# Patient Record
Sex: Female | Born: 1975 | Race: White | Hispanic: No | Smoking: Former smoker
Health system: Southern US, Community
[De-identification: ages and names within clinical notes are randomized; demographics above are authoritative.]

## PROBLEM LIST (undated history)

## (undated) DIAGNOSIS — M436 Torticollis: Secondary | ICD-10-CM

## (undated) DIAGNOSIS — Z973 Presence of spectacles and contact lenses: Secondary | ICD-10-CM

## (undated) DIAGNOSIS — F329 Major depressive disorder, single episode, unspecified: Secondary | ICD-10-CM

## (undated) DIAGNOSIS — IMO0002 Reserved for concepts with insufficient information to code with codable children: Secondary | ICD-10-CM

## (undated) DIAGNOSIS — I341 Nonrheumatic mitral (valve) prolapse: Secondary | ICD-10-CM

## (undated) DIAGNOSIS — I82409 Acute embolism and thrombosis of unspecified deep veins of unspecified lower extremity: Secondary | ICD-10-CM

## (undated) DIAGNOSIS — Z800 Family history of malignant neoplasm of digestive organs: Secondary | ICD-10-CM

## (undated) DIAGNOSIS — J45909 Unspecified asthma, uncomplicated: Secondary | ICD-10-CM

## (undated) DIAGNOSIS — D689 Coagulation defect, unspecified: Secondary | ICD-10-CM

## (undated) DIAGNOSIS — A6000 Herpesviral infection of urogenital system, unspecified: Secondary | ICD-10-CM

## (undated) DIAGNOSIS — M329 Systemic lupus erythematosus, unspecified: Secondary | ICD-10-CM

## (undated) DIAGNOSIS — G2581 Restless legs syndrome: Secondary | ICD-10-CM

## (undated) DIAGNOSIS — Z8041 Family history of malignant neoplasm of ovary: Secondary | ICD-10-CM

## (undated) DIAGNOSIS — Z789 Other specified health status: Secondary | ICD-10-CM

## (undated) DIAGNOSIS — F32A Depression, unspecified: Secondary | ICD-10-CM

## (undated) DIAGNOSIS — F419 Anxiety disorder, unspecified: Secondary | ICD-10-CM

## (undated) HISTORY — PX: NASAL SEPTUM SURGERY: SHX37

## (undated) HISTORY — DX: Depression, unspecified: F32.A

## (undated) HISTORY — DX: Anxiety disorder, unspecified: F41.9

## (undated) HISTORY — DX: Unspecified asthma, uncomplicated: J45.909

## (undated) HISTORY — DX: Herpesviral infection of urogenital system, unspecified: A60.00

## (undated) HISTORY — PX: ENDOMETRIAL BIOPSY: SHX622

## (undated) HISTORY — DX: Major depressive disorder, single episode, unspecified: F32.9

## (undated) HISTORY — PX: ABLATION: SHX5711

## (undated) HISTORY — DX: Family history of malignant neoplasm of digestive organs: Z80.0

## (undated) HISTORY — DX: Coagulation defect, unspecified: D68.9

## (undated) HISTORY — DX: Family history of malignant neoplasm of ovary: Z80.41

## (undated) HISTORY — DX: Reserved for concepts with insufficient information to code with codable children: IMO0002

## (undated) HISTORY — DX: Systemic lupus erythematosus, unspecified: M32.9

## (undated) HISTORY — DX: Acute embolism and thrombosis of unspecified deep veins of unspecified lower extremity: I82.409

---

## 1993-01-10 HISTORY — PX: NOSE SURGERY: SHX723

## 1993-01-10 HISTORY — PX: TONSILLECTOMY: SUR1361

## 2002-01-10 DIAGNOSIS — I82409 Acute embolism and thrombosis of unspecified deep veins of unspecified lower extremity: Secondary | ICD-10-CM

## 2002-01-10 HISTORY — DX: Acute embolism and thrombosis of unspecified deep veins of unspecified lower extremity: I82.409

## 2007-01-08 ENCOUNTER — Ambulatory Visit: Payer: Self-pay | Admitting: Obstetrics & Gynecology

## 2007-01-11 HISTORY — PX: TUBAL LIGATION: SHX77

## 2007-04-03 ENCOUNTER — Ambulatory Visit: Payer: Self-pay | Admitting: Obstetrics & Gynecology

## 2007-05-16 ENCOUNTER — Ambulatory Visit: Payer: Self-pay | Admitting: Family Medicine

## 2007-05-18 ENCOUNTER — Emergency Department: Payer: Self-pay | Admitting: Unknown Physician Specialty

## 2007-05-18 ENCOUNTER — Other Ambulatory Visit: Payer: Self-pay

## 2007-06-25 ENCOUNTER — Ambulatory Visit: Payer: Self-pay | Admitting: Obstetrics & Gynecology

## 2007-06-28 ENCOUNTER — Ambulatory Visit: Payer: Self-pay | Admitting: Obstetrics & Gynecology

## 2009-01-10 HISTORY — PX: COLONOSCOPY: SHX174

## 2009-02-21 ENCOUNTER — Emergency Department: Payer: Self-pay | Admitting: Emergency Medicine

## 2009-10-02 ENCOUNTER — Ambulatory Visit: Payer: Self-pay | Admitting: General Surgery

## 2009-10-05 LAB — PATHOLOGY REPORT

## 2011-05-13 ENCOUNTER — Emergency Department: Payer: Self-pay | Admitting: Emergency Medicine

## 2011-05-13 LAB — CBC
HCT: 39.2 % (ref 35.0–47.0)
MCH: 32.2 pg (ref 26.0–34.0)
Platelet: 265 10*3/uL (ref 150–440)
RBC: 4.02 10*6/uL (ref 3.80–5.20)

## 2011-05-13 LAB — URINALYSIS, COMPLETE
Bacteria: NONE SEEN
Bilirubin,UR: NEGATIVE
Blood: NEGATIVE
Glucose,UR: NEGATIVE mg/dL (ref 0–75)
Ketone: NEGATIVE
Nitrite: NEGATIVE
Ph: 5 (ref 4.5–8.0)
Protein: NEGATIVE
RBC,UR: 4 /HPF (ref 0–5)
WBC UR: 1 /HPF (ref 0–5)

## 2011-05-13 LAB — COMPREHENSIVE METABOLIC PANEL
Albumin: 3.8 g/dL (ref 3.4–5.0)
BUN: 10 mg/dL (ref 7–18)
Chloride: 105 mmol/L (ref 98–107)
Creatinine: 0.77 mg/dL (ref 0.60–1.30)
EGFR (African American): >60
EGFR (Non-African Amer.): >60
Osmolality: 276 (ref 275–301)
Potassium: 3.7 mmol/L (ref 3.5–5.1)
SGPT (ALT): 14 U/L
Total Protein: 7.3 g/dL (ref 6.4–8.2)

## 2011-05-14 LAB — DRUG SCREEN, URINE
Amphetamines, Ur Screen: NEGATIVE (ref ?–1000)
Barbiturates, Ur Screen: NEGATIVE (ref ?–200)
Benzodiazepine, Ur Scrn: NEGATIVE (ref ?–200)
Cannabinoid 50 Ng, Ur ~~LOC~~: NEGATIVE (ref ?–50)
Cocaine Metabolite,Ur ~~LOC~~: NEGATIVE (ref ?–300)
Methadone, Ur Screen: NEGATIVE (ref ?–300)
Opiate, Ur Screen: NEGATIVE (ref ?–300)
Tricyclic, Ur Screen: NEGATIVE (ref ?–1000)

## 2011-05-14 LAB — PREGNANCY, URINE: Pregnancy Test, Urine: NEGATIVE m[IU]/mL

## 2012-12-21 ENCOUNTER — Ambulatory Visit: Payer: Self-pay | Admitting: Family Medicine

## 2013-05-11 ENCOUNTER — Ambulatory Visit: Payer: Self-pay | Admitting: Specialist

## 2013-10-03 DIAGNOSIS — M359 Systemic involvement of connective tissue, unspecified: Secondary | ICD-10-CM | POA: Insufficient documentation

## 2013-10-14 ENCOUNTER — Ambulatory Visit: Payer: Self-pay | Admitting: Gastroenterology

## 2013-10-14 LAB — HM COLONOSCOPY: HM COLON: NORMAL

## 2013-10-16 LAB — PATHOLOGY REPORT

## 2014-12-03 ENCOUNTER — Ambulatory Visit (INDEPENDENT_AMBULATORY_CARE_PROVIDER_SITE_OTHER): Payer: BC Managed Care – PPO | Admitting: Family Medicine

## 2014-12-03 ENCOUNTER — Encounter: Payer: Self-pay | Admitting: Family Medicine

## 2014-12-03 VITALS — BP 116/84 | HR 72 | Temp 98.3°F | Resp 16 | Ht 67.0 in | Wt 195.0 lb

## 2014-12-03 DIAGNOSIS — Z8679 Personal history of other diseases of the circulatory system: Secondary | ICD-10-CM | POA: Insufficient documentation

## 2014-12-03 DIAGNOSIS — G2581 Restless legs syndrome: Secondary | ICD-10-CM

## 2014-12-03 DIAGNOSIS — Z8659 Personal history of other mental and behavioral disorders: Secondary | ICD-10-CM

## 2014-12-03 DIAGNOSIS — D6862 Lupus anticoagulant syndrome: Secondary | ICD-10-CM | POA: Insufficient documentation

## 2014-12-03 DIAGNOSIS — Z86718 Personal history of other venous thrombosis and embolism: Secondary | ICD-10-CM | POA: Insufficient documentation

## 2014-12-03 DIAGNOSIS — Z8709 Personal history of other diseases of the respiratory system: Secondary | ICD-10-CM | POA: Insufficient documentation

## 2014-12-03 DIAGNOSIS — Z8669 Personal history of other diseases of the nervous system and sense organs: Secondary | ICD-10-CM | POA: Insufficient documentation

## 2014-12-03 DIAGNOSIS — I82409 Acute embolism and thrombosis of unspecified deep veins of unspecified lower extremity: Secondary | ICD-10-CM | POA: Insufficient documentation

## 2014-12-03 DIAGNOSIS — Z862 Personal history of diseases of the blood and blood-forming organs and certain disorders involving the immune mechanism: Secondary | ICD-10-CM | POA: Insufficient documentation

## 2014-12-03 MED ORDER — GABAPENTIN 100 MG PO CAPS: 200.0000 mg | ORAL_CAPSULE | Freq: Every day | ORAL | Status: DC

## 2014-12-03 NOTE — Progress Notes (Signed)
Subjective:    Patient ID: Heidi [name], female    DOB: 1975-03-21, 39 y.o.   MRN: [id number]  HPI  Restless Leg Syndrome Pt is currently taking Gabapentin  2 tablets qhs for this problem. Pt has been taking this medication for 3 years. Pt reports significant relief if sx on medication, and reports good tolerance of treatment. Dr. Lavenia Atlas originally prescribed the Gabapentin, but the co-pay to see specialist is too expensive. Pt is requesting that PCP fill this medication. It does work for her RLS.   Does not have Lupus clinically. Doing well.   Does see psychiatry. Mood is stable. Unclear if has had labs done recently.   Review of Systems  Constitutional: Negative for fever, chills, diaphoresis, activity change, appetite change, fatigue and unexpected weight change.  Respiratory: Negative for cough, shortness of breath and wheezing.   Cardiovascular: Negative for chest pain, palpitations and leg swelling.  Psychiatric/Behavioral: Negative.       Patient Active Problem List   Diagnosis Date Noted  . History of anemia 12/03/2014  . History of asthma 12/03/2014  . Deep vein thrombosis (DVT) (HCC) 12/03/2014  . H/O: depression 12/03/2014  . Lupus anticoagulant disorder (HCC) 12/03/2014  . History of migraine headaches 12/03/2014  . H/O cardiovascular disorder 12/03/2014  . Disorder of connective tissue (HCC) 10/03/2013   Past Medical History  Diagnosis Date  . Asthma   . Depression   . Clotting disorder (HCC)     DVT   Current Outpatient Prescriptions on File Prior to Visit  Medication Sig  . gabapentin (NEURONTIN) 100 MG capsule Take 100 mg by mouth 2 (two) times daily.  Marland Kitchen lamoTRIgine (LAMICTAL) 100 MG tablet Take 100 mg by mouth 2 (two) times daily.  . sertraline (ZOLOFT) 100 MG tablet Take 100 mg by mouth. BID   No current facility-administered medications on file prior to visit.   No Known Allergies Past Surgical History  Procedure Laterality Date    . Tubal ligation  2009  . Nose surgery  1995    deviated nose septum  . Tonsillectomy  1995   Social History   Social History  . Marital Status: Divorced    Spouse Name: N/A  . Number of Children: N/A  . Years of Education: N/A   Occupational History  . Not on file.   Social History Main Topics  . Smoking status: Former Smoker    Quit date: 01/09/2013  . Smokeless tobacco: Never Used  . Alcohol Use: Yes     Comment: occasional  . Drug Use: No  . Sexual Activity: Not on file   Other Topics Concern  . Not on file   Social History Narrative   Family History  Problem Relation Age of Onset  . Cancer Mother     colon, rectal  . Diabetes Father        Objective:   Physical Exam  Constitutional: She is oriented to person, place, and time. She appears well-developed and well-nourished.  Neurological: She is alert and oriented to person, place, and time.  Psychiatric: She has a normal mood and affect. Her behavior is normal. Judgment and thought content normal.  BP 116/84 mmHg  Pulse 72  Temp(Src) 98.3 F (36.8 C) (Oral)  Resp 16  Ht  (1.702 m)  Wt 195 lb (88.451 kg)  BMI 30.53 kg/m2  LMP 12/02/2014     Assessment & Plan:  1. Restless leg syndrome Stable.  New problem to this provider.  Diagnosed by rheumatology. Will refill medication as needed.  - gabapentin (NEURONTIN) 100 MG capsule; Take 2 capsules (200 mg total) by mouth at bedtime.  Dispense: 60 capsule; Refill: 11  2. H/O: depression Stable on medication. Recommend check with Gyn and Psychiatrist about what labs need to be done. Will order without seeing patient back.   Lorie PhenixNancy Altagracia Rone, MD

## 2014-12-22 ENCOUNTER — Ambulatory Visit: Payer: Self-pay | Admitting: General Surgery

## 2015-06-02 ENCOUNTER — Ambulatory Visit (INDEPENDENT_AMBULATORY_CARE_PROVIDER_SITE_OTHER): Payer: BC Managed Care – PPO | Admitting: Family Medicine

## 2015-06-02 ENCOUNTER — Encounter: Payer: Self-pay | Admitting: Family Medicine

## 2015-06-02 VITALS — BP 112/80 | HR 65 | Temp 98.2°F | Resp 16 | Wt 190.0 lb

## 2015-06-02 DIAGNOSIS — J4520 Mild intermittent asthma, uncomplicated: Secondary | ICD-10-CM | POA: Diagnosis not present

## 2015-06-02 DIAGNOSIS — J018 Other acute sinusitis: Secondary | ICD-10-CM | POA: Diagnosis not present

## 2015-06-02 DIAGNOSIS — G2581 Restless legs syndrome: Secondary | ICD-10-CM

## 2015-06-02 MED ORDER — AMOXICILLIN-POT CLAVULANATE 875-125 MG PO TABS: 1.0000 | ORAL_TABLET | Freq: Two times a day (BID) | ORAL | Status: DC

## 2015-06-02 MED ORDER — GABAPENTIN 100 MG PO CAPS: 200.0000 mg | ORAL_CAPSULE | Freq: Every day | ORAL | Status: DC

## 2015-06-02 NOTE — Progress Notes (Signed)
Patient ID: Heidi [name], female   DOB: 01/26/1975, 40 y.o.   MRN: [id number]       Patient: Heidi Blamerlizabeth Leaks Female    DOB: 06/30/1975   40 y.o.   MRN: [id number] Visit Date: 06/02/2015  Today's Provider: Lorie PhenixNancy Davey Bergsma, MD   Chief Complaint  Patient presents with  . URI   Subjective:    URI  This is a new problem. The current episode started in the past 7 days. The problem has been gradually worsening. There has been no fever. Associated symptoms include congestion, coughing, headaches, rhinorrhea (green yellow) and sinus pain. She has tried nothing for the symptoms.   Does think she has a sinus infection.  Now has colored mucus. Just not improving as far as sinus symptoms. Was wheezing a lot initially, but not recently.   Did have asthma as a kid.   Has not need an inhaler as a adult.    Restless leg is well controlled.       No Known Allergies Previous Medications   DICLOFENAC (VOLTAREN) 75 MG EC TABLET    Take 1 tablet by mouth 2 (two) times daily.   GABAPENTIN (NEURONTIN) 100 MG CAPSULE    Take 2 capsules (200 mg total) by mouth at bedtime.   LAMOTRIGINE (LAMICTAL) 100 MG TABLET    Take 100 mg by mouth 2 (two) times daily.   LURASIDONE HCL (LATUDA) 60 MG TABS    Take 1 tablet by mouth daily.    SERTRALINE (ZOLOFT) 100 MG TABLET    Take 100 mg by mouth daily.    VALACYCLOVIR (VALTREX) 1000 MG TABLET    TAKE 1 TABLET (1,000 MG) BY ORAL ROUTE ONCE DAILY FOR 30 DAYS    Review of Systems  HENT: Positive for congestion and rhinorrhea (green yellow).   Respiratory: Positive for cough.   Neurological: Positive for headaches.    Social History  Substance Use Topics  . Smoking status: Former Smoker    Quit date: 01/09/2013  . Smokeless tobacco: Never Used  . Alcohol Use: Yes     Comment: occasional   Objective:   BP 112/80 mmHg  Pulse 65  Temp(Src) 98.2 F (36.8 C) (Oral)  Resp 16  Wt 190 lb (86.183 kg)  SpO2 98%  Physical Exam  Constitutional: She appears  well-developed and well-nourished.  HENT:  Head: Normocephalic and atraumatic.  Right Ear: External ear normal.  Left Ear: External ear normal.  Nose: Mucosal edema and rhinorrhea present.  Mouth/Throat: Oropharynx is clear and moist.  Cardiovascular: Normal rate, regular rhythm and normal heart sounds.   Pulmonary/Chest: Effort normal. She has wheezes.  inspiratory       Assessment & Plan:     1. Other acute sinusitis New problem. Worsening. Patient started on amoxicillin-clavulanate 875-125 mg as below. - amoxicillin-clavulanate (AUGMENTIN) 875-125 MG tablet; Take 1 tablet by mouth 2 (two) times daily.  Dispense: 14 tablet; Refill: 0  2. Reactive airway disease, mild intermittent, uncomplicated New problem. Improved. Patient advised to call if symptoms are worsening or not improving.  3. Restless leg syndrome Stable. Patient advised to continue current medication and follow-up in the fall with Dr. Sherrie MustacheFisher.  - gabapentin (NEURONTIN) 100 MG capsule; Take 2 capsules (200 mg total) by mouth at bedtime.  Dispense: 60 capsule; Refill: 5     Patient seen and examined by Dr. Leo GrosserNancy J.. Lafonda Patron, and note scribed by Liz BeachSulibeya S. Dimas, CMA.  I have reviewed the document for accuracy and completeness and I  agree with above. - Jerrell Belfast, MD   Margarita Rana, MD  [facility]

## 2015-11-02 ENCOUNTER — Ambulatory Visit: Payer: BC Managed Care – PPO | Admitting: Family Medicine

## 2015-11-17 ENCOUNTER — Encounter: Payer: Self-pay | Admitting: Family Medicine

## 2015-11-17 ENCOUNTER — Ambulatory Visit (INDEPENDENT_AMBULATORY_CARE_PROVIDER_SITE_OTHER): Payer: BC Managed Care – PPO | Admitting: Family Medicine

## 2015-11-17 VITALS — BP 120/86 | HR 70 | Temp 98.1°F | Resp 16 | Wt 183.0 lb

## 2015-11-17 DIAGNOSIS — G2581 Restless legs syndrome: Secondary | ICD-10-CM

## 2015-11-17 MED ORDER — GABAPENTIN 100 MG PO CAPS: 200.0000 mg | ORAL_CAPSULE | Freq: Every day | ORAL | 5 refills | Status: DC

## 2015-11-17 NOTE — Progress Notes (Signed)
Patient: Heidi [name] Female    DOB: 01/04/1976   40 y.o.   MRN: [id number] Visit Date: 11/17/2015  Today's Provider: Mila Merryonald Illyria Sobocinski, MD   Chief Complaint  Patient presents with  . Follow-up    Restless leg syndrome and history of Depression   Subjective:    HPI  This is a previous patient of Dr. Elease HashimotoMaloney present today as new patient to me to establish care and follow up on chronic medical problems.   Follow up Restless leg syndrome:  Patient was last seen by Dr. Elease HashimotoMaloney for this problem 5 months ago and condition was stable; no changes were made. Patient is currently taking Gabapentin and reports good compliance with treatment, good tolerance and good symptom control.   History of Depression:  Patient was last seen for this problem 5 months ago and no changes were made. Patient was stable on medication and was advised to check with GYN and Psychiatry about what labs need to be done. Patient reports good compliance with treatment, good tolerance and good symptom control. Patient has [place] every 4-6 months and is doing well.     No Known Allergies   Current Outpatient Prescriptions:  .  diclofenac (VOLTAREN) 75 MG EC tablet, Take 1 tablet by mouth 2 (two) times daily., Disp: , Rfl:  .  gabapentin (NEURONTIN) 100 MG capsule, Take 2 capsules (200 mg total) by mouth at bedtime., Disp: 60 capsule, Rfl: 5 .  lamoTRIgine (LAMICTAL) 100 MG tablet, Take 100 mg by mouth 2 (two) times daily., Disp: , Rfl:  .  Lurasidone HCl (LATUDA) 60 MG TABS, Take 0.5 tablets by mouth daily. , Disp: , Rfl:  .  sertraline (ZOLOFT) 100 MG tablet, Take 100 mg by mouth daily. , Disp: , Rfl:  .  valACYclovir (VALTREX) 1000 MG tablet, TAKE 1 TABLET (1,000 MG) BY ORAL ROUTE ONCE DAILY FOR 30 DAYS, Disp: , Rfl: 0  Review of Systems  Constitutional: Negative for appetite change, chills, fatigue and fever.  Respiratory: Negative for chest tightness and shortness of  breath.   Cardiovascular: Negative for chest pain and palpitations.  Gastrointestinal: Negative for abdominal pain, nausea and vomiting.  Musculoskeletal: Negative for arthralgias.       Restless legs  Neurological: Negative for dizziness and weakness.    Social History  Substance Use Topics  . Smoking status: Former Smoker    Quit date: 01/09/2013  . Smokeless tobacco: Never Used  . Alcohol use Yes     Comment: occasional   Objective:   BP 120/86 (BP Location: Right Arm, Patient Position: Sitting, Cuff Size: Large)   Pulse 70   Temp 98.1 F (36.7 C) (Oral)   Resp 16   Wt 183 lb (83 kg)   SpO2 99% Comment: room air  BMI 28.66 kg/m   Physical Exam   General Appearance:    Alert, cooperative, no distress  Eyes:    PERRL, conjunctiva/corneas clear, EOM's intact       Lungs:     Clear to auscultation bilaterally, respirations unlabored  Heart:    Regular rate and rhythm  Neurologic:   Awake, alert, oriented x 3. No apparent focal neurological           defect.           Assessment & Plan:     1. Restless leg syndrome Well controlled on current dose of gabapentin.  - gabapentin (NEURONTIN) 100 MG capsule;  Take 2 capsules (200 mg total) by mouth at bedtime.  Dispense: 60 capsule; Refill: 5       Mila Merryonald Jyl Chico, MD  [facility]

## 2016-03-28 ENCOUNTER — Ambulatory Visit: Payer: Self-pay | Admitting: Obstetrics and Gynecology

## 2016-04-05 ENCOUNTER — Other Ambulatory Visit: Payer: Self-pay | Admitting: Obstetrics and Gynecology

## 2016-04-05 NOTE — Telephone Encounter (Signed)
[facility] on [address]

## 2016-04-05 NOTE — Telephone Encounter (Signed)
Pt called and reschedule appt with AMS for 05/25/16. Pt states she may need refills on prescription before she can make it to schedule appt. [phone]

## 2016-04-05 NOTE — Telephone Encounter (Signed)
I called patient and left her a message regarding I need to know which medications she needs refills of. Pt aware she can call back and leave information with receptionist.

## 2016-05-19 ENCOUNTER — Encounter: Payer: Self-pay | Admitting: Obstetrics and Gynecology

## 2016-05-25 ENCOUNTER — Ambulatory Visit (INDEPENDENT_AMBULATORY_CARE_PROVIDER_SITE_OTHER): Payer: BC Managed Care – PPO | Admitting: Obstetrics and Gynecology

## 2016-05-25 ENCOUNTER — Encounter: Payer: Self-pay | Admitting: Obstetrics and Gynecology

## 2016-05-25 DIAGNOSIS — Z124 Encounter for screening for malignant neoplasm of cervix: Secondary | ICD-10-CM

## 2016-05-25 DIAGNOSIS — Z1239 Encounter for other screening for malignant neoplasm of breast: Secondary | ICD-10-CM

## 2016-05-25 DIAGNOSIS — Z01419 Encounter for gynecological examination (general) (routine) without abnormal findings: Secondary | ICD-10-CM

## 2016-05-25 DIAGNOSIS — Z1231 Encounter for screening mammogram for malignant neoplasm of breast: Secondary | ICD-10-CM

## 2016-05-25 MED ORDER — VALACYCLOVIR HCL 1 G PO TABS: 1000.0000 mg | ORAL_TABLET | Freq: Every day | ORAL | 11 refills | Status: DC

## 2016-05-25 NOTE — Patient Instructions (Signed)
Preventive Care 18-39 Years, Female Preventive care refers to lifestyle choices and visits with your health care provider that can promote health and wellness. What does preventive care include?  A yearly physical exam. This is also called an annual well check.  Dental exams once or twice a year.  Routine eye exams. Ask your health care provider how often you should have your eyes checked.  Personal lifestyle choices, including:  Daily care of your teeth and gums.  Regular physical activity.  Eating a healthy diet.  Avoiding tobacco and drug use.  Limiting alcohol use.  Practicing safe sex.  Taking vitamin and mineral supplements as recommended by your health care provider. What happens during an annual well check? The services and screenings done by your health care provider during your annual well check will depend on your age, overall health, lifestyle risk factors, and family history of disease. Counseling  Your health care provider may ask you questions about your:  Alcohol use.  Tobacco use.  Drug use.  Emotional well-being.  Home and relationship well-being.  Sexual activity.  Eating habits.  Work and work environment.  Method of birth control.  Menstrual cycle.  Pregnancy history. Screening  You may have the following tests or measurements:  Height, weight, and BMI.  Diabetes screening. This is done by checking your blood sugar (glucose) after you have not eaten for a while (fasting).  Blood pressure.  Lipid and cholesterol levels. These may be checked every 5 years starting at age 20.  Skin check.  Hepatitis C blood test.  Hepatitis B blood test.  Sexually transmitted disease (STD) testing.  BRCA-related cancer screening. This may be done if you have a family history of breast, ovarian, tubal, or peritoneal cancers.  Pelvic exam and Pap test. This may be done every 3 years starting at age 21. Starting at age 30, this may be done every 5  years if you have a Pap test in combination with an HPV test. Discuss your test results, treatment options, and if necessary, the need for more tests with your health care provider. Vaccines  Your health care provider may recommend certain vaccines, such as:  Influenza vaccine. This is recommended every year.  Tetanus, diphtheria, and acellular pertussis (Tdap, Td) vaccine. You may need a Td booster every 10 years.  Varicella vaccine. You may need this if you have not been vaccinated.  HPV vaccine. If you are 26 or younger, you may need three doses over 6 months.  Measles, mumps, and rubella (MMR) vaccine. You may need at least one dose of MMR. You may also need a second dose.  Pneumococcal 13-valent conjugate (PCV13) vaccine. You may need this if you have certain conditions and were not previously vaccinated.  Pneumococcal polysaccharide (PPSV23) vaccine. You may need one or two doses if you smoke cigarettes or if you have certain conditions.  Meningococcal vaccine. One dose is recommended if you are age 19-21 years and a first-year college student living in a residence hall, or if you have one of several medical conditions. You may also need additional booster doses.  Hepatitis A vaccine. You may need this if you have certain conditions or if you travel or work in places where you may be exposed to hepatitis A.  Hepatitis B vaccine. You may need this if you have certain conditions or if you travel or work in places where you may be exposed to hepatitis B.  Haemophilus influenzae type b (Hib) vaccine. You may need this   if you have certain risk factors. Talk to your health care provider about which screenings and vaccines you need and how often you need them. This information is not intended to replace advice given to you by your health care provider. Make sure you discuss any questions you have with your health care provider. Document Released: 02/22/2001 Document Revised: 09/16/2015  Document Reviewed: 10/28/2014 Elsevier Interactive Patient Education  2017 [employer].

## 2016-05-25 NOTE — Progress Notes (Signed)
Patient ID: Johny Blamerlizabeth Rabel, female   DOB: 01/26/1975, 41 y.o.   MRN: [id number]     Gynecology Annual Exam  PCP: Malva LimesFisher, Donald E, MD  Chief Complaint:  Chief Complaint  Patient presents with  . Gynecologic Exam    History of Present Illness: Patient is a 41 y.o. [id number] presents for annual exam. The patient has no complaints today.   LMP: Patient's last menstrual period was 05/24/2016 (exact date). Average Interval: regular, 28 days Duration of flow: 5 days Heavy Menses: no Clots: no Intermenstrual Bleeding: no Postcoital Bleeding: no Dysmenorrhea: no   The patient is sexually active. She currently uses BTL for contraception. She denies dyspareunia.  The patient does perform self breast exams.  There is no notable family history of breast or ovarian cancer in her family.  The patient wears seatbelts: yes.   The patient has regular exercise: not asked.    The patient denies current symptoms of depression.    Review of Systems: Review of Systems  Constitutional: Negative for chills and fever.  HENT: Negative for congestion.   Respiratory: Negative for cough and shortness of breath.   Cardiovascular: Negative for chest pain and palpitations.  Gastrointestinal: Negative for abdominal pain, constipation, diarrhea, heartburn, nausea and vomiting.  Genitourinary: Negative for dysuria, frequency and urgency.  Skin: Negative for itching and rash.  Neurological: Negative for dizziness and headaches.  Endo/Heme/Allergies: Negative for polydipsia.  Psychiatric/Behavioral: Negative for depression.    Past Medical History:  Past Medical History:  Diagnosis Date  . Anxiety   . Asthma   . Clotting disorder (HCC)    DVT  . Depression   . DVT (deep venous thrombosis) (HCC)   . Herpes genitalis   . Lupus     Past Surgical History:  Past Surgical History:  Procedure Laterality Date  . ABLATION    . COLONOSCOPY  2011  . ENDOMETRIAL BIOPSY    . NASAL SEPTUM SURGERY    .  NOSE SURGERY  1995   deviated nose septum  . TONSILLECTOMY  1995  . TUBAL LIGATION  2009    Gynecologic History:  Patient's last menstrual period was 05/24/2016 (exact date). Contraception: tubal ligation Last Pap: Results were: NIL and HR HPV negative 06/19/2012 Last mammogram: 10/15/14 Results were: BI-RAD I Obstetric History: [id number]: G3P2012  Family History:  Family History  Problem Relation Age of Onset  . Cancer Mother        colon, rectal  . Diabetes Father     Social History:  Social History   Social History  . Marital status: Divorced    Spouse name: N/A  . Number of children: N/A  . Years of education: N/A   Occupational History  . Not on file.   Social History Main Topics  . Smoking status: Former Smoker    Packs/day: 0.50    Years: 29.00    Quit date: 01/09/2013  . Smokeless tobacco: Never Used  . Alcohol use Yes     Comment: occasional  . Drug use: No  . Sexual activity: Yes    Birth control/ protection: Surgical   Other Topics Concern  . Not on file   Social History Narrative  . No narrative on file    Allergies:  No Known Allergies  Medications: Prior to Admission medications   Medication Sig Start Date End Date Taking? Authorizing Provider  diclofenac (VOLTAREN) 75 MG EC tablet Take 1 tablet by mouth 2 (two) times daily.    [provider]  gabapentin (NEURONTIN) 100 MG capsule Take 2 capsules (200 mg total) by mouth at bedtime. 11/17/15   [name], MD  lamoTRIgine (LAMICTAL) 100 MG tablet Take 100 mg by mouth 2 (two) times daily.    [provider]  Lurasidone HCl (LATUDA) 60 MG TABS Take 0.5 tablets by mouth daily.  10/17/14   [provider]  sertraline (ZOLOFT) 100 MG tablet Take 100 mg by mouth daily.     [provider]  valACYclovir (VALTREX) 1000 MG tablet TAKE 1 TABLET (1,000 MG) BY ORAL ROUTE ONCE DAILY FOR 30 DAYS 10/28/14   [provider]    Physical Exam Vitals: Blood pressure  116/70, pulse 76, height 5\' 8"  (1.727 m), weight 201 lb (91.2 kg), last menstrual period 05/24/2016.  General: NAD HEENT: normocephalic, anicteric Thyroid: no enlargement, no palpable nodules Pulmonary: No increased work of breathing, CTAB Cardiovascular: RRR, distal pulses 2+ Breast: Breast symmetrical, no tenderness, no palpable nodules or masses, no skin or nipple retraction present, no nipple discharge.  No axillary or supraclavicular lymphadenopathy. Abdomen: NABS, soft, non-tender, non-distended.  Umbilicus without lesions.  No hepatomegaly, splenomegaly or masses palpable. No evidence of hernia  Genitourinary:  External: Normal external female genitalia.  Normal urethral meatus, normal  Bartholin's and Skene's glands.    Vagina: Normal vaginal mucosa, no evidence of prolapse.    Cervix: Grossly normal in appearance, no bleeding  Uterus: Non-enlarged, mobile, normal contour.  No CMT  Adnexa: ovaries non-enlarged, no adnexal masses  Rectal: deferred  Lymphatic: no evidence of inguinal lymphadenopathy Extremities: no edema, erythema, or tenderness Neurologic: Grossly intact Psychiatric: mood appropriate, affect full  Female chaperone present for pelvic and breast  portions of the physical exam    Assessment: 41 y.o. [id number] routine annual exam  Plan: Problem List Items Addressed This Visit    None    Visit Diagnoses    Screening for malignant neoplasm of cervix       Relevant Orders   PapIG, HPV, rfx 16/18   Breast screening       Relevant Orders   MM DIGITAL SCREENING BILATERAL   Encounter for gynecological examination without abnormal finding       Relevant Orders   PapIG, HPV, rfx 16/18      1) Mammogram - recommend yearly screening mammogram.  Mammogram Was ordered today  2) STI screening was offered and declined  3) ASCCP guidelines and rational discussed.  Patient opts for every 3 years screening interval  4) Contraception - s/p BTL  5) Routine healthcare  maintenance including cholesterol, diabetes screening discussed managed by PCP   6) Colonoscopy 10/14/13 normal  7) Small dermatofibroma left inner thigh  8) Refill on valtrex  9) RTC 1 year routine annual

## 2016-05-31 LAB — PAPIG, HPV, RFX 16/18
HPV, high-risk: NEGATIVE
PAP SMEAR COMMENT: 0

## 2016-06-07 ENCOUNTER — Encounter: Payer: Self-pay | Admitting: Obstetrics and Gynecology

## 2016-08-08 ENCOUNTER — Encounter: Payer: Self-pay | Admitting: Physician Assistant

## 2016-08-08 ENCOUNTER — Ambulatory Visit (INDEPENDENT_AMBULATORY_CARE_PROVIDER_SITE_OTHER): Payer: BC Managed Care – PPO | Admitting: Physician Assistant

## 2016-08-08 VITALS — BP 110/70 | HR 72 | Temp 98.7°F | Resp 16 | Wt 208.4 lb

## 2016-08-08 DIAGNOSIS — L739 Follicular disorder, unspecified: Secondary | ICD-10-CM

## 2016-08-08 NOTE — Progress Notes (Signed)
       Patient: Heidi [name] Female    DOB: 05/11/1975   41 y.o.   MRN: [id number] Visit Date: 08/08/2016  Today's Provider: Margaretann LovelessJennifer M Haillie Radu, PA-C   Chief Complaint  Patient presents with  . Cyst   Subjective:    HPI Patient is here today with c/o knot on left inner thigh  (groin area). She reports it has been there for 6 months. Sometimes it gets irritated and very painful. IIn the past 6 months it has drained twice during this time. It was very painful over last week but ruptured last Friday night late. This has been improving since then.     No Known Allergies   Current Outpatient Prescriptions:  .  diclofenac (VOLTAREN) 75 MG EC tablet, Take 1 tablet by mouth 2 (two) times daily., Disp: , Rfl:  .  gabapentin (NEURONTIN) 100 MG capsule, Take 2 capsules (200 mg total) by mouth at bedtime., Disp: 60 capsule, Rfl: 5 .  lamoTRIgine (LAMICTAL) 100 MG tablet, Take 100 mg by mouth 2 (two) times daily., Disp: , Rfl:  .  Lurasidone HCl (LATUDA) 60 MG TABS, Take 0.5 tablets by mouth daily. , Disp: , Rfl:  .  sertraline (ZOLOFT) 100 MG tablet, Take 100 mg by mouth daily. , Disp: , Rfl:  .  valACYclovir (VALTREX) 1000 MG tablet, Take 1 tablet (1,000 mg total) by mouth daily., Disp: 30 tablet, Rfl: 11  Review of Systems  Constitutional: Negative for fatigue.  Respiratory: Negative.   Cardiovascular: Negative for chest pain, palpitations and leg swelling.  Skin: Positive for rash and wound.    Social History  Substance Use Topics  . Smoking status: Former Smoker    Packs/day: 0.50    Years: 29.00    Quit date: 01/09/2013  . Smokeless tobacco: Never Used  . Alcohol use Yes     Comment: occasional   Objective:   BP 110/70 (BP Location: Right Arm, Patient Position: Sitting, Cuff Size: Normal)   Pulse 72   Temp 98.7 F (37.1 C) (Oral)   Resp 16   Wt 208 lb 6.4 oz (94.5 kg)   BMI 31.69 kg/m  Vitals:   08/08/16 1059  BP: 110/70  Pulse: 72  Resp: 16  Temp: 98.7 F  (37.1 C)  TempSrc: Oral  Weight: 208 lb 6.4 oz (94.5 kg)     Physical Exam  Constitutional: She appears well-developed and well-nourished.  Skin: Lesion noted.     Vitals reviewed.       Assessment & Plan:     1. Folliculitis Suspect folliculitis that is healing from auto-draining. Advised to start warm compresses and epsom salt soaks. Discussed not shaving the area until healed. Make sure to use clean, sharp razor and shave with the grain of hair, not against. Also discussed not wearing occlusive clothing. She is to call if it becomes infected again.        Margaretann LovelessJennifer M Afrika Brick, PA-C  [facility]

## 2016-08-08 NOTE — Patient Instructions (Signed)

## 2016-11-08 ENCOUNTER — Other Ambulatory Visit: Payer: Self-pay | Admitting: Family Medicine

## 2016-11-08 DIAGNOSIS — G2581 Restless legs syndrome: Secondary | ICD-10-CM

## 2016-11-08 NOTE — Telephone Encounter (Signed)
Pharmacy requesting refills. Thanks!  

## 2016-12-11 ENCOUNTER — Other Ambulatory Visit: Payer: Self-pay | Admitting: Family Medicine

## 2016-12-11 DIAGNOSIS — G2581 Restless legs syndrome: Secondary | ICD-10-CM

## 2017-01-25 ENCOUNTER — Ambulatory Visit: Payer: BC Managed Care – PPO | Admitting: Psychology

## 2017-01-25 DIAGNOSIS — F4321 Adjustment disorder with depressed mood: Secondary | ICD-10-CM | POA: Diagnosis not present

## 2017-02-21 ENCOUNTER — Ambulatory Visit (INDEPENDENT_AMBULATORY_CARE_PROVIDER_SITE_OTHER): Payer: BC Managed Care – PPO | Admitting: Psychology

## 2017-02-21 DIAGNOSIS — F3131 Bipolar disorder, current episode depressed, mild: Secondary | ICD-10-CM

## 2017-03-15 ENCOUNTER — Ambulatory Visit: Payer: BC Managed Care – PPO | Admitting: Psychology

## 2017-03-15 DIAGNOSIS — F3110 Bipolar disorder, current episode manic without psychotic features, unspecified: Secondary | ICD-10-CM

## 2017-04-26 ENCOUNTER — Ambulatory Visit: Payer: BC Managed Care – PPO | Admitting: Psychology

## 2017-04-26 DIAGNOSIS — F3110 Bipolar disorder, current episode manic without psychotic features, unspecified: Secondary | ICD-10-CM

## 2017-05-22 ENCOUNTER — Ambulatory Visit: Payer: BC Managed Care – PPO | Admitting: Psychology

## 2017-05-22 DIAGNOSIS — F3110 Bipolar disorder, current episode manic without psychotic features, unspecified: Secondary | ICD-10-CM

## 2017-06-11 ENCOUNTER — Other Ambulatory Visit: Payer: Self-pay | Admitting: Family Medicine

## 2017-06-11 ENCOUNTER — Other Ambulatory Visit: Payer: Self-pay | Admitting: Obstetrics and Gynecology

## 2017-06-11 DIAGNOSIS — G2581 Restless legs syndrome: Secondary | ICD-10-CM

## 2017-08-05 ENCOUNTER — Other Ambulatory Visit: Payer: Self-pay | Admitting: Family Medicine

## 2017-08-05 DIAGNOSIS — G2581 Restless legs syndrome: Secondary | ICD-10-CM

## 2017-08-24 NOTE — Progress Notes (Signed)
Patient: Heidi [name] Female    DOB: 10/26/1975   42 y.o.   MRN: [id number] Visit Date: 08/25/2017  Today's Provider: Mila Merryonald Fisher, MD   Chief Complaint  Patient presents with  . Annual Exam   Subjective:    HPI Patient present for physical exam today. She is followed by Dr. Bonney AidStaebler for breat and gyn exam. She also needs  health assessment form filled out for work. 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 She has [place] teaching in [place] years. She feels well with no complaints today. Specifically no cough, shortness of breath, fevers, chills, rashes, chest pains or palpitations.    No Known Allergies   Current Outpatient Medications:  .  diclofenac (VOLTAREN) 75 MG EC tablet, Take 1 tablet by mouth 2 (two) times daily., Disp: , Rfl:  .  gabapentin (NEURONTIN) 100 MG capsule, TAKE 2 CAPSULES (200 MG TOTAL) BY MOUTH AT BEDTIME., Disp: 60 capsule, Rfl: 1 .  lamoTRIgine (LAMICTAL) 100 MG tablet, Take 100 mg by mouth 2 (two) times daily., Disp: , Rfl:  .  Lurasidone HCl (LATUDA) 60 MG TABS, Take 0.5 tablets by mouth daily. , Disp: , Rfl:  .  valACYclovir (VALTREX) 1000 MG tablet, TAKE 1 TABLET (1,000 MG TOTAL) BY MOUTH DAILY., Disp: 30 tablet, Rfl: 3 .  sertraline (ZOLOFT) 100 MG tablet, Take 100 mg by mouth daily. , Disp: , Rfl:   Review of Systems  Constitutional: Negative for appetite change, chills, fatigue and fever.  Respiratory: Negative for chest tightness and shortness of breath.   Cardiovascular: Negative for chest pain and palpitations.  Gastrointestinal: Negative for abdominal pain, nausea and vomiting.  Neurological: Negative for dizziness and weakness.   Patient Active Problem List   Diagnosis Date Noted  . History of anemia 12/03/2014  . History of asthma 12/03/2014  . Deep vein thrombosis (DVT) (HCC) 12/03/2014  . H/O: depression 12/03/2014  . Lupus anticoagulant disorder (HCC) 12/03/2014  . History of migraine  headaches 12/03/2014  . H/O cardiovascular disorder 12/03/2014  . Restless leg syndrome 12/03/2014  . Disorder of connective tissue (HCC) 10/03/2013   Past Medical History:  Diagnosis Date  . Anxiety   . Asthma   . Clotting disorder (HCC)    DVT  . Depression   . DVT (deep venous thrombosis) (HCC)   . Herpes genitalis   . Lupus [facility](HCC)    Past Surgical History:  Procedure Laterality Date  . ABLATION    . COLONOSCOPY  2011  . ENDOMETRIAL BIOPSY    . NASAL SEPTUM SURGERY    . NOSE SURGERY  1995   deviated nose septum  . TONSILLECTOMY  1995  . TUBAL LIGATION  2009     Social History   Tobacco Use  . Smoking status: Former Smoker    Packs/day: 0.50    Years: 29.00    Pack years: 14.50    Last attempt to quit: 01/09/2013    Years since quitting: 4.6  . Smokeless tobacco: Never Used  Substance Use Topics  . Alcohol use: Yes    Comment: occasional   Patient Care Team    Relationship Specialty Notifications Start End  Malva LimesFisher, Donald E, MD PCP - General Family Medicine  11/17/15   Kandyce RudKernodle, George W Jr., MD  Rheumatology  11/17/15   Albertina Parr'Neal, Leslie C  Nurse Practitioner  08/25/17   Vena AustriaStaebler, Andreas, MD Referring Physician Obstetrics and Gynecology  08/25/17  Objective:   BP 109/78 (BP Location: Right Arm, Patient Position: Sitting, Cuff Size: Large)   Pulse 84   Temp 97.9 F (36.6 C) (Oral)   Resp 16   Ht 5\' 8"  (1.727 m)   Wt 196 lb (88.9 kg)   SpO2 98% Comment: room air  BMI 29.80 kg/m  Vitals:   08/25/17 0846  BP: 109/78  Pulse: 84  Resp: 16  Temp: 97.9 F (36.6 C)  TempSrc: Oral  SpO2: 98%  Weight: 196 lb (88.9 kg)  Height: 5\' 8"  (1.727 m)     Physical Exam  . General Appearance:    Alert, cooperative, no distress, appears stated age  Head:    Normocephalic, without obvious abnormality, atraumatic  Eyes:    PERRL, conjunctiva/corneas clear, EOM's intact, fundi    benign, both eyes  Ears:    Normal TM's and external ear canals, both ears    Nose:   Nares normal, septum midline, mucosa normal, no drainage    or sinus tenderness  Throat:   Lips, mucosa, and tongue normal; teeth and gums normal  Neck:   Supple, symmetrical, trachea midline, no adenopathy;    thyroid:  no enlargement/tenderness/nodules; no carotid   bruit or JVD  Back:     Symmetric, no curvature, ROM normal, no CVA tenderness  Lungs:     Clear to auscultation bilaterally, respirations unlabored  Chest Wall:    No tenderness or deformity   Heart:    Regular rate and rhythm, S1 and S2 normal, no murmur, rub   or gallop  Breast Exam:    deferred  Abdomen:     Soft, non-tender, bowel sounds active all four quadrants,    no masses, no organomegaly  Pelvic:    deferred  Extremities:   Extremities normal, atraumatic, no cyanosis or edema  Pulses:   2+ and symmetric all extremities  Skin:   Skin color, texture, turgor normal, no rashes or lesions  Lymph nodes:   Cervical, supraclavicular, and axillary nodes normal  Neurologic:   CNII-XII intact, normal strength, sensation and reflexes    throughout       Assessment & Plan:     1. Annual physical exam Pap, breast, gyn exam deferred. Generally doing well. borderline obese.   2. Immunity status testing She does not have childhood immunization records.  - Measles/Mumps/Rubella Immunity  3. Need for Tdap vaccination  - Tdap vaccine greater than or equal to 7yo IM  No restrictions for working for [employer]. Completed health forms for employment.       Mila Merryonald Fisher, MD  [facility]

## 2017-08-25 ENCOUNTER — Ambulatory Visit (INDEPENDENT_AMBULATORY_CARE_PROVIDER_SITE_OTHER): Payer: BC Managed Care – PPO | Admitting: Family Medicine

## 2017-08-25 ENCOUNTER — Encounter: Payer: Self-pay | Admitting: Family Medicine

## 2017-08-25 VITALS — BP 109/78 | HR 84 | Temp 97.9°F | Resp 16 | Ht 68.0 in | Wt 196.0 lb

## 2017-08-25 DIAGNOSIS — Z23 Encounter for immunization: Secondary | ICD-10-CM

## 2017-08-25 DIAGNOSIS — Z0184 Encounter for antibody response examination: Secondary | ICD-10-CM

## 2017-08-25 DIAGNOSIS — Z0000 Encounter for general adult medical examination without abnormal findings: Secondary | ICD-10-CM

## 2017-08-28 LAB — MEASLES/MUMPS/RUBELLA IMMUNITY
MUMPS ABS, IGG: 213 AU/mL (ref >10.9)
RUBELLA: 2.71 {index} (ref >0.99)
RUBEOLA AB, IGG: 129 [AU]/ml (ref >29.9)

## 2017-10-07 ENCOUNTER — Other Ambulatory Visit: Payer: Self-pay | Admitting: Family Medicine

## 2017-10-07 DIAGNOSIS — G2581 Restless legs syndrome: Secondary | ICD-10-CM

## 2017-10-11 ENCOUNTER — Other Ambulatory Visit: Payer: Self-pay | Admitting: Obstetrics and Gynecology

## 2017-10-31 ENCOUNTER — Other Ambulatory Visit: Payer: Self-pay | Admitting: Obstetrics and Gynecology

## 2017-11-06 ENCOUNTER — Other Ambulatory Visit: Payer: Self-pay | Admitting: Obstetrics and Gynecology

## 2017-11-27 ENCOUNTER — Other Ambulatory Visit: Payer: Self-pay

## 2017-11-27 ENCOUNTER — Telehealth: Payer: Self-pay

## 2017-11-27 MED ORDER — VALACYCLOVIR HCL 1 G PO TABS: 1000.0000 mg | ORAL_TABLET | Freq: Every day | ORAL | 3 refills | Status: DC

## 2017-11-27 NOTE — Telephone Encounter (Signed)
Pt has appt on 01/15/18.  Needs refill of valacyclovir. [phone]

## 2017-11-27 NOTE — Telephone Encounter (Signed)
Done

## 2018-01-08 ENCOUNTER — Other Ambulatory Visit: Payer: Self-pay | Admitting: Family Medicine

## 2018-01-08 DIAGNOSIS — G2581 Restless legs syndrome: Secondary | ICD-10-CM

## 2018-01-15 ENCOUNTER — Ambulatory Visit (INDEPENDENT_AMBULATORY_CARE_PROVIDER_SITE_OTHER): Payer: BC Managed Care – PPO | Admitting: Obstetrics and Gynecology

## 2018-01-15 ENCOUNTER — Encounter: Payer: Self-pay | Admitting: Obstetrics and Gynecology

## 2018-01-15 VITALS — BP 120/78 | HR 86 | Ht 68.0 in | Wt 184.0 lb

## 2018-01-15 DIAGNOSIS — Z86010 Personal history of colonic polyps: Secondary | ICD-10-CM

## 2018-01-15 DIAGNOSIS — Z01419 Encounter for gynecological examination (general) (routine) without abnormal findings: Secondary | ICD-10-CM | POA: Diagnosis not present

## 2018-01-15 DIAGNOSIS — Z1239 Encounter for other screening for malignant neoplasm of breast: Secondary | ICD-10-CM

## 2018-01-15 NOTE — Patient Instructions (Signed)
[facility] [address] [place] [address]  [facility] [place]  [address] [place] [address]  Phone: [phone]  

## 2018-01-15 NOTE — Progress Notes (Signed)
Heidi [name]  PCP: Heidi Limes, MD  Chief Complaint:  Chief Complaint  Patient presents with  . Gynecologic [name]    Anxiety/Depression    History of Present Illness: Patient is a 43 y.o. [id number] presents for annual [name]. The patient has no complaints today.   LMP: Patient's last menstrual period was 12/28/2017 (exact date). Average Interval: regular, 28 days Duration of flow: 5 days Heavy Menses: no Clots: no Intermenstrual Bleeding: no Postcoital Bleeding: no Dysmenorrhea: no   The patient is sexually active. She currently uses tubal ligation for contraception. She denies dyspareunia.  The patient does perform self breast exams.  There is no notable family history of breast or ovarian cancer in her family.  The patient wears seatbelts: yes.   The patient has regular exercise: not asked.    The patient denies current symptoms of depression.    Review of Systems: ROS  Past Medical History:  Past Medical History:  Diagnosis Date  . Anxiety   . Asthma   . Clotting disorder (HCC)    DVT  . Depression   . DVT (deep venous thrombosis) (HCC)   . Herpes genitalis   . Lupus [facility])     Past Surgical History:  Past Surgical History:  Procedure Laterality Date  . ABLATION    . COLONOSCOPY  2011  . ENDOMETRIAL BIOPSY    . NASAL SEPTUM SURGERY    . NOSE SURGERY  1995   deviated nose septum  . TONSILLECTOMY  1995  . TUBAL LIGATION  2009    Gynecologic History:  Patient's last menstrual period was 12/28/2017 (exact date). Contraception: tubal ligation Last Pap: Results were:05/25/2016 NIL and HR HPV negative  Last mammogram: 10/15/2014 Results were: BI-RAD I  Obstetric History: [id number]  Family History:  Family History  Problem Relation Age of Onset  . Cancer Mother        colon, rectal  . Diabetes Father     Social History:  Social History   Socioeconomic History  . Marital status: Divorced    Spouse name: Not on file  . Number of  children: Not on file  . Years of education: Not on file  . Highest education level: Not on file  Occupational History  . Not on file  Social Needs  . Financial resource strain: Not on file  . Food insecurity:    Worry: Not on file    Inability: Not on file  . Transportation needs:    Medical: Not on file    Non-medical: Not on file  Tobacco Use  . Smoking status: Former Smoker    Packs/day: 0.50    Years: 29.00    Pack years: 14.50    Last attempt to quit: 01/09/2013    Years since quitting: 5.0  . Smokeless tobacco: Never Used  Substance and Sexual Activity  . Alcohol use: Yes    Comment: occasional  . Drug use: No  . Sexual activity: Yes    Birth control/protection: Surgical  Lifestyle  . Physical activity:    Days per week: Not on file    Minutes per session: Not on file  . Stress: Not on file  Relationships  . Social connections:    Talks on phone: Not on file    Gets together: Not on file    Attends religious service: Not on file    Active member of club or organization: Not on file    Attends meetings of clubs or organizations:  Not on file    Relationship status: Not on file  . Intimate partner violence:    Fear of current or ex partner: Not on file    Emotionally abused: Not on file    Physically abused: Not on file    Forced sexual activity: Not on file  Other Topics Concern  . Not on file  Social History Narrative  . Not on file    Allergies:  No Known Allergies  Medications: Prior to Admission medications   Medication Sig Start Date End Date Taking? Authorizing Provider  diclofenac (VOLTAREN) 75 MG EC tablet Take 1 tablet by mouth 2 (two) times daily.   Yes [provider]  gabapentin (NEURONTIN) 100 MG capsule TAKE 2 CAPSULES (200 MG TOTAL) BY MOUTH AT BEDTIME. NEED APPT FOR MORE REFILLS 01/08/18  Yes Heidi Limes, MD  lamoTRIgine (LAMICTAL) 100 MG tablet Take 100 mg by mouth 2 (two) times daily.   Yes [provider]    valACYclovir (VALTREX) 1000 MG tablet Take 1 tablet (1,000 mg total) by mouth daily. 11/27/17  Yes Vena Austria, MD  Lurasidone HCl (LATUDA) 60 MG TABS Take 0.5 tablets by mouth daily.  10/17/14   [provider]  sertraline (ZOLOFT) 100 MG tablet Take 100 mg by mouth daily.     [provider]    Physical [name] Vitals: Blood pressure 120/78, pulse 86, height 5\' 8"  (1.727 m), weight 184 lb (83.5 kg), last menstrual period 12/28/2017.  General: NAD HEENT: normocephalic, anicteric Thyroid: no enlargement, no palpable nodules Pulmonary: No increased work of breathing, CTAB Cardiovascular: RRR, distal pulses 2+ Breast: Breast symmetrical, no tenderness, no palpable nodules or masses, no skin or nipple retraction present, no nipple discharge.  No axillary or supraclavicular lymphadenopathy. Abdomen: NABS, soft, non-tender, non-distended.  Umbilicus without lesions.  No hepatomegaly, splenomegaly or masses palpable. No evidence of hernia  Genitourinary:  External: Normal external female genitalia.  Normal urethral meatus, normal Bartholin's and Skene's glands.    Vagina: Normal vaginal mucosa, no evidence of prolapse.    Cervix: Grossly normal in appearance, no bleeding  Uterus: Non-enlarged, mobile, normal contour.  No CMT  Adnexa: ovaries non-enlarged, no adnexal masses  Rectal: deferred  Lymphatic: no evidence of inguinal lymphadenopathy Extremities: no edema, erythema, or tenderness Neurologic: Grossly intact Psychiatric: mood appropriate, affect full  Female chaperone present for pelvic and breast  portions of the physical [name]    Assessment: 43 y.o. [id number] routine annual [name]  Plan: Problem List Items Addressed This Visit    None    Visit Diagnoses    Encounter for gynecological examination without abnormal finding    -  Primary   Breast screening       Relevant Orders   MM 3D SCREEN BREAST BILATERAL   Personal history of colonic polyps        Relevant Orders   Ambulatory referral to Gastroenterology      1) Mammogram - recommend yearly screening mammogram.  Mammogram Was ordered today   2) STI screening  was notoffered and therefore not obtained  3) ASCCP guidelines and rational discussed.  Patient opts for every 3 years screening interval  4) Contraception - the patient is currently using  tubal ligation.  She is not currently in need of contraception secondary to being sterile  5) Colonoscopy --  UTD 10/14/2013 - needs this year 5 years  family history personal history of polyps  6) Routine healthcare maintenance including cholesterol, diabetes screening discussed managed  by PCP  7) Return in about 1 year (around 01/16/2019) for annual.   Vena AustriaAndreas Brianny Soulliere, MD, Merlinda FrederickFACOG [facility], [facility] 01/15/2018, 3:30 PM

## 2018-04-01 ENCOUNTER — Other Ambulatory Visit: Payer: Self-pay | Admitting: Obstetrics and Gynecology

## 2018-07-15 ENCOUNTER — Other Ambulatory Visit: Payer: Self-pay | Admitting: Obstetrics and Gynecology

## 2018-07-31 ENCOUNTER — Ambulatory Visit (INDEPENDENT_AMBULATORY_CARE_PROVIDER_SITE_OTHER): Payer: BC Managed Care – PPO | Admitting: Psychology

## 2018-07-31 DIAGNOSIS — F331 Major depressive disorder, recurrent, moderate: Secondary | ICD-10-CM | POA: Diagnosis not present

## 2018-12-11 ENCOUNTER — Other Ambulatory Visit: Payer: Self-pay

## 2018-12-11 DIAGNOSIS — Z20822 Contact with and (suspected) exposure to covid-19: Secondary | ICD-10-CM

## 2018-12-13 LAB — NOVEL CORONAVIRUS, NAA: SARS-CoV-2, NAA: NOT DETECTED

## 2018-12-14 ENCOUNTER — Other Ambulatory Visit: Payer: Self-pay

## 2018-12-14 MED ORDER — VALACYCLOVIR HCL 1 G PO TABS: 1000.0000 mg | ORAL_TABLET | Freq: Every day | ORAL | 1 refills | Status: DC

## 2019-03-10 ENCOUNTER — Ambulatory Visit: Payer: BC Managed Care – PPO | Attending: Internal Medicine

## 2019-03-10 DIAGNOSIS — Z23 Encounter for immunization: Secondary | ICD-10-CM | POA: Insufficient documentation

## 2019-03-10 NOTE — Progress Notes (Signed)
   Covid-19 Vaccination Clinic  Name:  Heidi [name]    MRN: [id number] DOB: 1975/10/09  03/10/2019  [name] was observed post Covid-19 immunization for 15 minutes without incidence. She was provided with Vaccine Information Sheet and instruction to access the V-Safe system.   [name] was instructed to call 911 with any severe reactions post vaccine: Marland Kitchen Difficulty breathing  . Swelling of your face and throat  . A fast heartbeat  . A bad rash all over your body  . Dizziness and weakness    Immunizations Administered    Name Date Dose VIS Date Route   Pfizer COVID-19 Vaccine 03/10/2019  2:03 PM 0.3 mL 12/21/2018 Intramuscular   Manufacturer: [employer], [employer]   Lot: [id number]   NDC: [address]-8

## 2019-04-01 ENCOUNTER — Other Ambulatory Visit: Payer: Self-pay | Admitting: Family Medicine

## 2019-04-01 DIAGNOSIS — G2581 Restless legs syndrome: Secondary | ICD-10-CM

## 2019-04-01 MED ORDER — GABAPENTIN 100 MG PO CAPS: ORAL_CAPSULE | ORAL | 0 refills | Status: DC

## 2019-04-01 NOTE — Telephone Encounter (Signed)
Medication Refill - Medication:  gabapentin (NEURONTIN) 100 MG capsule [[id number]    Preferred Pharmacy (with phone number or street name):  [id number] [place] [address]  [address] [place] [address]  Phone: [phone] Fax: [phone]     Agent: Please be advised that RX refills may take up to 3 business days. We ask that you follow-up with your pharmacy.

## 2019-04-01 NOTE — Telephone Encounter (Signed)
Refill request for Gabapentin; last refill note states pt needs visit for additional refills; contacted pt and informed her of the above; she verbalized understanding, and requests a virtual appt; pt offered and accepted virtual appt with Dr Mila Merry, [facility], 04/05/19 at 0800. Courtesy refill granted to cover pt until appt; will route to office for notification.  Requested Prescriptions  Pending Prescriptions Disp Refills  . gabapentin (NEURONTIN) 100 MG capsule 28 capsule 0    Sig: TAKE 2 CAPSULES (200 MG TOTAL) BY MOUTH AT BEDTIME. NEED APPT FOR MORE REFILLS     Neurology: Anticonvulsants - gabapentin Failed - 04/01/2019 10:41 AM      Failed - Valid encounter within last 12 months    Recent Outpatient Visits          1 year ago Annual physical exam   [facility] [name], MD   2 years ago Folliculitis   [facility] [place], [employer]   3 years ago Restless leg syndrome   [facility] [name], MD   3 years ago Other acute sinusitis   [facility] Lorie Phenix, MD   4 years ago Restless leg syndrome   [facility] Lorie Phenix, MD      Future Appointments            In 4 days Fisher, Demetrios Isaacs, MD [facility], [facility]

## 2019-04-03 ENCOUNTER — Other Ambulatory Visit (HOSPITAL_COMMUNITY)
Admission: RE | Admit: 2019-04-03 | Discharge: 2019-04-03 | Disposition: A | Discharge status: Home / Self Care | Payer: BC Managed Care – PPO | Source: Ambulatory Visit | Attending: Obstetrics and Gynecology | Admitting: Obstetrics and Gynecology

## 2019-04-03 ENCOUNTER — Ambulatory Visit (INDEPENDENT_AMBULATORY_CARE_PROVIDER_SITE_OTHER): Payer: BC Managed Care – PPO | Admitting: Obstetrics and Gynecology

## 2019-04-03 ENCOUNTER — Other Ambulatory Visit: Payer: Self-pay

## 2019-04-03 VITALS — BP 126/74 | Ht 68.0 in | Wt 188.0 lb

## 2019-04-03 DIAGNOSIS — Z1239 Encounter for other screening for malignant neoplasm of breast: Secondary | ICD-10-CM

## 2019-04-03 DIAGNOSIS — Z01419 Encounter for gynecological examination (general) (routine) without abnormal findings: Secondary | ICD-10-CM | POA: Diagnosis not present

## 2019-04-03 DIAGNOSIS — Z124 Encounter for screening for malignant neoplasm of cervix: Secondary | ICD-10-CM

## 2019-04-03 NOTE — Progress Notes (Signed)
Gynecology Annual Exam  PCP: Birdie Sons, MD  Chief Complaint:  Chief Complaint  Patient presents with  . Annual Exam    History of Present Illness: Patient is a 44 y.o. F7P1025 presents for annual exam. The patient has no complaints today.   LMP: Patient's last menstrual period was 03/06/2019. Average Interval: regular, 30 days Duration of flow: 5 days Heavy Menses: no Clots: no Intermenstrual Bleeding: no Postcoital Bleeding: no Dysmenorrhea: no   The patient is sexually active. She currently uses tubal ligation for contraception. She denies dyspareunia.  The patient does perform self breast exams.  There is no notable family history of breast or ovarian cancer in her family.  The patient wears seatbelts: yes.   The patient has regular exercise: not asked.    The patient denies current symptoms of depression.    Review of Systems: Review of Systems  Constitutional: Negative for chills and fever.  HENT: Negative for congestion.   Respiratory: Negative for cough and shortness of breath.   Cardiovascular: Negative for chest pain and palpitations.  Gastrointestinal: Negative for abdominal pain, constipation, diarrhea, heartburn, nausea and vomiting.  Genitourinary: Negative for dysuria, frequency and urgency.  Skin: Negative for itching and rash.  Neurological: Negative for dizziness and headaches.  Endo/Heme/Allergies: Negative for polydipsia.  Psychiatric/Behavioral: Negative for depression.    Past Medical History:  Patient Active Problem List   Diagnosis Date Noted  . History of anemia 12/03/2014  . History of asthma 12/03/2014  . Deep vein thrombosis (DVT) ([place]) 12/03/2014  . H/O: depression 12/03/2014  . Lupus anticoagulant disorder ([place]) 12/03/2014  . History of migraine headaches 12/03/2014  . H/O cardiovascular disorder 12/03/2014  . Restless leg syndrome 12/03/2014  . Disorder of connective tissue ([place]) 10/03/2013    Overview:  a.  Positive  FANA, anti-DNA.   b.  Prior chest pain.   c.  Transient lupus anticoagulant.   d.  Raynaud's.     Past Surgical History:  Past Surgical History:  Procedure Laterality Date  . ABLATION    . COLONOSCOPY  2011  . ENDOMETRIAL BIOPSY    . NASAL SEPTUM SURGERY    . NOSE SURGERY  1995   deviated nose septum  . TONSILLECTOMY  1995  . TUBAL LIGATION  2009    Gynecologic History:  Patient's last menstrual period was 03/06/2019. Contraception: tubal ligation Last Pap: Results were: 05/25/2016 NIL and HR HPV negative    Obstetric History: [id number]  Family History:  Family History  Problem Relation Age of Onset  . Cancer Mother        colon, rectal  . Diabetes Father     Social History:  Social History   Socioeconomic History  . Marital status: Divorced    Spouse name: Not on file  . Number of children: Not on file  . Years of education: Not on file  . Highest education level: Not on file  Occupational History  . Not on file  Tobacco Use  . Smoking status: Former Smoker    Packs/day: 0.50    Years: 29.00    Pack years: 14.50    Quit date: 01/09/2013    Years since quitting: 6.2  . Smokeless tobacco: Never Used  Substance and Sexual Activity  . Alcohol use: Yes    Comment: occasional  . Drug use: No  . Sexual activity: Yes    Birth control/protection: Surgical  Other Topics Concern  . Not on file  Social History Narrative  .  Not on file   Social Determinants of Health   Financial Resource Strain:   . Difficulty of Paying Living Expenses:   Food Insecurity:   . Worried About Programme researcher, broadcasting/film/video in the Last Year:   . Barista in the Last Year:   Transportation Needs:   . Freight forwarder (Medical):   Marland Kitchen Lack of Transportation (Non-Medical):   Physical Activity:   . Days of Exercise per Week:   . Minutes of Exercise per Session:   Stress:   . Feeling of Stress :   Social Connections:   . Frequency of Communication with Friends and Family:    . Frequency of Social Gatherings with Friends and Family:   . Attends Religious Services:   . Active Member of Clubs or Organizations:   . Attends Banker Meetings:   Marland Kitchen Marital Status:   Intimate Partner Violence:   . Fear of Current or Ex-Partner:   . Emotionally Abused:   Marland Kitchen Physically Abused:   . Sexually Abused:     Allergies:  No Known Allergies  Medications: Prior to Admission medications   Medication Sig Start Date End Date Taking? Authorizing Provider  diclofenac (VOLTAREN) 75 MG EC tablet Take 1 tablet by mouth 2 (two) times daily.    [provider]  gabapentin (NEURONTIN) 100 MG capsule TAKE 2 CAPSULES (200 MG TOTAL) BY MOUTH AT BEDTIME. NEED APPT FOR MORE REFILLS 04/01/19   [name], MD  lamoTRIgine (LAMICTAL) 100 MG tablet Take 100 mg by mouth 2 (two) times daily.    [provider]  Lurasidone HCl (LATUDA) 60 MG TABS Take 0.5 tablets by mouth daily.  10/17/14   [provider]  sertraline (ZOLOFT) 100 MG tablet Take 100 mg by mouth daily.     [provider]  valACYclovir (VALTREX) 1000 MG tablet Take 1 tablet (1,000 mg total) by mouth daily. 12/14/18   Vena Austria, MD    Physical Exam Vitals: There were no vitals taken for this visit.  General: NAD HEENT: normocephalic, anicteric Thyroid: no enlargement, no palpable nodules Pulmonary: No increased work of breathing, CTAB Cardiovascular: RRR, distal pulses 2+ Breast: Breast symmetrical, no tenderness, no palpable nodules or masses, no skin or nipple retraction present, no nipple discharge.  No axillary or supraclavicular lymphadenopathy. Abdomen: NABS, soft, non-tender, non-distended.  Umbilicus without lesions.  No hepatomegaly, splenomegaly or masses palpable. No evidence of hernia  Genitourinary:  External: Normal external female genitalia.  Normal urethral meatus, normal Bartholin's and Skene's glands.    Vagina: Normal vaginal mucosa, no evidence of  prolapse.    Cervix: Grossly normal in appearance, no bleeding  Uterus: Non-enlarged, mobile, normal contour.  No CMT  Adnexa: ovaries non-enlarged, no adnexal masses  Rectal: deferred  Lymphatic: no evidence of inguinal lymphadenopathy Extremities: no edema, erythema, or tenderness Neurologic: Grossly intact Psychiatric: mood appropriate, affect full  Female chaperone present for pelvic and breast  portions of the physical exam    Assessment: 44 y.o. [id number] routine annual exam  Plan: Problem List Items Addressed This Visit    None    Visit Diagnoses    Encounter for gynecological examination without abnormal finding    -  Primary   Screening for malignant neoplasm of cervix       Relevant Orders   Cytology - PAP   Breast screening       Relevant Orders   MM 3D SCREEN BREAST BILATERAL  1) Mammogram - recommend yearly screening mammogram.  Mammogram Was ordered today   2) STI screening  was notoffered and therefore not obtained  3) ASCCP guidelines and rational discussed.  Patient opts for every 3 years screening interval  4) Contraception - the patient is currently using  tubal ligation.  She is happy with her current form of contraception and plans to continue  5) Colonoscopy -- Screening recommended starting at age 22 for average risk individuals, age 72 for individuals deemed at increased risk (including African Americans) and recommended to continue until age 70.  For patient age 44-85 individualized approach is recommended.  Gold standard screening is via colonoscopy, Cologuard screening is an acceptable alternative for patient unwilling or unable to undergo colonoscopy.  "Colorectal cancer screening for average?risk adults: 2018 guideline update from the American Cancer Society"CA: A Cancer Journal for Clinicians: Jun 08, 2016   6) Routine healthcare maintenance including cholesterol, diabetes screening discussed managed by PCP  7) Return in about 1 year (around  04/02/2020) for annual.   Vena Austria, MD, [facility], [facility] 04/03/2019, 3:23 PM

## 2019-04-03 NOTE — Patient Instructions (Signed)
[facility] [address] [place] [address]  [facility] [place]  [address] [place] [address]  Phone: [phone]  

## 2019-04-04 ENCOUNTER — Encounter: Payer: Self-pay | Admitting: Family Medicine

## 2019-04-04 NOTE — Progress Notes (Signed)
Virtual Visit via Video Note   Virtual Visit via Video Note  I connected with Heidi [name] on 04/04/19 at  8:40 AM EDT by a video enabled telemedicine application and verified that I am speaking with the correct person using two identifiers. I discussed the limitations of evaluation and management by telemedicine and the availability of in person appointments. The patient expressed understanding and agreed to proceed. Any physical exam findings were limited by the audio and visual communication technology used for this visit.   Location: Patient: home Provider: bfp    Patient: Heidi [name]   DOB: 04-Mar-1975   44 y.o. Female  MRN: [id number] Visit Date: 04/04/2019  Today's Provider: Lelon Huh, MD  Subjective:    Chief Complaint  Patient presents with  . RLS   HPI  Follow up for Restless legs Syndrome:  The patient was last seen for this 2 years ago. Changes made at last visit include none. Current treatment includes Gabapentin.   She reports good compliance with treatment. She feels that condition is Unchanged. She is not having side effects.   She also wishes to discuss her history of possible lupus. Previously had positive ANA and referred to Dr. Jefm Bryant who told her he thought she had lupus, but later told her he didn't think she had lupus base on lab findings. She has constellation of symptoms that make her feel like she probably dose have lupus or some similar auto-immune disorders. Chronic symptoms including reynauds,extreme chronic fatigue, Unprovoked blood clot, frequent rashes on face and neck. She is prescribed diclofenac by Dr. Sabra Heck for  generalized arthralgias and states whenever she stops taking she hurts all over. She would like to be re-evaluated for auto-immune disorder.   Past Medical History:  Diagnosis Date  . Anxiety   . Asthma   . Clotting disorder (HCC)    DVT  . Depression   . DVT (deep venous thrombosis) ([place])   . Herpes  genitalis   . Lupus ([place])      Medications: Outpatient Medications Prior to Visit  Medication Sig Dispense Refill  . diclofenac (VOLTAREN) 75 MG EC tablet Take 1 tablet by mouth 2 (two) times daily.    Marland Kitchen gabapentin (NEURONTIN) 100 MG capsule TAKE 2 CAPSULES (200 MG TOTAL) BY MOUTH AT BEDTIME. NEED APPT FOR MORE REFILLS 28 capsule 0  . lamoTRIgine (LAMICTAL) 100 MG tablet Take 100 mg by mouth 2 (two) times daily.    . valACYclovir (VALTREX) 1000 MG tablet Take 1 tablet (1,000 mg total) by mouth daily. 90 tablet 1  . Lurasidone HCl (LATUDA) 60 MG TABS Take 0.5 tablets by mouth daily.     Marland Kitchen LATUDA 40 MG TABS tablet Take 40 mg by mouth at bedtime.     No facility-administered medications prior to visit.     Review of Systems  Constitutional: Negative for appetite change, chills, fatigue and fever.  Respiratory: Negative for chest tightness and shortness of breath.   Cardiovascular: Positive for chest pain (unchanged from baseline s/p ER evaluation). Negative for palpitations.  Gastrointestinal: Negative for abdominal pain, nausea and vomiting.  Skin: Positive for rash (on neck and arms).  Neurological: Negative for dizziness and weakness.        Objective:    LMP 03/06/2019  BP Readings from Last 3 Encounters:  04/03/19 126/74  01/15/18 120/78  08/25/17 109/78     Physical Exam   Awake, alert, oriented x 3. In no apparent distress  Assessment & Plan:    1. Restless leg syndrome Doing well with current regiment of gabapentin  2. Arthralgia, unspecified joint Unclear history of auto-immune disease, but has constellation of suspicious sx and arthralgias and myalgias have dramatic response to diclofenac which is prescribed by Dr. Sabra Heck - Comprehensive metabolic panel - ANA Direct w/Reflex if Positive - Sed Rate (ESR) - Rheumatoid Factor - CYCLIC CITRUL PEPTIDE ANTIBODY, IGG/IGA - C-reactive protein  3. Other fatigue  - CBC - TSH - Sed Rate (ESR) - CYCLIC  CITRUL PEPTIDE ANTIBODY, IGG/IGA - C-reactive protein  4. Encounter for special screening examination for cardiovascular disorder  - Lipid panel      I discussed the assessment and treatment plan with the patient. The patient was provided an opportunity to ask questions and all were answered. The patient agreed with the plan and demonstrated an understanding of the instructions.   The patient was advised to call back or seek an in-person evaluation if the symptoms worsen or if the condition fails to improve as anticipated.  I provided 12 minutes of non-face-to-face time during this encounter.   Lelon Huh, MD  [facility] [phone] (phone) [phone] (fax)  [place]

## 2019-04-05 ENCOUNTER — Telehealth (INDEPENDENT_AMBULATORY_CARE_PROVIDER_SITE_OTHER): Payer: BC Managed Care – PPO | Admitting: Family Medicine

## 2019-04-05 DIAGNOSIS — R5383 Other fatigue: Secondary | ICD-10-CM

## 2019-04-05 DIAGNOSIS — G2581 Restless legs syndrome: Secondary | ICD-10-CM

## 2019-04-05 DIAGNOSIS — Z136 Encounter for screening for cardiovascular disorders: Secondary | ICD-10-CM

## 2019-04-05 DIAGNOSIS — M2550 Pain in unspecified joint: Secondary | ICD-10-CM

## 2019-04-06 ENCOUNTER — Ambulatory Visit: Payer: Self-pay

## 2019-04-10 ENCOUNTER — Ambulatory Visit: Payer: BC Managed Care – PPO | Attending: Internal Medicine

## 2019-04-10 DIAGNOSIS — Z23 Encounter for immunization: Secondary | ICD-10-CM

## 2019-04-10 NOTE — Progress Notes (Signed)
   Covid-19 Vaccination Clinic  Name:  Heidi [name]    MRN: [id number] DOB: 01/18/1975  04/10/2019  [name] was observed post Covid-19 immunization for 15 minutes without incident. She was provided with Vaccine Information Sheet and instruction to access the V-Safe system.   [name] was instructed to call 911 with any severe reactions post vaccine: Marland Kitchen Difficulty breathing  . Swelling of face and throat  . A fast heartbeat  . A bad rash all over body  . Dizziness and weakness   Immunizations Administered    Name Date Dose VIS Date Route   Pfizer COVID-19 Vaccine 04/10/2019  4:12 PM 0.3 mL 12/21/2018 Intramuscular   Manufacturer: [employer], [employer]   Lot: [id number]   NDC: [address]-2

## 2019-04-11 LAB — CYTOLOGY - PAP
Comment: NEGATIVE
Comment: NEGATIVE
Diagnosis: NEGATIVE
HPV 16: NEGATIVE
HPV 18 / 45: NEGATIVE
High risk HPV: POSITIVE — AB

## 2019-04-11 LAB — LIPID PANEL
Chol/HDL Ratio: 2.8 ratio (ref 0.0–4.4)
Cholesterol, Total: 116 mg/dL (ref 100–199)
HDL: 41 mg/dL (ref >39)
LDL Chol Calc (NIH): 63 mg/dL (ref 0–99)
Triglycerides: 51 mg/dL (ref 0–149)
VLDL Cholesterol Cal: 12 mg/dL (ref 5–40)

## 2019-04-11 LAB — COMPREHENSIVE METABOLIC PANEL
ALT: 8 IU/L (ref 0–32)
AST: 13 IU/L (ref 0–40)
Albumin/Globulin Ratio: 2 (ref 1.2–2.2)
Albumin: 4.3 g/dL (ref 3.8–4.8)
Alkaline Phosphatase: 66 IU/L (ref 39–117)
BUN/Creatinine Ratio: 16 (ref 9–23)
BUN: 12 mg/dL (ref 6–24)
Bilirubin Total: 0.5 mg/dL (ref 0.0–1.2)
CO2: 23 mmol/L (ref 20–29)
Calcium: 9.3 mg/dL (ref 8.7–10.2)
Chloride: 104 mmol/L (ref 96–106)
Creatinine, Ser: 0.77 mg/dL (ref 0.57–1.00)
GFR calc Af Amer: 108 mL/min/{1.73_m2} (ref >59)
GFR calc non Af Amer: 94 mL/min/{1.73_m2} (ref >59)
Globulin, Total: 2.1 g/dL (ref 1.5–4.5)
Glucose: 96 mg/dL (ref 65–99)
Potassium: 4.7 mmol/L (ref 3.5–5.2)
Sodium: 140 mmol/L (ref 134–144)
Total Protein: 6.4 g/dL (ref 6.0–8.5)

## 2019-04-11 LAB — TSH: TSH: 1.69 u[IU]/mL (ref 0.450–4.500)

## 2019-04-11 LAB — CBC
Hematocrit: 36.9 % (ref 34.0–46.6)
Hemoglobin: 12.8 g/dL (ref 11.1–15.9)
MCH: 35.1 pg — ABNORMAL HIGH (ref 26.6–33.0)
MCHC: 34.7 g/dL (ref 31.5–35.7)
MCV: 101 fL — ABNORMAL HIGH (ref 79–97)
Platelets: 303 10*3/uL (ref 150–450)
RBC: 3.65 x10E6/uL — ABNORMAL LOW (ref 3.77–5.28)
RDW: 11.8 % (ref 11.7–15.4)
WBC: 6.6 10*3/uL (ref 3.4–10.8)

## 2019-04-11 LAB — ANA W/REFLEX IF POSITIVE: Anti Nuclear Antibody (ANA): NEGATIVE

## 2019-04-11 LAB — RHEUMATOID FACTOR: Rheumatoid fact SerPl-aCnc: <10 IU/mL (ref 0.0–13.9)

## 2019-04-11 LAB — CYCLIC CITRUL PEPTIDE ANTIBODY, IGG/IGA: Cyclic Citrullin Peptide Ab: 2 units (ref 0–19)

## 2019-04-11 LAB — C-REACTIVE PROTEIN: CRP: 2 mg/L (ref 0–10)

## 2019-04-11 LAB — SEDIMENTATION RATE: Sed Rate: 5 mm/hr (ref 0–32)

## 2019-05-05 ENCOUNTER — Other Ambulatory Visit: Payer: Self-pay

## 2019-05-05 ENCOUNTER — Encounter: Payer: Self-pay | Admitting: Family Medicine

## 2019-05-05 DIAGNOSIS — G2581 Restless legs syndrome: Secondary | ICD-10-CM

## 2019-05-06 MED ORDER — GABAPENTIN 100 MG PO CAPS: ORAL_CAPSULE | ORAL | 0 refills | Status: DC

## 2019-05-07 ENCOUNTER — Encounter: Payer: Self-pay | Admitting: Obstetrics and Gynecology

## 2019-05-19 ENCOUNTER — Encounter: Payer: Self-pay | Admitting: Family Medicine

## 2019-05-21 ENCOUNTER — Other Ambulatory Visit: Payer: Self-pay | Admitting: Family Medicine

## 2019-05-21 DIAGNOSIS — G2581 Restless legs syndrome: Secondary | ICD-10-CM

## 2019-05-21 MED ORDER — GABAPENTIN 100 MG PO CAPS: 200.0000 mg | ORAL_CAPSULE | Freq: Two times a day (BID) | ORAL | 4 refills | Status: DC

## 2019-05-21 MED ORDER — GABAPENTIN 100 MG PO CAPS: 200.0000 mg | ORAL_CAPSULE | Freq: Two times a day (BID) | ORAL | 0 refills | Status: DC

## 2019-06-15 ENCOUNTER — Other Ambulatory Visit: Payer: Self-pay | Admitting: Obstetrics and Gynecology

## 2019-07-08 ENCOUNTER — Encounter: Payer: Self-pay | Admitting: Family Medicine

## 2020-03-05 ENCOUNTER — Other Ambulatory Visit: Payer: Self-pay | Admitting: Obstetrics and Gynecology

## 2020-03-05 NOTE — Telephone Encounter (Signed)
Please advise 

## 2020-03-17 ENCOUNTER — Encounter: Payer: Self-pay | Admitting: Family Medicine

## 2020-03-17 NOTE — Telephone Encounter (Signed)
Patient needs to schedule office visit to have these problems evaluated.

## 2020-04-03 ENCOUNTER — Other Ambulatory Visit: Payer: Self-pay

## 2020-04-03 ENCOUNTER — Ambulatory Visit: Payer: BC Managed Care – PPO | Admitting: Family Medicine

## 2020-04-03 ENCOUNTER — Encounter: Payer: Self-pay | Admitting: Family Medicine

## 2020-04-03 VITALS — BP 126/76 | HR 79 | Resp 16 | Ht 68.0 in | Wt 188.0 lb

## 2020-04-03 DIAGNOSIS — E559 Vitamin D deficiency, unspecified: Secondary | ICD-10-CM

## 2020-04-03 DIAGNOSIS — G2581 Restless legs syndrome: Secondary | ICD-10-CM

## 2020-04-03 DIAGNOSIS — E538 Deficiency of other specified B group vitamins: Secondary | ICD-10-CM

## 2020-04-03 DIAGNOSIS — F3181 Bipolar II disorder: Secondary | ICD-10-CM

## 2020-04-03 DIAGNOSIS — R5383 Other fatigue: Secondary | ICD-10-CM | POA: Diagnosis not present

## 2020-04-03 DIAGNOSIS — R21 Rash and other nonspecific skin eruption: Secondary | ICD-10-CM | POA: Diagnosis not present

## 2020-04-03 DIAGNOSIS — G894 Chronic pain syndrome: Secondary | ICD-10-CM

## 2020-04-03 DIAGNOSIS — D6862 Lupus anticoagulant syndrome: Secondary | ICD-10-CM

## 2020-04-03 DIAGNOSIS — R251 Tremor, unspecified: Secondary | ICD-10-CM

## 2020-04-03 NOTE — Progress Notes (Signed)
Established patient visit   Patient: Heidi [name]   DOB: 1975-02-07   45 y.o. Female  MRN: [id number] Visit Date: 04/03/2020  Today's healthcare provider: Mila Merry, MD   Chief Complaint  Patient presents with  . Follow-up   Subjective    HPI  Follow up for restless legs syndrome  The patient was last seen for this 1 years ago. Changes made at last visit include continuing gabapentin.  She reports good compliance with treatment. She feels that condition is Unchanged. She is not having side effects.   Patient also mentions that her hands shake a lot, and have for at least the better part of a year. Headaches have returned. Feeling exhausted all the time, and whole body hurts despite the continuation of the diclofenac. Has had long list of physical complains for years with no definitive diagnosis. Was followed by Dr. Gavin Potters for suspects lupus at one point but didn't improved with treatment and follow up ANA was negative. Had extensive autoimmune labs last year which were negative. States hurts all over like a burning, but no painful in muscles. Has trouble sleeping off an on. Her psychiatrist prescribed hs Zyprexa for sleep, but she state her RLS became much more severe while taking it. Has also had persistent red rash upper chest and neck.       Medications: Outpatient Medications Prior to Visit  Medication Sig  . Cholecalciferol (VITAMIN D3) 1.25 MG (50000 UT) CAPS Take 1 capsule by mouth once a week.  . diclofenac (VOLTAREN) 75 MG EC tablet Take 1 tablet by mouth 2 (two) times daily.  Marland Kitchen gabapentin (NEURONTIN) 100 MG capsule Take 2 capsules (200 mg total) by mouth 2 (two) times daily. PLEASE NOTE CHANGE IN QUANTITY TO 90 DAY SUPPLY  . lamoTRIgine (LAMICTAL) 100 MG tablet Take 100 mg by mouth 2 (two) times daily.  Marland Kitchen lithium carbonate (LITHOBID) 300 MG CR tablet Take 300 mg by mouth at bedtime.  . valACYclovir (VALTREX) 1000 MG tablet TAKE 1 TABLET BY MOUTH EVERY  DAY  . LATUDA 40 MG TABS tablet Take 40 mg by mouth at bedtime. (Patient not taking: Reported on 04/03/2020)   No facility-administered medications prior to visit.    Review of Systems  Constitutional: Positive for appetite change and fatigue.  Respiratory: Negative for cough and shortness of breath.   Cardiovascular: Negative for chest pain, palpitations and leg swelling.  Endocrine: Negative for cold intolerance, heat intolerance, polydipsia and polyphagia.  Musculoskeletal: Positive for arthralgias, joint swelling, myalgias, neck pain and neck stiffness.  Neurological: Positive for tremors. Negative for dizziness and headaches.       Objective    BP 126/76   Pulse 79   Resp 16   Ht 5\' 8"  (1.727 m)   Wt 188 lb (85.3 kg)   BMI 28.59 kg/m     Physical Exam    General: Appearance:     Overweight female in no acute distress  Eyes:    PERRL, conjunctiva/corneas clear, EOM's intact       Lungs:     Clear to auscultation bilaterally, respirations unlabored  Heart:    Normal heart rate. Normal rhythm. No murmurs, rubs, or gallops.   MS:   All extremities are intact.   Neurologic:   Awake, alert, oriented x 3. No apparent focal neurological           defect.         Assessment & Plan  1. Other fatigue Denies signs of sleep apnea.  - Comprehensive metabolic panel  2. Restless leg syndrome Gabapentin somewhat effective.  - Ferritin  3. Chronic pain syndrome   4. Rash  - C-reactive protein - Lyme Ab/Western Blot Reflex  5. Lupus anticoagulant disorder (HCC)  - C-reactive protein - ANA w/Reflex if Positive - CBC with Differential/Platelet  6. Tremors of nervous system  - TSH - T4, free  7. Bipolar 2 disorder (HCC)  - Lithium level  8. Vitamin D deficiency  - VITAMIN D 25 Hydroxy (Vit-D Deficiency, Fractures)  9. B12 deficiency  - Vitamin B12      The entirety of the information documented in the History of Present Illness, Review of Systems  and Physical Exam were personally obtained by me. Portions of this information were initially documented by the CMA and reviewed by me for thoroughness and accuracy.      Mila Merry, MD  [facility] [phone] (phone) [phone] (fax)  [facility]

## 2020-04-06 LAB — CBC WITH DIFFERENTIAL/PLATELET
Basophils Absolute: 0.1 10*3/uL (ref 0.0–0.2)
Basos: 1 %
EOS (ABSOLUTE): 0.1 10*3/uL (ref 0.0–0.4)
Eos: 1 %
Hematocrit: 38.8 % (ref 34.0–46.6)
Hemoglobin: 13.4 g/dL (ref 11.1–15.9)
Immature Grans (Abs): 0 10*3/uL (ref 0.0–0.1)
Immature Granulocytes: 0 %
Lymphocytes Absolute: 2.7 10*3/uL (ref 0.7–3.1)
Lymphs: 30 %
MCH: 33.9 pg — ABNORMAL HIGH (ref 26.6–33.0)
MCHC: 34.5 g/dL (ref 31.5–35.7)
MCV: 98 fL — ABNORMAL HIGH (ref 79–97)
Monocytes Absolute: 0.5 10*3/uL (ref 0.1–0.9)
Monocytes: 6 %
Neutrophils Absolute: 5.6 10*3/uL (ref 1.4–7.0)
Neutrophils: 62 %
Platelets: 368 10*3/uL (ref 150–450)
RBC: 3.95 x10E6/uL (ref 3.77–5.28)
RDW: 11.5 % — ABNORMAL LOW (ref 11.7–15.4)
WBC: 9 10*3/uL (ref 3.4–10.8)

## 2020-04-06 LAB — COMPREHENSIVE METABOLIC PANEL WITH GFR
ALT: 11 IU/L (ref 0–32)
AST: 13 IU/L (ref 0–40)
Albumin/Globulin Ratio: 1.8 (ref 1.2–2.2)
Albumin: 4.4 g/dL (ref 3.8–4.8)
Alkaline Phosphatase: 59 IU/L (ref 44–121)
BUN/Creatinine Ratio: 10 (ref 9–23)
BUN: 8 mg/dL (ref 6–24)
Bilirubin Total: 0.5 mg/dL (ref 0.0–1.2)
CO2: 24 mmol/L (ref 20–29)
Calcium: 9.4 mg/dL (ref 8.7–10.2)
Chloride: 102 mmol/L (ref 96–106)
Creatinine, Ser: 0.8 mg/dL (ref 0.57–1.00)
Globulin, Total: 2.4 g/dL (ref 1.5–4.5)
Glucose: 114 mg/dL — ABNORMAL HIGH (ref 65–99)
Potassium: 3.8 mmol/L (ref 3.5–5.2)
Sodium: 142 mmol/L (ref 134–144)
Total Protein: 6.8 g/dL (ref 6.0–8.5)
eGFR: 92 mL/min/1.73 (ref >59)

## 2020-04-06 LAB — VITAMIN D 25 HYDROXY (VIT D DEFICIENCY, FRACTURES): Vit D, 25-Hydroxy: 56.8 ng/mL (ref 30.0–100.0)

## 2020-04-06 LAB — LYME AB/WESTERN BLOT REFLEX
LYME DISEASE AB, QUANT, IGM: <0.8 index (ref 0.00–0.79)
Lyme IgG/IgM Ab: <0.91 {ISR} (ref 0.00–0.90)

## 2020-04-06 LAB — T4, FREE: Free T4: 1.37 ng/dL (ref 0.82–1.77)

## 2020-04-06 LAB — VITAMIN B12: Vitamin B-12: 183 pg/mL — ABNORMAL LOW (ref 232–1245)

## 2020-04-06 LAB — C-REACTIVE PROTEIN: CRP: <1 mg/L (ref 0–10)

## 2020-04-06 LAB — FERRITIN: Ferritin: 30 ng/mL (ref 15–150)

## 2020-04-06 LAB — TSH: TSH: 1.13 u[IU]/mL (ref 0.450–4.500)

## 2020-04-06 LAB — ANA W/REFLEX IF POSITIVE: Anti Nuclear Antibody (ANA): NEGATIVE

## 2020-04-06 LAB — LITHIUM LEVEL: Lithium Lvl: 0.2 mmol/L — ABNORMAL LOW (ref 0.5–1.2)

## 2020-04-08 ENCOUNTER — Encounter: Payer: Self-pay | Admitting: Family Medicine

## 2020-04-08 DIAGNOSIS — E538 Deficiency of other specified B group vitamins: Secondary | ICD-10-CM | POA: Insufficient documentation

## 2020-05-25 ENCOUNTER — Encounter: Payer: Self-pay | Admitting: Family Medicine

## 2020-05-25 DIAGNOSIS — E538 Deficiency of other specified B group vitamins: Secondary | ICD-10-CM

## 2020-06-04 ENCOUNTER — Emergency Department
Admission: EM | Admit: 2020-06-04 | Discharge: 2020-06-05 | Disposition: A | Discharge status: Home / Self Care | Payer: BC Managed Care – PPO | Attending: Emergency Medicine | Admitting: Emergency Medicine

## 2020-06-04 ENCOUNTER — Emergency Department: Payer: BC Managed Care – PPO

## 2020-06-04 ENCOUNTER — Other Ambulatory Visit: Payer: Self-pay

## 2020-06-04 DIAGNOSIS — Z87891 Personal history of nicotine dependence: Secondary | ICD-10-CM | POA: Insufficient documentation

## 2020-06-04 DIAGNOSIS — R1030 Lower abdominal pain, unspecified: Secondary | ICD-10-CM | POA: Insufficient documentation

## 2020-06-04 DIAGNOSIS — J45909 Unspecified asthma, uncomplicated: Secondary | ICD-10-CM | POA: Diagnosis not present

## 2020-06-04 LAB — CBC
HCT: 36 % (ref 36.0–46.0)
Hemoglobin: 12.4 g/dL (ref 12.0–15.0)
MCH: 35.2 pg — ABNORMAL HIGH (ref 26.0–34.0)
MCHC: 34.4 g/dL (ref 30.0–36.0)
MCV: 102.3 fL — ABNORMAL HIGH (ref 80.0–100.0)
Platelets: 288 10*3/uL (ref 150–400)
RBC: 3.52 MIL/uL — ABNORMAL LOW (ref 3.87–5.11)
RDW: 11.7 % (ref 11.5–15.5)
WBC: 9.1 10*3/uL (ref 4.0–10.5)
nRBC: 0 % (ref 0.0–0.2)

## 2020-06-04 LAB — COMPREHENSIVE METABOLIC PANEL
ALT: 15 U/L (ref 0–44)
AST: 13 U/L — ABNORMAL LOW (ref 15–41)
Albumin: 4.1 g/dL (ref 3.5–5.0)
Alkaline Phosphatase: 47 U/L (ref 38–126)
Anion gap: 7 (ref 5–15)
BUN: 11 mg/dL (ref 6–20)
CO2: 26 mmol/L (ref 22–32)
Calcium: 9.2 mg/dL (ref 8.9–10.3)
Chloride: 106 mmol/L (ref 98–111)
Creatinine, Ser: 0.63 mg/dL (ref 0.44–1.00)
GFR, Estimated: >60 mL/min (ref >60)
Glucose, Bld: 107 mg/dL — ABNORMAL HIGH (ref 70–99)
Potassium: 4.1 mmol/L (ref 3.5–5.1)
Sodium: 139 mmol/L (ref 135–145)
Total Bilirubin: 0.7 mg/dL (ref 0.3–1.2)
Total Protein: 7 g/dL (ref 6.5–8.1)

## 2020-06-04 LAB — URINALYSIS, COMPLETE (UACMP) WITH MICROSCOPIC
Bacteria, UA: NONE SEEN
Bilirubin Urine: NEGATIVE
Glucose, UA: NEGATIVE mg/dL
Hgb urine dipstick: NEGATIVE
Ketones, ur: NEGATIVE mg/dL
Leukocytes,Ua: NEGATIVE
Nitrite: NEGATIVE
Protein, ur: NEGATIVE mg/dL
Specific Gravity, Urine: 1.005 (ref 1.005–1.030)
pH: 5 (ref 5.0–8.0)

## 2020-06-04 LAB — POC URINE PREG, ED: Preg Test, Ur: NEGATIVE

## 2020-06-04 LAB — LIPASE, BLOOD: Lipase: 25 U/L (ref 11–51)

## 2020-06-04 NOTE — ED Provider Notes (Signed)
[facility] Emergency Department Provider Note  ____________________________________________   Event Date/Time   First MD Initiated Contact with Patient 06/04/20 2303     (approximate)  I have reviewed the triage vital signs and the nursing notes.   HISTORY  Chief Complaint Abdominal Pain    HPI Heidi [name] is a 45 y.o. female with DVT back in 2004 not on anticoagulation, possible lupus who comes in with abdominal pain.  Patient reports mid abdominal pain that started at 1430 today.  Patient states the pain is in her lower abdomen.  It is a sharp stabbing sensation that is worse with movements.  The pain is nonradiating.  It is constant.  Denies taking anything to help with the pain.  She states the pain currently is very minimal.  She denies any constipation, nausea, vomiting, dysuria.  No vaginal symptoms.  No concern for STDs.  She denies any missed periods.  Denies any prior abdominal surgeries.    Past Medical History:  Diagnosis Date  . Anxiety   . Asthma   . Clotting disorder (HCC)    DVT  . Depression   . DVT (deep venous thrombosis) (HCC)   . Family history of colon cancer in mother   . Family history of ovarian cancer    4/21 cancer genetic tesitng letter sent  . Herpes genitalis   . Lupus [facility])     Patient Active Problem List   Diagnosis Date Noted  . B12 deficiency 04/08/2020  . History of anemia 12/03/2014  . History of asthma 12/03/2014  . History of DVT (deep vein thrombosis) 12/03/2014  . H/O: depression 12/03/2014  . Lupus anticoagulant disorder (HCC) 12/03/2014  . History of migraine headaches 12/03/2014  . H/O cardiovascular disorder 12/03/2014  . Restless leg syndrome 12/03/2014  . Disorder of connective tissue (HCC) 10/03/2013    Past Surgical History:  Procedure Laterality Date  . ABLATION    . COLONOSCOPY  2011  . ENDOMETRIAL BIOPSY    . NASAL SEPTUM SURGERY    . NOSE SURGERY  1995   deviated nose septum   . TONSILLECTOMY  1995  . TUBAL LIGATION  2009    Prior to Admission medications   Medication Sig Start Date End Date Taking? Authorizing Provider  Cholecalciferol (VITAMIN D3) 1.25 MG (50000 UT) CAPS Take 1 capsule by mouth once a week. 02/22/20   [provider]  diclofenac (VOLTAREN) 75 MG EC tablet Take 1 tablet by mouth 2 (two) times daily.    [provider]  gabapentin (NEURONTIN) 100 MG capsule Take 2 capsules (200 mg total) by mouth 2 (two) times daily. PLEASE NOTE CHANGE IN QUANTITY TO 90 DAY SUPPLY 05/21/19   [name], MD  lamoTRIgine (LAMICTAL) 100 MG tablet Take 100 mg by mouth 2 (two) times daily.    [provider]  lithium carbonate (LITHOBID) 300 MG CR tablet Take 300 mg by mouth at bedtime. 03/26/20   [provider]  valACYclovir (VALTREX) 1000 MG tablet TAKE 1 TABLET BY MOUTH EVERY DAY 03/05/20   Vena Austria, MD    Allergies Zyprexa [olanzapine]  Family History  Problem Relation Age of Onset  . Cancer Mother 84       colon, rectal  . Diabetes Father   . Ovarian cancer Maternal Aunt 50       vs uterine?    Social History Social History   Tobacco Use  . Smoking status: Former Smoker    Packs/day:  0.50    Years: 29.00    Pack years: 7114.50    Quit date: 01/09/2013    Years since quitting: 7.4  . Smokeless tobacco: Never Used  Vaping Use  . Vaping Use: Never used  Substance Use Topics  . Alcohol use: Yes    Comment: occasional  . Drug use: No      Review of Systems Constitutional: No fever/chills Eyes: No visual changes. ENT: No sore throat. Cardiovascular: Denies chest pain. Respiratory: Denies shortness of breath. Gastrointestinal: Positive abdominal pain.  No nausea, no vomiting.  No diarrhea.  No constipation. Genitourinary: Negative for dysuria. Musculoskeletal: Negative for back pain. Skin: Negative for rash. Neurological: Negative for headaches, focal weakness or numbness. All other ROS  negative ____________________________________________   PHYSICAL EXAM:  VITAL SIGNS: ED Triage Vitals  Enc Vitals Group     BP 06/04/20 1844 128/79     Pulse Rate 06/04/20 1840 62     Resp 06/04/20 1840 18     Temp 06/04/20 1840 98.6 F (37 C)     Temp Source 06/04/20 1840 Oral     SpO2 06/04/20 1840 100 %     Weight 06/04/20 1841 191 lb (86.6 kg)     Height 06/04/20 1841 5\' 8"  (1.727 m)     Head Circumference --      Peak Flow --      Pain Score 06/04/20 1841 3     Pain Loc --      Pain Edu? --      Excl. in GC? --     Constitutional: Alert and oriented. Well appearing and in no acute distress. Eyes: Conjunctivae are normal. EOMI. Head: Atraumatic. Nose: No congestion/rhinnorhea. Mouth/Throat: Mucous membranes are moist.   Neck: No stridor. Trachea Midline. FROM Cardiovascular: Normal rate, regular rhythm. Grossly normal heart sounds.  Good peripheral circulation. Respiratory: Normal respiratory effort.  No retractions. Lungs CTAB. Gastrointestinal: Mild tenderness to the lower abdomen no distention. No abdominal bruits.  Musculoskeletal: No lower extremity tenderness nor edema.  No joint effusions. Neurologic:  Normal speech and language. No gross focal neurologic deficits are appreciated.  Skin:  Skin is warm, dry and intact. No rash noted. Psychiatric: Mood and affect are normal. Speech and behavior are normal. GU: Deferred   ____________________________________________   LABS (all labs ordered are listed, but only abnormal results are displayed)  Labs Reviewed  COMPREHENSIVE METABOLIC PANEL - Abnormal; Notable for the following components:      Result Value   Glucose, Bld 107 (*)    AST 13 (*)    All other components within normal limits  CBC - Abnormal; Notable for the following components:   RBC 3.52 (*)    MCV 102.3 (*)    MCH 35.2 (*)    All other components within normal limits  URINALYSIS, COMPLETE (UACMP) WITH MICROSCOPIC - Abnormal; Notable for  the following components:   Color, Urine STRAW (*)    APPearance CLEAR (*)    All other components within normal limits  LIPASE, BLOOD  CBC WITH DIFFERENTIAL/PLATELET  BASIC METABOLIC PANEL  POC URINE PREG, ED   ____________________________________________  RADIOLOGY   Official radiology report(s): CT ABDOMEN PELVIS WO CONTRAST  Result Date: 06/04/2020 CLINICAL DATA:  45 year old female with abdominal distension. EXAM: CT ABDOMEN AND PELVIS WITHOUT CONTRAST TECHNIQUE: Multidetector CT imaging of the abdomen and pelvis was performed following the standard protocol without IV contrast. COMPARISON:  CT chest abdomen pelvis dated 05/19/2007. FINDINGS: Evaluation of this exam  is limited in the absence of intravenous contrast. Lower chest: The visualized lung bases are clear. No intra-abdominal free air or free fluid. Hepatobiliary: No focal liver abnormality is seen. No gallstones, gallbladder wall thickening, or biliary dilatation. Pancreas: Unremarkable. No pancreatic ductal dilatation or surrounding inflammatory changes. Spleen: Normal in size without focal abnormality. Adrenals/Urinary Tract: The adrenal glands unremarkable. Kidneys, visualized ureters, and urinary bladder appear unremarkable. Stomach/Bowel: Small scattered sigmoid diverticula without active inflammatory changes there is no bowel obstruction or active inflammation. The appendix is normal. Vascular/Lymphatic: The abdominal aorta and IVC unremarkable. No portal venous gas. There is no adenopathy. Reproductive: The uterus is grossly unremarkable. Bilateral adnexal tubal ligation clips and occlusive device is noted. The ovaries are grossly unremarkable Other: Small fat containing umbilical hernia. No fluid collection or inflammatory changes. Musculoskeletal: Degenerative changes at L5-S1. No acute osseous pathology. IMPRESSION: 1. No acute intra-abdominal or pelvic pathology. No bowel obstruction. Normal appendix. 2. Small scattered  sigmoid diverticula. Electronically Signed   By: Elgie Collard M.D.   On: 06/04/2020 23:38    ____________________________________________   PROCEDURES  Procedure(s) performed (including Critical Care):  Procedures   ____________________________________________   INITIAL IMPRESSION / ASSESSMENT AND PLAN / ED COURSE  Julietta Batterman was evaluated in Emergency Department on 06/04/2020 for the symptoms described in the history of present illness. She was evaluated in the context of the global COVID-19 pandemic, which necessitated consideration that the patient might be at risk for infection with the SARS-CoV-2 virus that causes COVID-19. Institutional protocols and algorithms that pertain to the evaluation of patients at risk for COVID-19 are in a state of rapid change based on information released by regulatory bodies including the CDC and federal and state organizations. These policies and algorithms were followed during the patient's care in the ED.    Differential includes pregnancy, UTI, will get CT scan to evaluate for appendicitis, diverticulitis, obstruction, abscess, perforation.  Patient declining pain medication.   Pregnancy test was negative.  Labs are reassuring.  UA without evidence of UTI.  This patient was evaluated during a time of global shortage of iodinated contrast media. Based on guidance from the [employer] of Radiology, best practices, and local institutional approaches an alternative path for evaluating and managing the patient may have been employed in order to provide optimal care during this shortage. The current situation has been discussed with the patient.     CT scan was negative.  They were able to visualize the appendix which was normal.  She did have some scattered diverticula which I did tell patient about.  Patient's pain is well controlled her vitals are stable at this time I feel she is safe for discharge home.  Again discussed with patient if  she was having any vaginal discharge or risk factors for STDs if she were wanting to do a vaginal exam.  Patient states that she has been with 1 partner for 7 years and denies any discharge and has declined at this time.  Therefore low suspicion for PID,   we discussed return precautions including worsening pain, fevers, vomiting.  We encouraged Tylenol and or ibuprofen to help with pain.  Patient expressed understanding felt comfortable  I discussed the provisional nature of ED diagnosis, the treatment so far, the ongoing plan of care, follow up appointments and return precautions with the patient and any family or support people present. They expressed understanding and agreed with the plan, discharged home.  ____________________________________________   FINAL CLINICAL IMPRESSION(S) / ED DIAGNOSES   Final diagnoses:  Lower abdominal pain      MEDICATIONS GIVEN DURING THIS VISIT:  Medications - No data to display   ED Discharge Orders    None       Note:  This document was prepared using Dragon voice recognition software and may include unintentional dictation errors.   Concha Se, MD 06/04/20 [phone]

## 2020-06-04 NOTE — Discharge Instructions (Addendum)
Take Tylenol 1 g every 8 hours and ibuprofen 400 every 6 hours with food to help with any abdominal discomfort.  Return to the ER if you develop worsening pain, fevers, vomiting or any other concern

## 2020-06-04 NOTE — ED Notes (Signed)
Verbal orders Loralyn Freshwater NP

## 2020-06-04 NOTE — ED Triage Notes (Signed)
Pt to ED POV for lower mid abd pain that started at 1430 today.  Pt reports was seen by [facility] because her pain has lightened since earlier so he thinks it can be appendix related

## 2020-06-05 ENCOUNTER — Ambulatory Visit: Payer: Self-pay

## 2020-06-05 ENCOUNTER — Telehealth: Payer: Self-pay

## 2020-06-05 NOTE — Telephone Encounter (Signed)
Please review.  It there a place you can work her in?  Thanks,   -Vernona Rieger

## 2020-06-05 NOTE — Telephone Encounter (Signed)
Copied from CRM [id number]. Topic: Appointment Scheduling - Scheduling Inquiry for Clinic >> Jun 05, 2020  1:55 PM Heidi [name] wrote: Patient was seen in the Urgent Care on 06/04/2020 for abdominal discomfort and was advise to follow up with patient PCP in 3 days. Patient requesting a 3:40pm slot on any day sooner then 06/22/2020. Patient is open to come in any time on 6/9 or 6/10. 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

## 2020-06-05 NOTE — Telephone Encounter (Signed)
The only time I could see her that late in the afternoon is 3:40 Monday June 6. She could also have the 1:20 acute visit slot on Tuesday the 31st if she likes.

## 2020-06-05 NOTE — Telephone Encounter (Signed)
Appointment changed to 06/15/2020.

## 2020-06-05 NOTE — Telephone Encounter (Signed)
   EO    2        Heidi [name] Female, 45 y.o., 06-Nov-1975  MRN:  [id number] Phone:  [phone] [name])       PCP:  [name], MD Coverage:  [facility]  Next Appt With Family Medicine 06/22/2020 at 3:40 PM         Message from Elliot Gault sent at 06/05/2020 2:06 PM EDT  Patient seeking clinical advice regarding abdominal soreness and head ache. Sent a message to the practice for a work in prior to 06/22/2020. Urgent care advised patient to follow up in 3 days    Call History   Type Contact Phone/Fax User  06/05/2020 01:54 PM EDT Phone (Incoming) Haile, Toppins (Self) [phone] Rexene Edison) Alvester Morin, Tiffany M   Pt. Seen in ED yesterday for abdominal pain. Has an appointment 06/22/20. Requests to be seen sooner due to continued pain. 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 Please advise pt.  Answer Assessment - Initial Assessment Questions 1. LOCATION: "Where does it hurt?"      Lower 2. RADIATION: "Does the pain shoot anywhere else?" (e.g., chest, back)     No 3. ONSET: "When did the pain begin?" (e.g., minutes, hours or days ago)      Yesterday 4. SUDDEN: "Gradual or sudden onset?"     Sudden 5. PATTERN "Does the pain come and go, or is it constant?"    - If constant: "Is it getting better, staying the same, or worsening?"      (Note: Constant means the pain never goes away completely; most serious pain is constant and it progresses)     - If intermittent: "How long does it last?" "Do you have pain now?"     (Note: Intermittent means the pain goes away completely between bouts)     Comes and goes 6. SEVERITY: "How bad is the pain?"  (e.g., Scale 1-10; mild, moderate, or severe)   - MILD (1-3): doesn't interfere with normal activities, abdomen soft and not tender to touch    - MODERATE (4-7): interferes with normal activities or awakens from sleep, abdomen tender to touch    - SEVERE (8-10):  excruciating pain, doubled over, unable to do any normal activities      Now - mild 7. RECURRENT SYMPTOM: "Have you ever had this type of stomach pain before?" If Yes, ask: "When was the last time?" and "What happened that time?"      No 8. CAUSE: "What do you think is causing the stomach pain?"     Unsure 9. RELIEVING/AGGRAVATING FACTORS: "What makes it better or worse?" (e.g., movement, antacids, bowel movement)     Sitting still - no pain 10. OTHER SYMPTOMS: "Do you have any other symptoms?" (e.g., back pain, diarrhea, fever, urination pain, vomiting)       No 11. PREGNANCY: "Is there any chance you are pregnant?" "When was your last menstrual period?"       No  Protocols used: ABDOMINAL PAIN - [facility]

## 2020-06-15 ENCOUNTER — Encounter: Payer: Self-pay | Admitting: Family Medicine

## 2020-06-15 ENCOUNTER — Ambulatory Visit: Payer: BC Managed Care – PPO | Admitting: Family Medicine

## 2020-06-15 ENCOUNTER — Other Ambulatory Visit: Payer: Self-pay

## 2020-06-15 VITALS — BP 122/84 | HR 73 | Wt 194.0 lb

## 2020-06-15 DIAGNOSIS — B977 Papillomavirus as the cause of diseases classified elsewhere: Secondary | ICD-10-CM | POA: Diagnosis not present

## 2020-06-15 DIAGNOSIS — Z800 Family history of malignant neoplasm of digestive organs: Secondary | ICD-10-CM | POA: Diagnosis not present

## 2020-06-15 DIAGNOSIS — R102 Pelvic and perineal pain: Secondary | ICD-10-CM | POA: Diagnosis not present

## 2020-06-15 DIAGNOSIS — R1030 Lower abdominal pain, unspecified: Secondary | ICD-10-CM | POA: Diagnosis not present

## 2020-06-15 NOTE — Progress Notes (Signed)
Established patient visit   Patient: Heidi [name]   DOB: 1975/05/15   45 y.o. Female  MRN: [id number] Visit Date: 06/15/2020  Today's healthcare provider: Lelon Huh, MD   Chief Complaint  Patient presents with  . Follow-up    Urgent Care follow up for abdominal pain.     Subjective    HPI HPI    Follow-up     Additional comments: Urgent Care follow up for abdominal pain.         Last edited by Kizzie Furnish, CMA on 06/15/2020  3:47 PM. (History)    Follow up ER visit  Patient was seen in ER for abdominal pain on 06/04/2020. She was treated for lower abdominal pain.  She reports sudden onset suprapubic on the date of the ER visit which gradually migrated to right adnexal area. No nausea or vomiting. Didn't notice and change in BM at the time but states over the last several months has had small but loose stools. She has family history of early colon cancer in her mother, last colonoscopy was 2 years ago. No blood in the stool or urine. No flank pain. She states it kind of felt like a labor pain. No menstrual changes. 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   Work up at [facility] included abdomen/pelvic CT w/o contrast remarkable only for scattered diverticuli, and normal CBC, met C, lipase, u/a and UCG. Pain improved while in ER and gradually improved since then, although is still not completely resolved.  -----------------------------------------------------------------------------------------   Allergies  Allergen Reactions  . Zyprexa [Olanzapine]     Aggravates restless legs     Medications: Outpatient Medications Prior to Visit  Medication Sig  . Cholecalciferol (VITAMIN D3) 1.25 MG (50000 UT) CAPS Take 1 capsule by mouth once a week.  . diclofenac (VOLTAREN) 75 MG EC tablet Take 1 tablet by mouth 2 (two) times daily.  Marland Kitchen gabapentin (NEURONTIN) 100 MG capsule Take 2 capsules (200 mg total) by mouth 2 (two) times daily. PLEASE NOTE CHANGE IN  QUANTITY TO 90 DAY SUPPLY  . lamoTRIgine (LAMICTAL) 100 MG tablet Take 100 mg by mouth 2 (two) times daily.  Marland Kitchen lithium carbonate (LITHOBID) 300 MG CR tablet Take 300 mg by mouth at bedtime.  . valACYclovir (VALTREX) 1000 MG tablet TAKE 1 TABLET BY MOUTH EVERY DAY   No facility-administered medications prior to visit.    Review of Systems  Constitutional: Negative.   Respiratory: Negative.   Cardiovascular: Negative.   Gastrointestinal: Positive for abdominal pain and constipation. Negative for anal bleeding, blood in stool, diarrhea, nausea, rectal pain and vomiting.  Genitourinary: Positive for pelvic pain (Pelvic Pressure. ).  Neurological: Negative for dizziness, light-headedness and headaches.      Objective    BP 122/84 (BP Location: Right Arm, Patient Position: Sitting, Cuff Size: Large)   Pulse 73   Wt 194 lb (88 kg)   LMP 05/17/2020   SpO2 100%   BMI 29.50 kg/m     Physical Exam  General appearance:  Well developed, well nourished female, cooperative and in no acute distress Head: Normocephalic, without obvious abnormality, atraumatic Respiratory: Respirations even and unlabored, normal respiratory rate Extremities: All extremities are intact.  Skin: Skin color, texture, turgor normal. No rashes seen  Psych: Appropriate mood and affect. Neurologic: Mental status: Alert, oriented to person, place, and time, thought content appropriate.    Assessment & Plan     1. Lower  abdominal pain Negative ER work up. Symptoms now mostly resolved. She is concerned due to family history of colon cancer.  - Ambulatory referral to Gastroenterology  2. Family history of colon cancer  - Ambulatory referral to Gastroenterology   3. Human papilloma virus  - Ambulatory referral to Obstetrics / Gynecology  4. Pelvic pain Symptoms much improved, but not resolved. Suggestive of gyn origin.   - Ambulatory referral to Obstetrics / Gynecology        The entirety of the  information documented in the History of Present Illness, Review of Systems and Physical Exam were personally obtained by me. Portions of this information were initially documented by the CMA and reviewed by me for thoroughness and accuracy.      Lelon Huh, MD  [facility] [phone] (phone) [phone] (fax)  [place]

## 2020-06-19 LAB — CBC
Hematocrit: 37.5 % (ref 34.0–46.6)
Hemoglobin: 12.7 g/dL (ref 11.1–15.9)
MCH: 33.9 pg — ABNORMAL HIGH (ref 26.6–33.0)
MCHC: 33.9 g/dL (ref 31.5–35.7)
MCV: 100 fL — ABNORMAL HIGH (ref 79–97)
Platelets: 341 10*3/uL (ref 150–450)
RBC: 3.75 x10E6/uL — ABNORMAL LOW (ref 3.77–5.28)
RDW: 11.2 % — ABNORMAL LOW (ref 11.7–15.4)
WBC: 7.6 10*3/uL (ref 3.4–10.8)

## 2020-06-19 LAB — VITAMIN B12: Vitamin B-12: 767 pg/mL (ref 232–1245)

## 2020-06-22 ENCOUNTER — Ambulatory Visit: Payer: BC Managed Care – PPO | Admitting: Family Medicine

## 2020-06-24 ENCOUNTER — Other Ambulatory Visit (HOSPITAL_COMMUNITY)
Admission: RE | Admit: 2020-06-24 | Discharge: 2020-06-24 | Disposition: A | Discharge status: Home / Self Care | Payer: BC Managed Care – PPO | Source: Ambulatory Visit | Attending: Obstetrics and Gynecology | Admitting: Obstetrics and Gynecology

## 2020-06-24 ENCOUNTER — Ambulatory Visit (INDEPENDENT_AMBULATORY_CARE_PROVIDER_SITE_OTHER): Payer: BC Managed Care – PPO | Admitting: Obstetrics and Gynecology

## 2020-06-24 ENCOUNTER — Encounter: Payer: Self-pay | Admitting: Obstetrics and Gynecology

## 2020-06-24 ENCOUNTER — Other Ambulatory Visit: Payer: Self-pay

## 2020-06-24 VITALS — BP 113/78 | HR 74 | Ht 68.0 in | Wt 187.0 lb

## 2020-06-24 DIAGNOSIS — Z124 Encounter for screening for malignant neoplasm of cervix: Secondary | ICD-10-CM | POA: Diagnosis not present

## 2020-06-24 DIAGNOSIS — R3915 Urgency of urination: Secondary | ICD-10-CM

## 2020-06-24 DIAGNOSIS — Z01419 Encounter for gynecological examination (general) (routine) without abnormal findings: Secondary | ICD-10-CM

## 2020-06-24 DIAGNOSIS — Z1239 Encounter for other screening for malignant neoplasm of breast: Secondary | ICD-10-CM

## 2020-06-24 DIAGNOSIS — R102 Pelvic and perineal pain: Secondary | ICD-10-CM

## 2020-06-24 NOTE — Progress Notes (Signed)
Gynecology Annual Exam  PCP: [name], MD  Chief Complaint: No chief complaint on file.   History of Present Illness: Patient is a 45 y.o. [name] presents for annual exam. The patient has no complaints today.   LMP: Patient's last menstrual period was 06/14/2020. No menstrual concerns.  She does report suprapubic pain, occasional radiating slightly more into the right lower quadrant but mostly midline.  She has noted occasional urgency no frequency or polyuria/polydipsia.  Has been significant enough that she has had to leave work early.  Has not had any other associated symptoms.  Unrelated to menstrual cycles.    The patient is sexually active. She currently uses tubal ligation for contraception. She denies dyspareunia.  The patient does perform self breast exams.  There is no notable family history of breast or ovarian cancer in her family.  The patient wears seatbelts: yes.   The patient has regular exercise: not asked.    The patient denies current symptoms of depression.    Review of Systems: Review of Systems  Constitutional:  Negative for chills and fever.  HENT:  Negative for congestion.   Respiratory:  Negative for cough and shortness of breath.   Cardiovascular:  Negative for chest pain and palpitations.  Gastrointestinal:  Positive for abdominal pain. Negative for constipation, diarrhea, heartburn, nausea and vomiting.  Genitourinary:  Positive for urgency. Negative for dysuria and frequency.  Skin:  Negative for itching and rash.  Neurological:  Negative for dizziness and headaches.  Endo/Heme/Allergies:  Negative for polydipsia.  Psychiatric/Behavioral:  Negative for depression.    Past Medical History:  Patient Active Problem List   Diagnosis Date Noted   B12 deficiency 04/08/2020    Lab Results  Component Value Date   VITAMINB12 183 (L) 04/03/2020       History of anemia 12/03/2014   History of asthma 12/03/2014   History of DVT (deep vein  thrombosis) 12/03/2014   H/O: depression 12/03/2014   Lupus anticoagulant disorder (HCC) 12/03/2014   History of migraine headaches 12/03/2014   H/O cardiovascular disorder 12/03/2014   Restless leg syndrome 12/03/2014   Disorder of connective tissue (HCC) 10/03/2013    Overview:  a.  Positive FANA, anti-DNA.   b.  Prior chest pain.   c.  Transient lupus anticoagulant.   d.  Raynaud's.      Past Surgical History:  Past Surgical History:  Procedure Laterality Date   ABLATION     COLONOSCOPY  2011   ENDOMETRIAL BIOPSY     NASAL SEPTUM SURGERY     NOSE SURGERY  1995   deviated nose septum   TONSILLECTOMY  1995   TUBAL LIGATION  2009    Gynecologic History:  No LMP recorded. Contraception: tubal ligation Last Pap: Results were:  04/03/2019 NIL and HR HPV+  Last mammogram: 04/10/2020 Results were: Elby Showers I  Obstetric History: [id number]  Family History:  Family History  Problem Relation Age of Onset   Cancer Mother 40       colon, rectal   Diabetes Father    Ovarian cancer Maternal Aunt 50       vs uterine?    Social History:  Social History   Socioeconomic History   Marital status: Divorced    Spouse name: Not on file   Number of children: Not on file   Years of education: Not on file   Highest education level: Not on file  Occupational History   Not on file  Tobacco  Use   Smoking status: Former    Packs/day: 0.50    Years: 29.00    Pack years: 14.50    Types: Cigarettes    Quit date: 01/09/2013    Years since quitting: 7.4   Smokeless tobacco: Never  Vaping Use   Vaping Use: Never used  Substance and Sexual Activity   Alcohol use: Yes    Comment: occasional   Drug use: No   Sexual activity: Yes    Birth control/protection: Surgical  Other Topics Concern   Not on file  Social History Narrative   Not on file   Social Determinants of Health   Financial Resource Strain: Not on file  Food Insecurity: Not on file  Transportation Needs: Not on file   Physical Activity: Not on file  Stress: Not on file  Social Connections: Not on file  Intimate Partner Violence: Not on file    Allergies:  Allergies  Allergen Reactions   Zyprexa [Olanzapine]     Aggravates restless legs    Medications: Prior to Admission medications   Medication Sig Start Date End Date Taking? Authorizing Provider  Cholecalciferol (VITAMIN D3) 1.25 MG (50000 UT) CAPS Take 1 capsule by mouth once a week. 02/22/20   [provider]  diclofenac (VOLTAREN) 75 MG EC tablet Take 1 tablet by mouth 2 (two) times daily.    [provider]  gabapentin (NEURONTIN) 100 MG capsule Take 2 capsules (200 mg total) by mouth 2 (two) times daily. PLEASE NOTE CHANGE IN QUANTITY TO 90 DAY SUPPLY 05/21/19   [name], MD  lamoTRIgine (LAMICTAL) 100 MG tablet Take 100 mg by mouth 2 (two) times daily.    [provider]  lithium carbonate (LITHOBID) 300 MG CR tablet Take 300 mg by mouth at bedtime. 03/26/20   [provider]  valACYclovir (VALTREX) 1000 MG tablet TAKE 1 TABLET BY MOUTH EVERY DAY 03/05/20   Vena Austria, MD    Physical Exam Vitals: Blood pressure 113/Heidi, pulse 74, height 5\' 8"  (1.727 m), weight 187 lb (84.8 kg), last menstrual period 06/14/2020.  General: NAD HEENT: normocephalic, anicteric Thyroid: no enlargement, no palpable nodules Pulmonary: No increased work of breathing, CTAB Cardiovascular: RRR, distal pulses 2+ Breast: Breast symmetrical, no tenderness, no palpable nodules or masses, no skin or nipple retraction present, no nipple discharge.  No axillary or supraclavicular lymphadenopathy. Abdomen: NABS, soft, non-tender, non-distended.  Umbilicus without lesions.  No hepatomegaly, splenomegaly or masses palpable. No evidence of hernia  Genitourinary:  External: Normal external female genitalia.  Normal urethral meatus, normal Bartholin's and Skene's glands.    Vagina: Normal vaginal mucosa, no evidence of prolapse.     Cervix: Grossly normal in appearance, no bleeding  Uterus: Non-enlarged, mobile, normal contour.  No CMT  Adnexa: ovaries non-enlarged, no adnexal masses  Rectal: deferred  Lymphatic: no evidence of inguinal lymphadenopathy Extremities: no edema, erythema, or tenderness Neurologic: Grossly intact Psychiatric: mood appropriate, affect full  Female chaperone present for pelvic and breast  portions of the physical exam    Assessment: 45 y.o. 47 routine annual exam  Plan: Problem List Items Addressed This Visit   None   1) Mammogram - recommend yearly screening mammogram.  Mammogram Is up to date - 04/10/2020 BI-RAD I  2) STI screening  was notoffered and therefore not obtained  3) ASCCP guidelines and rational discussed.  Patient opts for every 3 years screening interval  4) Contraception - the patient is currently using  tubal ligation.  She is not currently in need of contraception secondary to being sterile  5) Colonoscopy -- 03/13/2018  6) Routine healthcare maintenance including cholesterol, diabetes screening discussed managed by PCP  7) Urinary urgency and suprapubic pain- urology referral  no evidence of prolapse on exam - will obtain TUVS to rule out GYN etiology for pain  7) No follow-ups on file.   Vena Austria, MD, [facility], [facility] 06/24/2020, 10:14 AM

## 2020-06-27 ENCOUNTER — Encounter: Payer: Self-pay | Admitting: Family Medicine

## 2020-06-27 DIAGNOSIS — G2581 Restless legs syndrome: Secondary | ICD-10-CM

## 2020-06-29 LAB — CYTOLOGY - PAP
Adequacy: ABSENT
Comment: NEGATIVE
Diagnosis: UNDETERMINED — AB
High risk HPV: NEGATIVE

## 2020-06-30 NOTE — Telephone Encounter (Signed)
Patient is scheduled for 07/28/20 with [facility]

## 2020-07-02 ENCOUNTER — Encounter: Payer: Self-pay | Admitting: Urology

## 2020-07-02 ENCOUNTER — Other Ambulatory Visit: Payer: Self-pay

## 2020-07-02 ENCOUNTER — Ambulatory Visit: Payer: BC Managed Care – PPO | Admitting: Urology

## 2020-07-02 VITALS — BP 116/74 | HR 84 | Ht 68.0 in | Wt 190.0 lb

## 2020-07-02 DIAGNOSIS — R3915 Urgency of urination: Secondary | ICD-10-CM | POA: Diagnosis not present

## 2020-07-02 DIAGNOSIS — R102 Pelvic and perineal pain: Secondary | ICD-10-CM

## 2020-07-02 LAB — URINALYSIS, COMPLETE
Bilirubin, UA: NEGATIVE
Glucose, UA: NEGATIVE
Ketones, UA: NEGATIVE
Leukocytes,UA: NEGATIVE
Nitrite, UA: NEGATIVE
Protein,UA: NEGATIVE
RBC, UA: NEGATIVE
Specific Gravity, UA: <1.005 — ABNORMAL LOW (ref 1.005–1.030)
Urobilinogen, Ur: 0.2 mg/dL (ref 0.2–1.0)
pH, UA: 5 (ref 5.0–7.5)

## 2020-07-02 LAB — MICROSCOPIC EXAMINATION: RBC, Urine: NONE SEEN /hpf (ref 0–2)

## 2020-07-02 LAB — BLADDER SCAN AMB NON-IMAGING

## 2020-07-02 NOTE — Progress Notes (Signed)
07/02/20 4:10 PM   Heidi [name] 1976-01-06 [id number]  CC: Pelvic pain  HPI: She is a 45 year old female with bipolar disorder on lithium who was referred from GYN for pelvic pain.  She has baseline urinary frequency that is minimally bothersome, but no significant urgency or urge incontinence.  She does pretty well overnight.  She had acute onset of severe lower abdominal pain at the end of May 2022 that prompted an ER visit and CT at that time was completely benign, urinalysis was also completely benign.  Her pain resolved over the next few days.  She had been doing pretty well over the last few weeks, but had some mild recurrence of some lower abdominal discomfort over the last week.  She denies any change in her urination or gross hematuria during this time.  She was seen by GYN and a pelvic ultrasound was ordered but has not yet been completed, and she was also referred to urology.  She has had changes in her bowels with increased frequency and loose stools over this period of time as well.  She does report increased stress and anxiety over the last few months.  She denies any dysuria or incontinence.  Urinalysis today is completely benign.   PMH: Past Medical History:  Diagnosis Date   Anxiety    Asthma    Clotting disorder (HCC)    DVT   Depression    DVT (deep venous thrombosis) (HCC)    Family history of colon cancer in mother    Family history of ovarian cancer    4/21 cancer genetic tesitng letter sent   Herpes genitalis    Lupus (HCC)      Family History: Family History  Problem Relation Age of Onset   Diabetes Father    Cancer Mother 40       colon, rectal   Ovarian cancer Maternal Aunt 50       vs uterine?   Bladder Cancer Maternal Grandmother    Bladder Cancer Maternal Grandfather     Social History:  reports that she quit smoking about 7 years ago. Her smoking use included cigarettes. She has a 14.50 pack-year smoking history. She has never used  smokeless tobacco. She reports current alcohol use. She reports that she does not use drugs.  Physical Exam: BP 116/74 (BP Location: Left Arm, Patient Position: Sitting, Cuff Size: Large)   Pulse 84   Ht 5\' 8"  (1.727 m)   Wt 190 lb (86.2 kg)   LMP 06/14/2020   BMI 28.89 kg/m    Constitutional:  Alert and oriented, No acute distress. Cardiovascular: No clubbing, cyanosis, or edema. Respiratory: Normal respiratory effort, no increased work of breathing. GI: Abdomen is soft, nontender, nondistended, no abdominal masses   Laboratory Data: Reviewed, see HPI  Pertinent Imaging: I have personally viewed and interpreted the CT showing no urologic abnormalities, specifically no hydronephrosis, stones, and bladder normal-appearing.  Assessment & Plan:   45 year old female with mild urinary frequency at baseline and pelvic pain of unclear etiology.  Urinalysis has been benign x2, CT abdomen and pelvis without contrast was also benign.  Her symptoms have mostly resolved spontaneously.  Her symptoms correlated with change in her bowels as well previously.  This sounds like more of an IBS type picture to me, and I encouraged her to follow-up with her gastroenterologist.  We discussed the risks and benefits of cystoscopy, and I have a very high suspicion this would be normal.  She agrees with deferring  cystoscopy.  I do not have any good urologic cause for her pelvic pain, and I do not think this is urologic in nature.  We discussed options for overactive bladder symptoms including behavioral strategies and medications, and she would like to start with behavioral strategies.  Follow-up with urology as needed  I spent 45 total minutes on the day of the encounter including pre-visit review of the medical record, face-to-face time with the patient, and post visit ordering of labs/imaging/tests.  Legrand Rams, MD 07/02/2020  [facility] [address] [place], [place] [address] [phone]

## 2020-07-02 NOTE — Patient Instructions (Signed)
Pelvic Pain, Female Pelvic pain is pain in your lower belly (abdomen), below your belly button and between your hips. The pain may start suddenly (be acute), keep coming back (be recurring), or last a long time (become chronic). Pelvic pain that lasts longer than 6 months is called chronic pelvic pain. There are many causes of pelvic pain. Sometimes the cause of pelvic pain is not known. Follow these instructions at home:  Take over-the-counter and prescription medicines only as told by your doctor. Rest as told by your doctor. Do not have sex if it hurts. Keep a journal of your pelvic pain. Write down: When the pain started. Where the pain is located. What seems to make the pain better or worse, such as food or your period (menstrual cycle). Any symptoms you have along with the pain. Keep all follow-up visits as told by your doctor. This is important. Contact a doctor if: Medicine does not help your pain. Your pain comes back. You have new symptoms. You have unusual discharge or bleeding from your vagina. You have a fever or chills. You are having trouble pooping (constipation). You have blood in your pee (urine) or poop (stool). Your pee smells bad. You feel weak or light-headed. Get help right away if: You have sudden pain that is very bad. Your pain keeps getting worse. You have very bad pain and also have any of these symptoms: A fever. Feeling sick to your stomach (nausea). Throwing up (vomiting). Being very sweaty. You pass out (lose consciousness). Summary Pelvic pain is pain in your lower belly (abdomen), below your belly button and between your hips. There are many possible causes of pelvic pain. Keep a journal of your pelvic pain. This information is not intended to replace advice given to you by your health care provider. Make sure you discuss any questions you have with your health care provider. Document Revised: 06/14/2017 Document Reviewed: 06/14/2017 Elsevier  Patient Education  2022 Elsevier Inc.  

## 2020-07-04 MED ORDER — GABAPENTIN 300 MG PO CAPS: 300.0000 mg | ORAL_CAPSULE | Freq: Every day | ORAL | 3 refills | Status: DC

## 2020-07-17 ENCOUNTER — Encounter: Payer: Self-pay | Admitting: Family Medicine

## 2020-07-17 DIAGNOSIS — M791 Myalgia, unspecified site: Secondary | ICD-10-CM

## 2020-07-17 DIAGNOSIS — D6862 Lupus anticoagulant syndrome: Secondary | ICD-10-CM

## 2020-07-17 DIAGNOSIS — M2550 Pain in unspecified joint: Secondary | ICD-10-CM

## 2020-07-20 ENCOUNTER — Telehealth: Payer: Self-pay

## 2020-07-20 NOTE — Telephone Encounter (Signed)
Patient is calling to update [place] that she was seen form her ultrasound appointment today with [facility] imaging. Patient wants to know if she could get a call about her results or if she needs to wait for when she comes in for her colpo on 07/28/20. Please advise

## 2020-07-21 NOTE — Telephone Encounter (Signed)
Patient called yesterday to cancel gyn u/s follow up for 08/14/20 due to being able to be seen for ultrasound yesterday 07/20/20. Patient was hoping to be scheduled for over the phone visit or just have a call from AMS to go over results. No opening in his schedule . Patient aware. Advise patient she scheduled for 07/28/20 for in office appointment that we may have to wait until Tuesday. Please advise

## 2020-07-22 NOTE — Addendum Note (Signed)
Addended by: Mila Merry E on: 07/22/2020 01:30 PM   Modules accepted: Orders

## 2020-07-23 NOTE — Telephone Encounter (Signed)
AMS sent mychart message.

## 2020-07-24 NOTE — Telephone Encounter (Deleted)
Please review. I have a Rheumatology referral pending in this encounter. Which dx code should be used for referral?

## 2020-07-28 ENCOUNTER — Other Ambulatory Visit: Payer: Self-pay | Admitting: Obstetrics and Gynecology

## 2020-07-28 ENCOUNTER — Other Ambulatory Visit (HOSPITAL_COMMUNITY)
Admission: RE | Admit: 2020-07-28 | Discharge: 2020-07-28 | Disposition: A | Discharge status: Home / Self Care | Payer: BC Managed Care – PPO | Source: Ambulatory Visit | Attending: Obstetrics and Gynecology | Admitting: Obstetrics and Gynecology

## 2020-07-28 ENCOUNTER — Ambulatory Visit (INDEPENDENT_AMBULATORY_CARE_PROVIDER_SITE_OTHER): Payer: BC Managed Care – PPO | Admitting: Obstetrics and Gynecology

## 2020-07-28 ENCOUNTER — Other Ambulatory Visit: Payer: Self-pay

## 2020-07-28 ENCOUNTER — Encounter: Payer: Self-pay | Admitting: Obstetrics and Gynecology

## 2020-07-28 VITALS — BP 120/82 | Ht 68.0 in | Wt 195.0 lb

## 2020-07-28 DIAGNOSIS — R87610 Atypical squamous cells of undetermined significance on cytologic smear of cervix (ASC-US): Secondary | ICD-10-CM

## 2020-07-28 DIAGNOSIS — R102 Pelvic and perineal pain: Secondary | ICD-10-CM | POA: Diagnosis not present

## 2020-07-28 DIAGNOSIS — Z113 Encounter for screening for infections with a predominantly sexual mode of transmission: Secondary | ICD-10-CM

## 2020-07-28 NOTE — Progress Notes (Signed)
Gynecology Ultrasound Follow Up  Chief Complaint:  Chief Complaint  Patient presents with   Colposcopy    Procedure RM     History of Present Illness: Patient is a 45 y.o. female who presents today for ultrasound evaluation of pelvic pain.  Ultrasound demonstrates the following findgins Adnexa: small 1.5cm left hemorrhagic cyst vs endometrioma Uterus: Non-enlarged without evidence of uterine fibroids with endometrial stripe  0.59cm mildly heterogenous suspicious for potential adenomyosis Additional:no free fluid  Abdominal pain has subsided other than occasional sharp pain.  Has become more sharp vaginal almost more vaginal.   Review of Systems: Review of Systems  Constitutional: Negative.   Gastrointestinal: Negative.   Genitourinary: Negative.    Past Medical History:  Past Medical History:  Diagnosis Date   Anxiety    Asthma    Clotting disorder (HCC)    DVT   Depression    DVT (deep venous thrombosis) (HCC)    Family history of colon cancer in mother    Family history of ovarian cancer    4/21 cancer genetic tesitng letter sent   Herpes genitalis    Lupus [facility])     Past Surgical History:  Past Surgical History:  Procedure Laterality Date   ABLATION     COLONOSCOPY  2011   ENDOMETRIAL BIOPSY     NASAL SEPTUM SURGERY     NOSE SURGERY  1995   deviated nose septum   TONSILLECTOMY  1995   TUBAL LIGATION  2009    Gynecologic History:  Patient's last menstrual period was 07/14/2020. Contraception: tubal ligation Last Pap: 06/25/2019 Results were: .ASCUS with NEGATIVE high risk HPV  Family History:  Family History  Problem Relation Age of Onset   Cancer Mother 65       colon, rectal   Diabetes Father    Uterine cancer Maternal Aunt    Bladder Cancer Maternal Grandmother    Bladder Cancer Maternal Grandfather     Social History:  Social History   Socioeconomic History   Marital status: Divorced    Spouse name: Not on file   Number of children:  Not on file   Years of education: Not on file   Highest education level: Not on file  Occupational History   Not on file  Tobacco Use   Smoking status: Former    Packs/day: 0.50    Years: 29.00    Pack years: 14.50    Types: Cigarettes    Quit date: 01/09/2013    Years since quitting: 7.5   Smokeless tobacco: Never  Vaping Use   Vaping Use: Never used  Substance and Sexual Activity   Alcohol use: Yes    Comment: occasional   Drug use: No   Sexual activity: Yes    Birth control/protection: Surgical  Other Topics Concern   Not on file  Social History Narrative   Not on file   Social Determinants of Health   Financial Resource Strain: Not on file  Food Insecurity: Not on file  Transportation Needs: Not on file  Physical Activity: Not on file  Stress: Not on file  Social Connections: Not on file  Intimate Partner Violence: Not on file    Allergies:  Allergies  Allergen Reactions   Zyprexa [Olanzapine]     Aggravates restless legs    Medications: Prior to Admission medications   Medication Sig Start Date End Date Taking? Authorizing Provider  Cholecalciferol (VITAMIN D3) 1.25 MG (50000 UT) CAPS Take 1 capsule by mouth  once a week. 02/22/20  Yes [provider]  Cyanocobalamin (B-12) 1000 MCG SUBL  04/28/20  Yes [provider]  diclofenac (VOLTAREN) 75 MG EC tablet Take 1 tablet by mouth 2 (two) times daily.   Yes [provider]  gabapentin (NEURONTIN) 300 MG capsule Take 1-2 capsules (300-600 mg total) by mouth at bedtime. 07/04/20  Yes [name], MD  lamoTRIgine (LAMICTAL) 100 MG tablet Take 100 mg by mouth 2 (two) times daily.   Yes [provider]  valACYclovir (VALTREX) 1000 MG tablet TAKE 1 TABLET BY MOUTH EVERY DAY 03/05/20  Yes Vena Austria, MD    Physical Exam Vitals: Blood pressure 120/82, height 5\' 8"  (1.727 m), weight 195 lb (88.5 kg), last menstrual period 07/14/2020.  General: NAD HEENT: normocephalic,  anicteric Pulmonary: No increased work of breathing Extremities: no edema, erythema, or tenderness Neurologic: Grossly intact, normal gait Psychiatric: mood appropriate, affect full    GYNECOLOGY CLINIC COLPOSCOPY PROCEDURE NOTE  45 y.o. 49 here for colposcopy for 06/24/2020 pap smear on ASCUS HPV negative. Discussed underlying role for HPV infection in the development of cervical dysplasia, its natural history and progression/regression, need for surveillance.  Preceeding pap 04/03/2019 NILM HPV positive 16/18 negative     Is the patient  pregnant: No LMP: Patient's last menstrual period was 07/14/2020. Smoking status:  reports that she quit smoking about 7 years ago. Her smoking use included cigarettes. She has a 14.50 pack-year smoking history. She has never used smokeless tobacco. Contraception: tubal ligation Number current sexual partners:  1 High risk partner:No History of STD:  No Future fertility desired:  No  Patient given informed consent, signed copy in the chart, time out was performed.  The patient was position in dorsal lithotomy position. Speculum was placed the cervix was visualized.   After application of acetic acid colposcopic inspection of the cervix was undertaken.   Colposcopy adequate, full visualization of transformation zone: Yes no visible lesions; corresponding biopsies obtained.   ECC specimen obtained:  Yes  All specimens were labeled and sent to pathology.   Patient was given post procedure instructions.  Will follow up pathology and manage accordingly.  Routine preventative health maintenance measures emphasized.    Assessment: 45 y.o. 49 follow up ASCUS HPV negative pap and ultrasound for pelvic pain   Plan: Problem List Items Addressed This Visit   None   1) ASCUS HPV negative pap - given follow up of NILM HPV positive pap screening algorithm dictates colposcopy.  No visible lesion were noted.   Suspect CIN I or less anticipate  follow up pap in 12 months  2) Pelvic pain - TVUS normal. I suspect that the 1.5 LOV cyst visualized represent a hemorrhagic cyst as opposed to endometriosis for the fact that the patient is status post prior BTL which is thought to decrease the risk of endometriosis formation given that the leading theory on pathogenesis is retrograde menstruation.  In addition the fact that the lesion was not visualized on CT abdomen and pelvis 06/04/2020 also favor hemorrhagic cyst.  We discussed hormonal means of menstrual ovarian suppression given also suspicion of adenomyosis raised.  At present patient opts for conservative management   06/06/2020, MD, Vena Austria [facility], [facility] 07/28/2020, 8:22 AM

## 2020-07-29 ENCOUNTER — Ambulatory Visit: Payer: BC Managed Care – PPO | Admitting: Gastroenterology

## 2020-07-29 ENCOUNTER — Encounter: Payer: Self-pay | Admitting: Gastroenterology

## 2020-07-29 ENCOUNTER — Other Ambulatory Visit: Payer: BC Managed Care – PPO

## 2020-07-29 VITALS — BP 134/81 | HR 66 | Temp 98.2°F | Ht 68.0 in | Wt 194.0 lb

## 2020-07-29 DIAGNOSIS — R140 Abdominal distension (gaseous): Secondary | ICD-10-CM

## 2020-07-29 DIAGNOSIS — D509 Iron deficiency anemia, unspecified: Secondary | ICD-10-CM | POA: Diagnosis not present

## 2020-07-29 DIAGNOSIS — Z113 Encounter for screening for infections with a predominantly sexual mode of transmission: Secondary | ICD-10-CM

## 2020-07-29 DIAGNOSIS — K529 Noninfective gastroenteritis and colitis, unspecified: Secondary | ICD-10-CM | POA: Diagnosis not present

## 2020-07-29 LAB — SURGICAL PATHOLOGY

## 2020-07-29 NOTE — Telephone Encounter (Signed)
Patient is scheduled for 11 am labs today

## 2020-07-29 NOTE — Progress Notes (Signed)
Heidi Repress, MD [address]  [address]  [place], [place] [address]  Main: [phone]  Fax: [phone]    Gastroenterology Consultation  Referring Provider:     [name], MD Primary Care Physician:  Heidi Limes, MD Primary Gastroenterologist:  Dr. Arlyss [name] Reason for Consultation: Lower abdominal pain, loose stools        HPI:   Heidi [name] is a 45 y.o. female referred by Dr. [name], MD  for consultation & management of several months history of lower abdominal pain associated with abdominal bloating, increased bowel frequency.  Patient reports that her bowel movements have always been mushy to runny, on Bristol stool scale from 5-7.  She denies any rectal bleeding.  She does have left lower quadrant discomfort.  She had CT abdomen and pelvis without contrast which did not reveal any acute intra-abdominal pathology.  She did have sigmoid diverticulosis.  Patient does have history of iron deficiency as well as macrocytosis from severe B12 deficiency.  She took B12 supplementation for 2 months that has resulted in normal B12 levels.  No evidence of anemia.  Patient denies any weight loss.  She does have chronic fatigue.  Patient reports mother with colon cancer in her 82s   NSAIDs: None  Antiplts/Anticoagulants/Anti thrombotics: None  GI Procedures:  Patient had a colonoscopy by Dr. Mechele Collin approximately 2 years ago, report not available but reportedly normal  Colonoscopy 10/14/2013, normal Diagnosis:  RANDOM COLON COLD BIOPSY:  - COLONIC MUCOSA WITH FOCAL LYMPHOID AGGREGATES.  - NEGATIVE FOR MICROSCOPIC COLITIS.   Past Medical History:  Diagnosis Date   Anxiety    Asthma    Clotting disorder (HCC)    DVT   Depression    DVT (deep venous thrombosis) (HCC)    Family history of colon cancer in mother    Family history of ovarian cancer    4/21 cancer genetic tesitng letter sent   Herpes genitalis    Lupus (HCC)     Past  Surgical History:  Procedure Laterality Date   ABLATION     COLONOSCOPY  2011   ENDOMETRIAL BIOPSY     NASAL SEPTUM SURGERY     NOSE SURGERY  1995   deviated nose septum   TONSILLECTOMY  1995   TUBAL LIGATION  2009    Current Outpatient Medications:    Cholecalciferol (VITAMIN D3) 1.25 MG (50000 UT) CAPS, Take 1 capsule by mouth once a week., Disp: , Rfl:    Cyanocobalamin (B-12) 1000 MCG SUBL, , Disp: , Rfl:    diclofenac (VOLTAREN) 75 MG EC tablet, Take 1 tablet by mouth 2 (two) times daily., Disp: , Rfl:    gabapentin (NEURONTIN) 300 MG capsule, Take 1-2 capsules (300-600 mg total) by mouth at bedtime., Disp: 60 capsule, Rfl: 3   lamoTRIgine (LAMICTAL) 100 MG tablet, Take 100 mg by mouth 2 (two) times daily., Disp: , Rfl:    valACYclovir (VALTREX) 1000 MG tablet, TAKE 1 TABLET BY MOUTH EVERY DAY, Disp: 90 tablet, Rfl: 1    Family History  Problem Relation Age of Onset   Cancer Mother 62       colon, rectal   Diabetes Father    Uterine cancer Maternal Aunt    Bladder Cancer Maternal Grandmother    Bladder Cancer Maternal Grandfather      Social History   Tobacco Use   Smoking status: Former    Packs/day: 0.50    Years: 29.00  Pack years: 14.50    Types: Cigarettes    Quit date: 01/09/2013    Years since quitting: 7.5   Smokeless tobacco: Never  Vaping Use   Vaping Use: Never used  Substance Use Topics   Alcohol use: Yes    Comment: occasional   Drug use: No    Allergies as of 07/29/2020 - Review Complete 07/29/2020  Allergen Reaction Noted   Zyprexa [olanzapine]  04/03/2020    Review of Systems:    All systems reviewed and negative except where noted in HPI.   Physical Exam:  BP 134/81 (BP Location: Left Arm, Patient Position: Sitting, Cuff Size: Normal)   Pulse 66   Temp 98.2 F (36.8 C) (Oral)   Ht 5\' 8"  (1.727 m)   Wt 194 lb (88 kg)   LMP 07/14/2020   BMI 29.50 kg/m  Patient's last menstrual period was 07/14/2020.  General:   Alert,   Well-developed, well-nourished, pleasant and cooperative in NAD Head:  Normocephalic and atraumatic. Eyes:  Sclera clear, no icterus.   Conjunctiva pink. Ears:  Normal auditory acuity. Nose:  No deformity, discharge, or lesions. Mouth:  No deformity or lesions,oropharynx pink & moist. Neck:  Supple; no masses or thyromegaly. Lungs:  Respirations even and unlabored.  Clear throughout to auscultation.   No wheezes, crackles, or rhonchi. No acute distress. Heart:  Regular rate and rhythm; no murmurs, clicks, rubs, or gallops. Abdomen:  Normal bowel sounds. Soft, non-tender and non-distended without masses, hepatosplenomegaly or hernias noted.  No guarding or rebound tenderness.   Rectal: Not performed Msk:  Symmetrical without gross deformities. Good, equal movement & strength bilaterally. Pulses:  Normal pulses noted. Extremities:  No clubbing or edema.  No cyanosis. Neurologic:  Alert and oriented x3;  grossly normal neurologically. Skin:  Intact without significant lesions or rashes. No jaundice. Psych:  Alert and cooperative. Normal mood and affect.  Imaging Studies: Reviewed  Assessment and Plan:   Annelle [name] is a 45 y.o. female with history of DVT is seen in consultation for chronic symptoms of increased bowel frequency, loose stools, abdominal bloating, history of iron and B12 deficiency without anemia  Check celiac panel, H. pylori breath test, pancreatic fecal elastase levels, fecal calprotectin levels, GI profile PCR as well as repeat iron panel We will try to obtain copy of the most recent colonoscopy report  Follow up in 3 months   47, MD

## 2020-07-30 ENCOUNTER — Other Ambulatory Visit: Payer: Self-pay | Admitting: Gastroenterology

## 2020-07-30 LAB — RPR: RPR Ser Ql: NONREACTIVE

## 2020-07-30 LAB — HIV ANTIBODY (ROUTINE TESTING W REFLEX): HIV Screen 4th Generation wRfx: NONREACTIVE

## 2020-07-30 LAB — HEPATITIS B SURFACE ANTIGEN: Hepatitis B Surface Ag: NEGATIVE

## 2020-07-31 ENCOUNTER — Telehealth: Payer: Self-pay

## 2020-07-31 MED ORDER — FUSION PLUS PO CAPS: 1.0000 | ORAL_CAPSULE | Freq: Every day | ORAL | 0 refills | Status: DC

## 2020-07-31 NOTE — Telephone Encounter (Signed)
Patient called because she states she is not understanding the results. She wants to know how she is iron def anemia because her iron level is normal and ferritin is normal. She states the rest of the test are cancel

## 2020-07-31 NOTE — Telephone Encounter (Signed)
Sent medication to the pharmacy. Called and left a message for call back. Sent mychart message with results.

## 2020-07-31 NOTE — Telephone Encounter (Signed)
Called patient to answer her questions.  Reassured her that she does have only mild iron deficiency without anemia.  She does not need repeat iron panel at this time.  Asked her to take fusion plus despite a month.  And she can stop the B12 supplement that she is currently on  Patient expressed understanding of the results  Heidi [name]

## 2020-07-31 NOTE — Telephone Encounter (Signed)
-----   Message from Toney Reil, MD sent at 07/30/2020  4:30 PM EDT ----- Patient has severe iron deficiency.  Recommend fusion plus, 1 pill a day for 1 month  RV

## 2020-08-02 LAB — GC/CHLAMYDIA PROBE AMP
Chlamydia trachomatis, NAA: NEGATIVE
Neisseria Gonorrhoeae by PCR: NEGATIVE

## 2020-08-03 ENCOUNTER — Encounter: Payer: Self-pay | Admitting: Gastroenterology

## 2020-08-04 ENCOUNTER — Encounter: Payer: Self-pay | Admitting: Gastroenterology

## 2020-08-05 LAB — IRON,TIBC AND FERRITIN PANEL
Ferritin: 21 ng/mL (ref 15–150)
Iron: 94 ug/dL (ref 27–159)

## 2020-08-05 LAB — CELIAC DISEASE PANEL
Endomysial IgA: NEGATIVE
IgA/Immunoglobulin A, Serum: 127 mg/dL (ref 87–352)
Transglutaminase IgA: <2 U/mL (ref 0–3)

## 2020-08-05 LAB — H. PYLORI BREATH TEST: H pylori Breath Test: NEGATIVE

## 2020-08-06 ENCOUNTER — Encounter: Payer: Self-pay | Admitting: Gastroenterology

## 2020-08-06 LAB — GI PROFILE, STOOL, PCR

## 2020-08-06 LAB — CALPROTECTIN, FECAL: Calprotectin, Fecal: <16 ug/g (ref 0–120)

## 2020-08-06 LAB — PANCREATIC ELASTASE, FECAL: Pancreatic Elastase, Fecal: 472 ug Elast./g (ref >200)

## 2020-08-11 NOTE — Telephone Encounter (Signed)
[facility] to make sure they have the request and they state they do.

## 2020-08-12 ENCOUNTER — Other Ambulatory Visit: Payer: Self-pay

## 2020-08-13 ENCOUNTER — Encounter: Payer: Self-pay | Admitting: Gastroenterology

## 2020-08-14 ENCOUNTER — Ambulatory Visit: Payer: BC Managed Care – PPO | Admitting: Obstetrics and Gynecology

## 2020-09-07 ENCOUNTER — Other Ambulatory Visit: Payer: Self-pay

## 2020-09-07 DIAGNOSIS — D509 Iron deficiency anemia, unspecified: Secondary | ICD-10-CM

## 2020-09-07 NOTE — Progress Notes (Signed)
Order repeat iron panel

## 2020-09-08 LAB — IRON,TIBC AND FERRITIN PANEL
Ferritin: 39 ng/mL (ref 15–150)
Iron Saturation: 29 % (ref 15–55)
Iron: 88 ug/dL (ref 27–159)
Total Iron Binding Capacity: 307 ug/dL (ref 250–450)
UIBC: 219 ug/dL (ref 131–425)

## 2020-09-09 ENCOUNTER — Other Ambulatory Visit: Payer: Self-pay | Admitting: Obstetrics and Gynecology

## 2020-09-09 ENCOUNTER — Encounter: Payer: Self-pay | Admitting: Gastroenterology

## 2020-09-10 MED ORDER — FUSION PLUS PO CAPS: 1.0000 | ORAL_CAPSULE | Freq: Every day | ORAL | 0 refills | Status: DC

## 2020-09-16 ENCOUNTER — Ambulatory Visit (INDEPENDENT_AMBULATORY_CARE_PROVIDER_SITE_OTHER): Payer: BC Managed Care – PPO | Admitting: Obstetrics and Gynecology

## 2020-09-16 ENCOUNTER — Other Ambulatory Visit: Payer: Self-pay

## 2020-09-16 ENCOUNTER — Encounter: Payer: Self-pay | Admitting: Obstetrics and Gynecology

## 2020-09-16 VITALS — BP 150/96 | Ht 68.0 in | Wt 191.0 lb

## 2020-09-16 DIAGNOSIS — F4320 Adjustment disorder, unspecified: Secondary | ICD-10-CM

## 2020-09-16 DIAGNOSIS — Z8659 Personal history of other mental and behavioral disorders: Secondary | ICD-10-CM

## 2020-09-16 DIAGNOSIS — F4321 Adjustment disorder with depressed mood: Secondary | ICD-10-CM
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
## 2020-09-22 ENCOUNTER — Other Ambulatory Visit: Payer: Self-pay

## 2020-09-22 ENCOUNTER — Ambulatory Visit: Payer: BC Managed Care – PPO | Admitting: Psychology

## 2020-09-22 DIAGNOSIS — F4310 Post-traumatic stress disorder, unspecified: Secondary | ICD-10-CM

## 2020-09-23 NOTE — Telephone Encounter (Signed)
Yes that is fine until she established care with Dr. Maryruth Bun

## 2020-09-28 ENCOUNTER — Ambulatory Visit: Payer: BC Managed Care – PPO | Admitting: Psychology

## 2020-09-28 ENCOUNTER — Other Ambulatory Visit: Payer: Self-pay

## 2020-09-28 DIAGNOSIS — F4310 Post-traumatic stress disorder, unspecified: Secondary | ICD-10-CM | POA: Diagnosis not present

## 2020-10-05 ENCOUNTER — Encounter: Payer: Self-pay | Admitting: Family Medicine

## 2020-10-06 ENCOUNTER — Ambulatory Visit (INDEPENDENT_AMBULATORY_CARE_PROVIDER_SITE_OTHER): Payer: BC Managed Care – PPO | Admitting: Psychology

## 2020-10-06 ENCOUNTER — Other Ambulatory Visit: Payer: Self-pay

## 2020-10-06 DIAGNOSIS — F4310 Post-traumatic stress disorder, unspecified: Secondary | ICD-10-CM | POA: Diagnosis not present

## 2020-10-07 ENCOUNTER — Other Ambulatory Visit: Payer: Self-pay

## 2020-10-07 DIAGNOSIS — M359 Systemic involvement of connective tissue, unspecified: Secondary | ICD-10-CM

## 2020-10-07 NOTE — Telephone Encounter (Signed)
Patient sent mychart message requesting a refill. Please advise. This medication is listed as historical on patient's medication list.

## 2020-10-08 MED ORDER — DICLOFENAC SODIUM 75 MG PO TBEC: 75.0000 mg | DELAYED_RELEASE_TABLET | Freq: Two times a day (BID) | ORAL | 3 refills | Status: DC | PRN

## 2020-10-14 ENCOUNTER — Other Ambulatory Visit: Payer: Self-pay

## 2020-10-14 ENCOUNTER — Ambulatory Visit: Payer: BC Managed Care – PPO | Admitting: Psychology

## 2020-10-14 DIAGNOSIS — F4310 Post-traumatic stress disorder, unspecified: Secondary | ICD-10-CM | POA: Diagnosis not present

## 2020-10-19 ENCOUNTER — Encounter: Payer: Self-pay | Admitting: Gastroenterology

## 2020-10-19 DIAGNOSIS — D509 Iron deficiency anemia, unspecified: Secondary | ICD-10-CM

## 2020-10-20 ENCOUNTER — Ambulatory Visit: Payer: BC Managed Care – PPO | Admitting: Psychology

## 2020-10-20 ENCOUNTER — Other Ambulatory Visit: Payer: Self-pay

## 2020-10-20 ENCOUNTER — Ambulatory Visit: Payer: Self-pay | Admitting: Psychology

## 2020-10-20 DIAGNOSIS — F4310 Post-traumatic stress disorder, unspecified: Secondary | ICD-10-CM | POA: Diagnosis not present

## 2020-11-04 ENCOUNTER — Ambulatory Visit: Payer: BC Managed Care – PPO | Admitting: Gastroenterology

## 2020-11-05 ENCOUNTER — Other Ambulatory Visit: Payer: Self-pay

## 2020-11-05 ENCOUNTER — Ambulatory Visit: Payer: BC Managed Care – PPO | Admitting: Psychology

## 2020-11-05 DIAGNOSIS — F4310 Post-traumatic stress disorder, unspecified: Secondary | ICD-10-CM | POA: Diagnosis not present

## 2020-11-09 ENCOUNTER — Ambulatory Visit: Payer: BC Managed Care – PPO | Admitting: Psychology

## 2020-11-09 ENCOUNTER — Other Ambulatory Visit: Payer: Self-pay

## 2020-11-09 DIAGNOSIS — F4310 Post-traumatic stress disorder, unspecified: Secondary | ICD-10-CM

## 2020-11-10 LAB — IRON,TIBC AND FERRITIN PANEL
Ferritin: 41 ng/mL (ref 15–150)
Iron Saturation: 26 % (ref 15–55)
Iron: 87 ug/dL (ref 27–159)
Total Iron Binding Capacity: 337 ug/dL (ref 250–450)
UIBC: 250 ug/dL (ref 131–425)

## 2020-11-19 ENCOUNTER — Other Ambulatory Visit: Payer: Self-pay

## 2020-11-19 ENCOUNTER — Ambulatory Visit (INDEPENDENT_AMBULATORY_CARE_PROVIDER_SITE_OTHER): Payer: BC Managed Care – PPO | Admitting: Psychology

## 2020-11-19 DIAGNOSIS — F4310 Post-traumatic stress disorder, unspecified: Secondary | ICD-10-CM

## 2020-11-23 ENCOUNTER — Encounter: Payer: Self-pay | Admitting: Family Medicine

## 2020-11-26 ENCOUNTER — Ambulatory Visit (INDEPENDENT_AMBULATORY_CARE_PROVIDER_SITE_OTHER): Payer: BC Managed Care – PPO | Admitting: Psychology

## 2020-11-26 ENCOUNTER — Other Ambulatory Visit: Payer: Self-pay

## 2020-11-26 DIAGNOSIS — F4310 Post-traumatic stress disorder, unspecified: Secondary | ICD-10-CM | POA: Diagnosis not present

## 2020-11-30 ENCOUNTER — Other Ambulatory Visit: Payer: Self-pay | Admitting: Family Medicine

## 2020-11-30 DIAGNOSIS — G2581 Restless legs syndrome: Secondary | ICD-10-CM

## 2020-12-01 ENCOUNTER — Ambulatory Visit: Payer: BC Managed Care – PPO | Admitting: Psychology

## 2020-12-01 ENCOUNTER — Other Ambulatory Visit: Payer: Self-pay

## 2020-12-01 DIAGNOSIS — F4310 Post-traumatic stress disorder, unspecified: Secondary | ICD-10-CM | POA: Diagnosis not present

## 2020-12-08 ENCOUNTER — Ambulatory Visit: Payer: BC Managed Care – PPO | Admitting: Psychology

## 2020-12-08 ENCOUNTER — Other Ambulatory Visit: Payer: Self-pay

## 2020-12-08 DIAGNOSIS — F4310 Post-traumatic stress disorder, unspecified: Secondary | ICD-10-CM

## 2020-12-15 ENCOUNTER — Other Ambulatory Visit: Payer: Self-pay

## 2020-12-15 ENCOUNTER — Ambulatory Visit (INDEPENDENT_AMBULATORY_CARE_PROVIDER_SITE_OTHER): Payer: BC Managed Care – PPO | Admitting: Psychology

## 2020-12-15 DIAGNOSIS — F4310 Post-traumatic stress disorder, unspecified: Secondary | ICD-10-CM

## 2020-12-22 ENCOUNTER — Ambulatory Visit (INDEPENDENT_AMBULATORY_CARE_PROVIDER_SITE_OTHER): Payer: BC Managed Care – PPO | Admitting: Psychology

## 2020-12-22 DIAGNOSIS — F4310 Post-traumatic stress disorder, unspecified: Secondary | ICD-10-CM | POA: Diagnosis not present

## 2020-12-22 NOTE — Progress Notes (Signed)
[facility] Counselor/Therapist Progress Note  Patient ID: Heidi [name], MRN: [id number],    Date: 12/22/2020  Time Spent: 60 minutes  Treatment Type: Individual Therapy  Reported Symptoms: sadness, irritability,   Mental Status Exam: Appearance:  Casual     Behavior: Appropriate  Motor: Normal  Speech/Language:  Normal Rate  Affect: Depressed  Mood: depressed  Thought process: normal  Thought content:   WNL  Sensory/Perceptual disturbances:   WNL  Orientation: oriented to person, place, time/date, and situation  Attention: Good  Concentration: Good  Memory: WNL  Fund of knowledge:  Good  Insight:   Good  Judgment:  Good  Impulse Control: Good   Risk Assessment: Danger to Self:  No Self-injurious Behavior: No Danger to Others: No Duty to Warn:no Physical Aggression / Violence:No  Access to Firearms a concern: No  Gang Involvement:No   Subjective: The patient attended a face-to-face individual therapy session in the office today.  The patient reports that she is struggling with the loss of her son.  The patient reports that she had an issue at work where she became frustrated and felt that she was somewhat unprofessional.  It seems that her principal seemed to handle things well.  The patient reports that she seems to be having more difficulty now than she did even in the beginning.  I explained to her that it was likely because she is moving to a different stage of grief.  We will continue to process her feelings and help her cope with the loss of her son.  Interventions: Grief Therapy  Diagnosis:PTSD (post-traumatic stress disorder)  Plan: Please see plan in Therapy charts with a target date of 09/23/2021  Mora Bellman, LCSW

## 2020-12-28 ENCOUNTER — Encounter: Payer: Self-pay | Admitting: Family Medicine

## 2020-12-30 ENCOUNTER — Other Ambulatory Visit: Payer: Self-pay

## 2020-12-30 ENCOUNTER — Ambulatory Visit (INDEPENDENT_AMBULATORY_CARE_PROVIDER_SITE_OTHER): Payer: BC Managed Care – PPO | Admitting: Psychology

## 2020-12-30 DIAGNOSIS — Z634 Disappearance and death of family member: Secondary | ICD-10-CM | POA: Diagnosis not present

## 2020-12-30 DIAGNOSIS — F4321 Adjustment disorder with depressed mood: Secondary | ICD-10-CM

## 2020-12-30 DIAGNOSIS — F4310 Post-traumatic stress disorder, unspecified: Secondary | ICD-10-CM

## 2020-12-30 NOTE — Progress Notes (Signed)
[facility] Counselor/Therapist Progress Note  Patient ID: Heidi [name], MRN: [id number],    Date: 12/30/2020  Time Spent: 60 minutes  Treatment Type: Individual Therapy  Reported Symptoms: sadness, irritability,   Mental Status Exam: Appearance:  Casual     Behavior: Appropriate  Motor: Normal  Speech/Language:  Normal Rate  Affect: Depressed  Mood: depressed  Thought process: normal  Thought content:   WNL  Sensory/Perceptual disturbances:   WNL  Orientation: oriented to person, place, time/date, and situation  Attention: Good  Concentration: Good  Memory: WNL  Fund of knowledge:  Good  Insight:   Good  Judgment:  Good  Impulse Control: Good   Risk Assessment: Danger to Self:  No Self-injurious Behavior: No Danger to Others: No Duty to Warn:no Physical Aggression / Violence:No  Access to Firearms a concern: No  Gang Involvement:No   Subjective: The patient attended a face-to-face individual therapy session in the office today.  The patient reports that she is struggling with the loss of her son here at Christmas time.  She talked about her logical brain knowing that she did everything she could for him, yet she is giving herself a hard time about what she could not do to fix the situation and prevent his suicide.  I explained to her that we probably need to do EMDR to help her cope better and let her brain heal better from the trauma.  We talked about how trauma works in the brain and I encouraged her to get through the holidays and we discussed starting EMDR in the near future to help her cope better.  The patient verbalized her feelings about her loss and feelings that she neglected her oldest son because she spend much of her time with her youngest.  We talked about this using some reframing tools.  Interventions: Grief Therapy, Insight Oriented and CBT  Diagnosis:PTSD (post-traumatic stress disorder)  Grief at loss of child  Plan: Please see  plan in Therapy charts with a target date of 09/23/2021.  The patient approved this plan and is currently maintaining progress.  Dimple Bastyr G Keyondra Lagrand, LCSW

## 2021-01-05 ENCOUNTER — Other Ambulatory Visit: Payer: Self-pay

## 2021-01-05 ENCOUNTER — Ambulatory Visit (INDEPENDENT_AMBULATORY_CARE_PROVIDER_SITE_OTHER): Payer: BC Managed Care – PPO | Admitting: Psychology

## 2021-01-05 DIAGNOSIS — F4321 Adjustment disorder with depressed mood: Secondary | ICD-10-CM

## 2021-01-05 DIAGNOSIS — F4310 Post-traumatic stress disorder, unspecified: Secondary | ICD-10-CM

## 2021-01-05 DIAGNOSIS — Z634 Disappearance and death of family member: Secondary | ICD-10-CM | POA: Diagnosis not present

## 2021-01-05 NOTE — Progress Notes (Signed)
[facility] Counselor/Therapist Progress Note  Patient ID: Heidi [name], MRN: [id number],    Date: 01/05/2021  Time Spent: 60 minutes  Treatment Type: Individual Therapy  Reported Symptoms: sadness, irritability,   Mental Status Exam: Appearance:  Casual     Behavior: Appropriate  Motor: Normal  Speech/Language:  Normal Rate  Affect: Depressed  Mood: depressed  Thought process: normal  Thought content:   WNL  Sensory/Perceptual disturbances:   WNL  Orientation: oriented to person, place, time/date, and situation  Attention: Good  Concentration: Good  Memory: WNL  Fund of knowledge:  Good  Insight:   Good  Judgment:  Good  Impulse Control: Good   Risk Assessment: Danger to Self:  No Self-injurious Behavior: No Danger to Others: No Duty to Warn:no Physical Aggression / Violence:No  Access to Firearms a concern: No  Gang Involvement:No   Subjective: The patient attended a face-to-face individual therapy session in the office today.  The patient reports that the holidays were very difficult.  The patient presents with a sad affect and mood is depressed.  The patient says that she has been feeling guilty because she could not save her son.  We talked about really getting things started with the EMDR so that she would not feel so guilty.  She also talked about her oldest son and she had a conversation with her son about his depression.  She is encouraging him to get some help.  We discussed talking more about EMDR during the next session.  Interventions: Grief Therapy, Insight Oriented and CBT  Diagnosis:PTSD (post-traumatic stress disorder)  Grief at loss of child  Plan: Please see plan in Therapy charts with a target date of 09/23/2021.  The patient approved this plan and is currently maintaining progress.  Patient approved this treatment plan.  Callan Norden G Zona Pedro, LCSW

## 2021-01-14 ENCOUNTER — Ambulatory Visit (INDEPENDENT_AMBULATORY_CARE_PROVIDER_SITE_OTHER): Payer: BC Managed Care – PPO | Admitting: Psychology

## 2021-01-14 ENCOUNTER — Other Ambulatory Visit: Payer: Self-pay

## 2021-01-14 DIAGNOSIS — F4310 Post-traumatic stress disorder, unspecified: Secondary | ICD-10-CM

## 2021-01-14 NOTE — Progress Notes (Signed)
[name] Counselor/Therapist Progress Note  Patient ID: Heidi [name], MRN: [id number],    Date: 01/14/2021  Time Spent: 60 minutes  Treatment Type: Individual Therapy  Reported Symptoms: sadness, irritability,   Mental Status Exam: Appearance:  Casual     Behavior: Appropriate  Motor: Normal  Speech/Language:  Normal Rate  Affect: Depressed  Mood: depressed  Thought process: normal  Thought content:   WNL  Sensory/Perceptual disturbances:   WNL  Orientation: oriented to person, place, time/date, and situation  Attention: Good  Concentration: Good  Memory: WNL  Fund of knowledge:  Good  Insight:   Good  Judgment:  Good  Impulse Control: Good   Risk Assessment: Danger to Self:  No Self-injurious Behavior: No Danger to Others: No Duty to Warn:no Physical Aggression / Violence:No  Access to Firearms a concern: No  Gang Involvement:No   Subjective: The patient attended a face to face individual therapy session in the office today.  The patient presents as a little less depressed today.  The patient said that it was difficult to get through the holidays with the loss of her son.  She talked about not having to be concerned again until mothers day.  The patient says that she was able to get her son in with the Psychiatrist that her other son went to and she feels good about that.  The patient talked today about the suicide of her brother and we began looking at the different events that we want to target with EMDR.  Interventions: Grief Therapy, Insight Oriented and CBT  Diagnosis:PTSD (post-traumatic stress disorder)  Plan: Please see plan in Therapy charts with a target date of 09/23/2021.  The patient approved this plan and is currently maintaining progress.  Patient approved this treatment plan.  Mercie Balsley G Ripley Lovecchio, LCSW

## 2021-01-21 ENCOUNTER — Ambulatory Visit (INDEPENDENT_AMBULATORY_CARE_PROVIDER_SITE_OTHER): Payer: BC Managed Care – PPO | Admitting: Psychology

## 2021-01-21 ENCOUNTER — Other Ambulatory Visit: Payer: Self-pay

## 2021-01-21 DIAGNOSIS — F4321 Adjustment disorder with depressed mood: Secondary | ICD-10-CM

## 2021-01-21 DIAGNOSIS — Z634 Disappearance and death of family member: Secondary | ICD-10-CM

## 2021-01-21 DIAGNOSIS — F4310 Post-traumatic stress disorder, unspecified: Secondary | ICD-10-CM

## 2021-01-21 NOTE — Progress Notes (Signed)
[facility] Counselor/Therapist Progress Note  Patient ID: Heidi [name], MRN: [id number],    Date: 01/21/2021  Time Spent: 60 minutes  Treatment Type: Individual Therapy  Reported Symptoms: sadness, irritability,   Mental Status Exam: Appearance:  Casual     Behavior: Appropriate  Motor: Normal  Speech/Language:  Normal Rate  Affect: Depressed  Mood: depressed  Thought process: normal  Thought content:   WNL  Sensory/Perceptual disturbances:   WNL  Orientation: oriented to person, place, time/date, and situation  Attention: Good  Concentration: Good  Memory: WNL  Fund of knowledge:  Good  Insight:   Good  Judgment:  Good  Impulse Control: Good   Risk Assessment: Danger to Self:  No Self-injurious Behavior: No Danger to Others: No Duty to Warn:no Physical Aggression / Violence:No  Access to Firearms a concern: No  Gang Involvement:No   Subjective: The patient attended a face to face individual therapy session in the office today.  The patient presents as pleasant and cooperative.  The patient states that she  feels like she is doing a little better because she went for 4 days without crying.  The patient talked about her work and needing to look for a job.  The patient talked about dealing with the loss of her son by using being more grateful and positive.  We discussed a little more about what we want to target with EMDR.  We plan to focus on her brothers suicide attempt at 62, also we will likely target his actual suicide and then her sons suicide.    Interventions: Grief Therapy, Insight Oriented and CBT  Diagnosis:PTSD (post-traumatic stress disorder)  Grief at loss of child  Plan: Please see plan in Therapy charts with a target date of 09/23/2021.  The patient approved this plan and is currently maintaining progress.  Patient approved this treatment plan.  Assunta Pupo G Romesha Scherer, LCSW                  Jessamine Barcia G Frederica Chrestman, LCSW

## 2021-01-28 ENCOUNTER — Ambulatory Visit (INDEPENDENT_AMBULATORY_CARE_PROVIDER_SITE_OTHER): Payer: BC Managed Care – PPO | Admitting: Psychology

## 2021-01-28 ENCOUNTER — Other Ambulatory Visit: Payer: Self-pay

## 2021-01-28 DIAGNOSIS — F4321 Adjustment disorder with depressed mood: Secondary | ICD-10-CM | POA: Diagnosis not present

## 2021-01-28 DIAGNOSIS — F4310 Post-traumatic stress disorder, unspecified: Secondary | ICD-10-CM | POA: Diagnosis not present

## 2021-01-28 DIAGNOSIS — Z634 Disappearance and death of family member: Secondary | ICD-10-CM | POA: Diagnosis not present

## 2021-01-28 NOTE — Progress Notes (Signed)
[facility] Counselor/Therapist Progress Note  Patient ID: Heidi [name], MRN: [id number],    Date: 01/28/2021  Time Spent: 60 minutes  Treatment Type: Individual Therapy  Reported Symptoms: sadness, irritability,   Mental Status Exam: Appearance:  Casual     Behavior: Appropriate  Motor: Normal  Speech/Language:  Normal Rate  Affect: Depressed  Mood: depressed  Thought process: normal  Thought content:   WNL  Sensory/Perceptual disturbances:   WNL  Orientation: oriented to person, place, time/date, and situation  Attention: Good  Concentration: Good  Memory: WNL  Fund of knowledge:  Good  Insight:   Good  Judgment:  Good  Impulse Control: Good   Risk Assessment: Danger to Self:  No Self-injurious Behavior: No Danger to Others: No Duty to Warn:no Physical Aggression / Violence:No  Access to Firearms a concern: No  Gang Involvement:No   Subjective: The patient attended a face to face individual therapy session in the office today.  The patient presents with a blunted affect and mood is pleasant.  The patient reports that in the last two days she has had an image of Heidi [name] in her mind that she is having difficulty getting out.  The patient talked about wanting to talk a little today about her insecurities in relationships.  We identified her belief about herself as "I am too much and I am not worth it".  Explained to the patient the process of EMDR and talked about how traumas and beliefs are stored in the brain.  We are going to plan to do EMDR during the next session.  Patient is a little anxious about this.  Will continue to offer information and support.  Interventions: Grief Therapy, Insight Oriented and CBT  Diagnosis:PTSD (post-traumatic stress disorder)  Grief at loss of child  Plan: Please see plan in Therapy charts with a target date of 09/23/2021.  The patient approved this plan and is currently maintaining progress.  Patient approved this  treatment plan.  Emika Tiano G [name], LCSW                  Lacorey Brusca G Elijio Staples, LCSW               Thaddeaus Monica G Caidence Kaseman, LCSW

## 2021-02-04 ENCOUNTER — Other Ambulatory Visit: Payer: Self-pay

## 2021-02-04 ENCOUNTER — Ambulatory Visit (INDEPENDENT_AMBULATORY_CARE_PROVIDER_SITE_OTHER): Payer: BC Managed Care – PPO | Admitting: Psychology

## 2021-02-04 DIAGNOSIS — F4321 Adjustment disorder with depressed mood: Secondary | ICD-10-CM

## 2021-02-04 DIAGNOSIS — F4310 Post-traumatic stress disorder, unspecified: Secondary | ICD-10-CM | POA: Diagnosis not present

## 2021-02-04 DIAGNOSIS — Z634 Disappearance and death of family member: Secondary | ICD-10-CM | POA: Diagnosis not present

## 2021-02-04 NOTE — Progress Notes (Signed)
[facility] Counselor/Therapist Progress Note  Patient ID: Heidi [name], MRN: [id number],    Date: 02/04/2021  Time Spent: 60 minutes  Treatment Type: Individual Therapy  Reported Symptoms: sadness, irritability,   Mental Status Exam: Appearance:  Casual     Behavior: Appropriate  Motor: Normal  Speech/Language:  Normal Rate  Affect: Depressed  Mood: depressed  Thought process: normal  Thought content:   WNL  Sensory/Perceptual disturbances:   WNL  Orientation: oriented to person, place, time/date, and situation  Attention: Good  Concentration: Good  Memory: WNL  Fund of knowledge:  Good  Insight:   Good  Judgment:  Good  Impulse Control: Good   Risk Assessment: Danger to Self:  No Self-injurious Behavior: No Danger to Others: No Duty to Warn:no Physical Aggression / Violence:No  Access to Firearms a concern: No  Gang Involvement:No   Subjective: The patient attended a face to face individual therapy session in the office today.  The patient presents with a depressed mood today.  The patient states that it has been a very rough week.  She had 3 incidences where she was struggling with the loss of her son and being reminded of this loss.  The patient states that she really just does not like her job at all and would like to find something else but she is in a situation where she has to stay for a period of time so she can get some of her student loans paid off.  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  We processed some of these things and talked about EMDR again and the patient has a lot of anxiety about EMDR.  We talked about going at her pace and will continue to do talk therapy at at times and EMDR when she is ready.  The patient talked about the situation of not being able to talk her son out of killing himself and holds a lot of responsibility for this.  We will continue to explore options for her career path and help her  deal with the grief of losing her youngest son.  Interventions: Grief Therapy, Insight Oriented and CBT  Diagnosis:PTSD (post-traumatic stress disorder)  Grief at loss of child  Plan: Please see plan in Therapy charts with a target date of 09/23/2021.  The patient approved this plan and is currently maintaining progress.  Patient approved this treatment plan.  Beatriz Quintela G Sekou Zuckerman, LCSW                  Martavious Hartel G Rutledge Selsor, LCSW               Dinnis Rog G Destanie Tibbetts, LCSW               Capricia Serda G Shikha Bibb, LCSW

## 2021-02-06 ENCOUNTER — Other Ambulatory Visit: Payer: Self-pay | Admitting: Family Medicine

## 2021-02-06 DIAGNOSIS — M359 Systemic involvement of connective tissue, unspecified: Secondary | ICD-10-CM

## 2021-02-06 NOTE — Telephone Encounter (Signed)
Requested Prescriptions  Pending Prescriptions Disp Refills   diclofenac (VOLTAREN) 75 MG EC tablet [Pharmacy Med Name: DICLOFENAC SOD EC 75 MG TAB] 60 tablet 3    Sig: TAKE 1 TABLET BY MOUTH 2 TIMES DAILY AS NEEDED.     Analgesics:  NSAIDS Passed - 02/06/2021 10:25 AM      Passed - Cr in normal range and within 360 days    Creatinine  Date Value Ref Range Status  05/13/2011 0.77 0.60 - 1.30 mg/dL Final   Creatinine, Ser  Date Value Ref Range Status  06/04/2020 0.63 0.44 - 1.00 mg/dL Final         Passed - HGB in normal range and within 360 days    Hemoglobin  Date Value Ref Range Status  06/18/2020 12.7 11.1 - 15.9 g/dL Final         Passed - Patient is not pregnant      Passed - Valid encounter within last 12 months    Recent Outpatient Visits          7 months ago Lower abdominal pain   [facility] [name], MD   10 months ago Other fatigue   [facility] [name], MD   1 year ago Restless leg syndrome   [facility] [name], MD   3 years ago Annual physical exam   [facility] [name], MD   4 years ago [facility]   [facility], [place], [place]

## 2021-02-11 ENCOUNTER — Ambulatory Visit: Payer: BC Managed Care – PPO | Admitting: Psychology

## 2021-02-11 DIAGNOSIS — Z634 Disappearance and death of family member: Secondary | ICD-10-CM

## 2021-02-11 DIAGNOSIS — F4310 Post-traumatic stress disorder, unspecified: Secondary | ICD-10-CM | POA: Diagnosis not present

## 2021-02-11 DIAGNOSIS — F4321 Adjustment disorder with depressed mood: Secondary | ICD-10-CM

## 2021-02-11 NOTE — Progress Notes (Signed)
[facility] Counselor/Therapist Progress Note  Patient ID: Heidi [name], MRN: [id number],    Date: 02/11/2021  Time Spent: 60 minutes  Treatment Type: Individual Therapy  Reported Symptoms: sadness, irritability,   Mental Status Exam: Appearance:  Casual     Behavior: Appropriate  Motor: Normal  Speech/Language:  Normal Rate  Affect: Depressed  Mood: depressed  Thought process: normal  Thought content:   WNL  Sensory/Perceptual disturbances:   WNL  Orientation: oriented to person, place, time/date, and situation  Attention: Good  Concentration: Good  Memory: WNL  Fund of knowledge:  Good  Insight:   Good  Judgment:  Good  Impulse Control: Good   Risk Assessment: Danger to Self:  No Self-injurious Behavior: No Danger to Others: No Duty to Warn:no Physical Aggression / Violence:No  Access to Firearms a concern: No  Gang Involvement:No   Subjective: The patient attended a face to face individual therapy session in the office today.  The patient presents with a blunted affect and mood is depressed.  The patient reports that she has had a stomachache all day.  She reports that this started prior to the loss of her son.  We processed this a little and it seems that she may be having some difficulty with stress and managing her stress level with her job and also the loss of her son.  The main thing she wanted to talk about today was her relationship with her boyfriend.  They have been dating for 8 years and he often times talks about wanting to marry her however he has not proposed to her yet.  We processed her thoughts about this and talked about what she might want to do.  I encouraged her to figure out what it is that she wants and also to think about what she would do if he did not decide to marry her.  Will encourage her to continue to take care of herself and to follow up with medical care if her stomach continues to cause problems. Interventions: Grief  Therapy, Insight Oriented and CBT  Diagnosis:PTSD (post-traumatic stress disorder)  Grief at loss of child  Plan: Please see plan in Therapy charts with a target date of 09/23/2021.  The patient approved this plan and is currently maintaining progress.  Patient approved this treatment plan.  Cerita Rabelo G Daleena Rotter, LCSW                  Miaa Latterell G Lincon Sahlin, LCSW               Brittannie Tawney G Butch Otterson, LCSW               Marissa Lowrey G Webber Michiels, LCSW               Neomi Laidler G Kadden Osterhout, LCSW

## 2021-02-15 ENCOUNTER — Telehealth: Payer: Self-pay

## 2021-02-15 NOTE — Telephone Encounter (Signed)
Patient is having abdominal pain. She states it was so bad on Saturday she thought about going to the ER. She did not go and the pain is better this morning but she does not know what is going on. Made appointment for her on 02/17/21 arrived at 3:15

## 2021-02-17 ENCOUNTER — Ambulatory Visit (INDEPENDENT_AMBULATORY_CARE_PROVIDER_SITE_OTHER): Payer: BC Managed Care – PPO | Admitting: Gastroenterology

## 2021-02-17 ENCOUNTER — Encounter: Payer: Self-pay | Admitting: Gastroenterology

## 2021-02-17 ENCOUNTER — Other Ambulatory Visit: Payer: Self-pay

## 2021-02-17 VITALS — BP 157/91 | HR 58 | Temp 99.1°F | Ht 68.0 in | Wt 183.8 lb

## 2021-02-17 DIAGNOSIS — R1013 Epigastric pain: Secondary | ICD-10-CM

## 2021-02-17 NOTE — Progress Notes (Signed)
Cephas Darby, MD [address]  [place]  [place], [place] [address]  Main: [phone]  Fax: [phone]    Gastroenterology Consultation  Referring Provider:     Birdie Sons, MD Primary Care Physician:  Birdie Sons, MD Primary Gastroenterologist:  Dr. Cephas Darby Reason for Consultation: Upper abdominal pain, nausea        HPI:   Heidi [name] is a 46 y.o. female referred by Dr. Birdie Sons, MD  for consultation & management of several months history of lower abdominal pain associated with abdominal bloating, increased bowel frequency.  Patient reports that her bowel movements have always been mushy to runny, on Bristol stool scale from 5-7.  She denies any rectal bleeding.  She does have left lower quadrant discomfort.  She had CT abdomen and pelvis without contrast which did not reveal any acute intra-abdominal pathology.  She did have sigmoid diverticulosis.  Patient does have history of iron deficiency as well as macrocytosis from severe B12 deficiency.  She took B12 supplementation for 2 months that has resulted in normal B12 levels.  No evidence of anemia.  Patient denies any weight loss.  She does have chronic fatigue.  Patient reports mother with colon cancer in her 10s  Follow-up visit 02/17/2021 Patient made an urgent visit to see me today.  Last Thursday, patient developed sharp, severe epigastric pain radiating to bilateral subcostal margins and back associated with nausea.  The episodes generally last about 5 to 15 minutes.  She had these episodes until Saturday.  She was fine Sunday.  She had another episode on Monday, call the office.  She did not want to go to ER.  Patient has baseline functional GI symptoms.  She lost her 78 year old son about 6 months ago.  She is coping well.   NSAIDs: None  Antiplts/Anticoagulants/Anti thrombotics: None  GI Procedures:  Patient had a colonoscopy by Dr. Vira Agar on 03/13/2018, normal, biopsies were not  performed  Colonoscopy 10/14/2013, normal Diagnosis:  RANDOM COLON COLD BIOPSY:  - COLONIC MUCOSA WITH FOCAL LYMPHOID AGGREGATES.  - NEGATIVE FOR MICROSCOPIC COLITIS.   Past Medical History:  Diagnosis Date   Anxiety    Asthma    Clotting disorder ([place])    DVT   Depression    DVT (deep venous thrombosis) ([facility])    Family history of colon cancer in mother    Family history of ovarian cancer    4/21 cancer genetic tesitng letter sent   Herpes genitalis    Lupus ([place])     Past Surgical History:  Procedure Laterality Date   ABLATION     COLONOSCOPY  2011   ENDOMETRIAL BIOPSY     NASAL SEPTUM SURGERY     NOSE SURGERY  1995   deviated nose septum   TONSILLECTOMY  1995   TUBAL LIGATION  2009    Current Outpatient Medications:    Cholecalciferol (VITAMIN D3) 1.25 MG (50000 UT) CAPS, Take 1 capsule by mouth once a week., Disp: , Rfl:    diclofenac (VOLTAREN) 75 MG EC tablet, TAKE 1 TABLET BY MOUTH 2 TIMES DAILY AS NEEDED., Disp: 60 tablet, Rfl: 3   eszopiclone (LUNESTA) 1 MG TABS tablet, Take 1 mg by mouth at bedtime., Disp: , Rfl:    gabapentin (NEURONTIN) 300 MG capsule, TAKE 1-2 CAPSULES (300-600 MG TOTAL) BY MOUTH AT BEDTIME., Disp: 60 capsule, Rfl: 3   Iron-FA-B Cmp-C-Biot-Probiotic (FUSION PLUS) CAPS, Take 1 capsule by mouth daily., Disp:  30 capsule, Rfl: 0   lamoTRIgine (LAMICTAL) 200 MG tablet, Take by mouth every morning., Disp: , Rfl:    traZODone (DESYREL) 50 MG tablet, Take 50 mg by mouth at bedtime as needed., Disp: , Rfl:    valACYclovir (VALTREX) 1000 MG tablet, TAKE 1 TABLET BY MOUTH EVERY DAY, Disp: 90 tablet, Rfl: 1    Family History  Problem Relation Age of Onset   Cancer Mother 57       colon, rectal   Diabetes Father    Uterine cancer Maternal Aunt    Bladder Cancer Maternal Grandmother    Bladder Cancer Maternal Grandfather      Social History   Tobacco Use   Smoking status: Former    Packs/day: 0.50    Years: 29.00    Pack years: 14.50     Types: Cigarettes    Quit date: 01/09/2013    Years since quitting: 8.1   Smokeless tobacco: Never  Vaping Use   Vaping Use: Never used  Substance Use Topics   Alcohol use: Yes    Comment: occasional   Drug use: No    Allergies as of 02/17/2021 - Review Complete 02/17/2021  Allergen Reaction Noted   Zyprexa [olanzapine]  04/03/2020    Review of Systems:    All systems reviewed and negative except where noted in HPI.   Physical Exam:  BP (!) 157/91 (BP Location: Right Arm, Patient Position: Sitting, Cuff Size: Normal)    Pulse (!) 58    Temp 99.1 F (37.3 C) (Oral)    Ht 5\' 8"  (1.727 m)    Wt 183 lb 12.8 oz (83.4 kg)    BMI 27.95 kg/m  No LMP recorded.  General:   Alert,  Well-developed, well-nourished, pleasant and cooperative in NAD Head:  Normocephalic and atraumatic. Eyes:  Sclera clear, no icterus.   Conjunctiva pink. Ears:  Normal auditory acuity. Nose:  No deformity, discharge, or lesions. Mouth:  No deformity or lesions,oropharynx pink & moist. Neck:  Supple; no masses or thyromegaly. Lungs:  Respirations even and unlabored.  Clear throughout to auscultation.   No wheezes, crackles, or rhonchi. No acute distress. Heart:  Regular rate and rhythm; no murmurs, clicks, rubs, or gallops. Abdomen:  Normal bowel sounds. Soft, non-tender and non-distended without masses, hepatosplenomegaly or hernias noted.  No guarding or rebound tenderness.   Rectal: Not performed Msk:  Symmetrical without gross deformities. Good, equal movement & strength bilaterally. Pulses:  Normal pulses noted. Extremities:  No clubbing or edema.  No cyanosis. Neurologic:  Alert and oriented x3;  grossly normal neurologically. Skin:  Intact without significant lesions or rashes. No jaundice. Psych:  Alert and cooperative. Normal mood and affect.  Imaging Studies: Reviewed  Assessment and Plan:   Heidi [name] is a 46 y.o. female with history of DVT, history of diarrhea predominant IBS is seen  in consultation for 1 week history of sharp epigastric pain, radiating to back associated with nausea  Celiac disease panel, H. pylori breath test, pancreatic fecal elastase levels, fecal calprotectin levels, GI profile PCR for work-up of diarrhea were negative in the past  Epigastric pain and nausea Check LFTs, serum lipase Recommend EGD Recommend right upper quadrant ultrasound to evaluate for cholelithiasis  History of iron and B12 deficiency without anemia Continue fusion plus Recheck iron panel by her PCP  Follow up based on the above work-up  Cephas Darby, MD

## 2021-02-18 ENCOUNTER — Ambulatory Visit: Payer: BC Managed Care – PPO | Admitting: Psychology

## 2021-02-18 DIAGNOSIS — F4310 Post-traumatic stress disorder, unspecified: Secondary | ICD-10-CM

## 2021-02-18 DIAGNOSIS — F4321 Adjustment disorder with depressed mood: Secondary | ICD-10-CM | POA: Diagnosis not present

## 2021-02-18 DIAGNOSIS — Z634 Disappearance and death of family member: Secondary | ICD-10-CM

## 2021-02-18 LAB — HEPATIC FUNCTION PANEL
ALT: 7 IU/L (ref 0–32)
AST: 8 IU/L (ref 0–40)
Albumin: 4.3 g/dL (ref 3.8–4.8)
Alkaline Phosphatase: 60 IU/L (ref 44–121)
Bilirubin Total: 0.3 mg/dL (ref 0.0–1.2)
Bilirubin, Direct: <0.1 mg/dL (ref 0.00–0.40)
Total Protein: 6.7 g/dL (ref 6.0–8.5)

## 2021-02-18 LAB — LIPASE: Lipase: 23 U/L (ref 14–72)

## 2021-02-18 NOTE — Progress Notes (Signed)
[facility] Counselor/Therapist Progress Note  Patient ID: Alejandra Hunt, MRN: [id number],    Date: 02/18/2021  Time Spent: 60 minutes  Treatment Type: Individual Therapy  Reported Symptoms: sadness, irritability,   Mental Status Exam: Appearance:  Casual     Behavior: Appropriate  Motor: Normal  Speech/Language:  Normal Rate  Affect: Depressed  Mood: depressed  Thought process: normal  Thought content:   WNL  Sensory/Perceptual disturbances:   WNL  Orientation: oriented to person, place, time/date, and situation  Attention: Good  Concentration: Good  Memory: WNL  Fund of knowledge:  Good  Insight:   Good  Judgment:  Good  Impulse Control: Good   Risk Assessment: Danger to Self:  No Self-injurious Behavior: No Danger to Others: No Duty to Warn:no Physical Aggression / Violence:No  Access to Firearms a concern: No  Gang Involvement:No   Subjective: The patient attended a face to face individual therapy session in the office today.  The patient presents with a blunted affect and mood is pleasant.  The patient reports that she ended up going to the GI doctor yesterday and they are going to do an endoscopy and do some other test.  The patient states that she had some problems over the weekend with lots of pain in her stomach and back.  The patient discussed being frustrated with being physically ill.  She also talked about having a conversation with her boyfriend and they are probably going to get engaged in the next few months.  The other issue that she brought up today was her other son and her concerns about him not sharing with her his feelings about his brother's death.  We processed this and I explained to her that maybe he needs to do his own process and may not feel comfortable talking about his feelings necessarily with her.  I did give her positive feedback because she is very open to talking with her son and he seems to be open to talking with her if he  needs to.  Interventions: Grief Therapy, Insight Oriented and CBT  Diagnosis:PTSD (post-traumatic stress disorder)  Grief at loss of child  Plan: Please see plan in Therapy charts with a target date of 09/23/2021.  The patient approved this plan and is currently maintaining progress.  Patient approved this treatment plan.  Rahkeem Senft G Keesha Pellum, LCSW                  Darryl Blumenstein G Alcario Tinkey, LCSW               Kawhi Diebold G Rondia Higginbotham, LCSW               Raelin Pixler G Saba Gomm, LCSW               Jago Carton G Romin Divita, LCSW               Kya Mayfield G Madoc Holquin, LCSW

## 2021-02-24 ENCOUNTER — Ambulatory Visit
Admission: RE | Admit: 2021-02-24 | Discharge: 2021-02-24 | Disposition: A | Discharge status: Home / Self Care | Payer: BC Managed Care – PPO | Source: Ambulatory Visit | Attending: Gastroenterology | Admitting: Gastroenterology

## 2021-02-24 ENCOUNTER — Encounter: Payer: Self-pay | Admitting: Family Medicine

## 2021-02-24 ENCOUNTER — Other Ambulatory Visit: Payer: Self-pay

## 2021-02-24 ENCOUNTER — Ambulatory Visit: Payer: BC Managed Care – PPO | Admitting: Family Medicine

## 2021-02-24 VITALS — BP 131/94 | HR 72 | Temp 98.6°F | Wt 182.0 lb

## 2021-02-24 DIAGNOSIS — R1013 Epigastric pain: Secondary | ICD-10-CM | POA: Diagnosis present

## 2021-02-24 DIAGNOSIS — J069 Acute upper respiratory infection, unspecified: Secondary | ICD-10-CM | POA: Diagnosis not present

## 2021-02-24 DIAGNOSIS — F4310 Post-traumatic stress disorder, unspecified: Secondary | ICD-10-CM

## 2021-02-24 DIAGNOSIS — R1084 Generalized abdominal pain: Secondary | ICD-10-CM | POA: Diagnosis not present

## 2021-02-24 DIAGNOSIS — J4521 Mild intermittent asthma with (acute) exacerbation: Secondary | ICD-10-CM | POA: Diagnosis not present

## 2021-02-24 MED ORDER — PREDNISONE 10 MG (21) PO TBPK: ORAL_TABLET | ORAL | 0 refills | Status: DC

## 2021-02-24 MED ORDER — ALBUTEROL SULFATE HFA 108 (90 BASE) MCG/ACT IN AERS: 2.0000 | INHALATION_SPRAY | Freq: Four times a day (QID) | RESPIRATORY_TRACT | 2 refills | Status: DC | PRN

## 2021-02-24 NOTE — Patient Instructions (Signed)
Recommend OTC, over the counter purchase, to assist with recovery: ° °Nasal spray- Ocean nasal spray or similar, to ensure nasal passageways are moist and not dry ° °Nasal steroid- Flonase, or similar, assist with decrease inflammation, may not notice effect for 4 days or longer, can take 2 x/day. Best to blow nose prior to use.  ° °Delym or Robitussin DM- to assist with cough, follow directions on package ° °Mucinex DM- ensure 32-64 oz of water to activate ° °Throat lozenges, sugar free candies, water- keep throat moistened  ° °

## 2021-02-24 NOTE — Progress Notes (Signed)
Acute Office Visit  Subjective:    Patient ID: Heidi [name], female    DOB: 1975/02/04, 46 y.o.   MRN: [id number]  No chief complaint on file.   HPI Patient is in today for evaluation of cough.  Patient states she started having some throat fullness on 02/19/21.  Over the weekend it progressed into non productive cough, ear pain/fullness and general malaise.  She states she has not had fever but she is not sleeping well and has worsened fatigue.   She tested negative for Covid on 212/23  Past Medical History:  Diagnosis Date   Anxiety    Asthma    Clotting disorder ([place])    DVT   Depression    DVT (deep venous thrombosis) (HCC)    Family history of colon cancer in mother    Family history of ovarian cancer    4/21 cancer genetic tesitng letter sent   Herpes genitalis    Lupus ([place])     Past Surgical History:  Procedure Laterality Date   ABLATION     COLONOSCOPY  2011   ENDOMETRIAL BIOPSY     NASAL SEPTUM SURGERY     NOSE SURGERY  1995   deviated nose septum   TONSILLECTOMY  1995   TUBAL LIGATION  2009    Family History  Problem Relation Age of Onset   Cancer Mother 48       colon, rectal   Diabetes Father    Uterine cancer Maternal Aunt    Bladder Cancer Maternal Grandmother    Bladder Cancer Maternal Grandfather     Social History   Socioeconomic History   Marital status: Divorced    Spouse name: Not on file   Number of children: Not on file   Years of education: Not on file   Highest education level: Not on file  Occupational History   Not on file  Tobacco Use   Smoking status: Former    Packs/day: 0.50    Years: 29.00    Pack years: 14.50    Types: Cigarettes    Quit date: 01/09/2013    Years since quitting: 8.1   Smokeless tobacco: Never  Vaping Use   Vaping Use: Never used  Substance and Sexual Activity   Alcohol use: Yes    Comment: occasional   Drug use: No   Sexual activity: Yes    Birth control/protection: Surgical  Other  Topics Concern   Not on file  Social History Narrative   Not on file   Social Determinants of Health   Financial Resource Strain: Not on file  Food Insecurity: Not on file  Transportation Needs: Not on file  Physical Activity: Not on file  Stress: Not on file  Social Connections: Not on file  Intimate Partner Violence: Not on file    Outpatient Medications Prior to Visit  Medication Sig Dispense Refill   Cholecalciferol (VITAMIN D3) 1.25 MG (50000 UT) CAPS Take 1 capsule by mouth once a week.     diclofenac (VOLTAREN) 75 MG EC tablet TAKE 1 TABLET BY MOUTH 2 TIMES DAILY AS NEEDED. 60 tablet 3   eszopiclone (LUNESTA) 1 MG TABS tablet Take 1 mg by mouth at bedtime.     gabapentin (NEURONTIN) 300 MG capsule TAKE 1-2 CAPSULES (300-600 MG TOTAL) BY MOUTH AT BEDTIME. 60 capsule 3   lamoTRIgine (LAMICTAL) 200 MG tablet Take by mouth every morning.     valACYclovir (VALTREX) 1000 MG tablet TAKE 1 TABLET BY MOUTH EVERY  DAY 90 tablet 1   Iron-FA-B Cmp-C-Biot-Probiotic (FUSION PLUS) CAPS Take 1 capsule by mouth daily. 30 capsule 0   traZODone (DESYREL) 50 MG tablet Take 50 mg by mouth at bedtime as needed.     No facility-administered medications prior to visit.    Allergies  Allergen Reactions   Zyprexa [Olanzapine]     Aggravates restless legs    Review of Systems  Constitutional:  Positive for fatigue. Negative for fever.  HENT:  Positive for congestion, ear pain, sinus pressure and sinus pain. Negative for postnasal drip, rhinorrhea, sneezing and sore throat.   Respiratory:  Positive for cough. Negative for shortness of breath and wheezing.   Cardiovascular:  Negative for chest pain and palpitations.      Objective:    Physical Exam Vitals and nursing note reviewed.  Constitutional:      General: She is not in acute distress.    Appearance: Normal appearance. She is overweight. She is not ill-appearing, toxic-appearing or diaphoretic.  HENT:     Head: Normocephalic and  atraumatic.     Right Ear: Tympanic membrane, ear canal and external ear normal. There is no impacted cerumen.     Left Ear: Tympanic membrane, ear canal and external ear normal. There is no impacted cerumen.     Nose: Congestion present.     Right Sinus: No maxillary sinus tenderness or frontal sinus tenderness.     Left Sinus: No maxillary sinus tenderness or frontal sinus tenderness.     Mouth/Throat:     Mouth: Mucous membranes are moist.     Pharynx: No posterior oropharyngeal erythema.  Eyes:     Pupils: Pupils are equal, round, and reactive to light.  Cardiovascular:     Rate and Rhythm: Normal rate and regular rhythm.     Pulses: Normal pulses.     Heart sounds: Normal heart sounds. No murmur heard.   No friction rub. No gallop.  Pulmonary:     Effort: Pulmonary effort is normal. No respiratory distress.     Breath sounds: Normal breath sounds. No stridor. No wheezing, rhonchi or rales.  Chest:     Chest wall: No tenderness.  Abdominal:     General: Bowel sounds are normal.     Palpations: Abdomen is soft.  Musculoskeletal:        General: No swelling, tenderness, deformity or signs of injury. Normal range of motion.     Right lower leg: No edema.     Left lower leg: No edema.  Lymphadenopathy:     Cervical: Cervical adenopathy present.  Skin:    General: Skin is warm and dry.     Capillary Refill: Capillary refill takes less than 2 seconds.     Coloration: Skin is not jaundiced or pale.     Findings: No bruising, erythema, lesion or rash.  Neurological:     General: No focal deficit present.     Mental Status: She is alert and oriented to person, place, and time. Mental status is at baseline.     Cranial Nerves: No cranial nerve deficit.     Sensory: No sensory deficit.     Motor: No weakness.     Coordination: Coordination normal.  Psychiatric:        Mood and Affect: Mood normal.        Behavior: Behavior normal.        Thought Content: Thought content normal.         Judgment: Judgment normal.  BP (!) 131/94 (BP Location: Right Arm, Patient Position: Sitting, Cuff Size: Normal)    Pulse 72    Temp 98.6 F (37 C) (Oral)    Wt 182 lb (82.6 kg)    SpO2 100%    BMI 27.67 kg/m  Wt Readings from Last 3 Encounters:  02/24/21 182 lb (82.6 kg)  02/17/21 183 lb 12.8 oz (83.4 kg)  09/16/20 191 lb (86.6 kg)    Health Maintenance Due  Topic Date Due   Hepatitis C Screening  Never done   COVID-19 Vaccine (3 - Pfizer risk series) 05/08/2019    There are no preventive care reminders to display for this patient.   Lab Results  Component Value Date   TSH 1.130 04/03/2020   Lab Results  Component Value Date   WBC 7.6 06/18/2020   HGB 12.7 06/18/2020   HCT 37.5 06/18/2020   MCV 100 (H) 06/18/2020   PLT 341 06/18/2020   Lab Results  Component Value Date   NA 139 06/04/2020   K 4.1 06/04/2020   CO2 26 06/04/2020   GLUCOSE 107 (H) 06/04/2020   BUN 11 06/04/2020   CREATININE 0.63 06/04/2020   BILITOT 0.3 02/17/2021   ALKPHOS 60 02/17/2021   AST 8 02/17/2021   ALT 7 02/17/2021   PROT 6.7 02/17/2021   ALBUMIN 4.3 02/17/2021   CALCIUM 9.2 06/04/2020   ANIONGAP 7 06/04/2020   EGFR 92 04/03/2020   Lab Results  Component Value Date   CHOL 116 04/09/2019   Lab Results  Component Value Date   HDL 41 04/09/2019   Lab Results  Component Value Date   LDLCALC 63 04/09/2019   Lab Results  Component Value Date   TRIG 51 04/09/2019   Lab Results  Component Value Date   CHOLHDL 2.8 04/09/2019   No results found for: HGBA1C     Assessment & Plan:   Problem List Items Addressed This Visit       Respiratory   Mild intermittent asthma with acute exacerbation - Primary    Treat with prednisone and PRN inhaler      Relevant Medications   predniSONE (STERAPRED UNI-PAK 21 TAB) 10 MG (21) TBPK tablet   albuterol (VENTOLIN HFA) 108 (90 Base) MCG/ACT inhaler   Viral URI with cough    Works as Pharmacist, hospital, 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 Best to blow nose prior to use.   Delym or Robitussin DM- to assist with cough, follow directions on package  Mucinex DM- ensure 32-64 oz of water to activate  Throat lozenges, sugar free candies, water- keep throat moistened          Other   Generalized abdominal pain    Hx of abdominal pain; seen at GI- negative [place] for gallbladder workup, plan for endoscopy Handout on FOD-MAP provided      PTSD (post-traumatic stress disorder)    F/b psych and counseling        Meds ordered this encounter  Medications   predniSONE (STERAPRED UNI-PAK 21 TAB) 10 MG (21) TBPK tablet    Sig: Take as directed on package; recommend morning dosing with food.    Dispense:  1 each    Refill:  0    Order Specific Question:   Supervising Provider    Answer:   Virginia Crews [[id number]]   albuterol (VENTOLIN HFA) 108 (90 Base) MCG/ACT inhaler    Sig: Inhale 2 puffs into the lungs every 6 (six) hours as needed for wheezing or shortness of breath.    Dispense:  8 g    Refill:  2    Order Specific Question:   Supervising Provider    Answer:   Virginia Crews [[id number]]   Ar[place] Ponder [place],acting as a scribe for Gwyneth Sprout, FNP.,have documented all relevant documentation on the behalf of Gwyneth Sprout, FNP,as directed by  Gwyneth Sprout, FNP while in the presence of Gwyneth Sprout, FNP.  Vonna Kotyk, FNP, have reviewed all documentation for this visit. The documentation on 02/24/21 for the exam, diagnosis, procedures, and orders are all accurate and complete.   Gwyneth Sprout, FNP

## 2021-02-24 NOTE — Assessment & Plan Note (Signed)
F/b psych and counseling

## 2021-02-24 NOTE — Assessment & Plan Note (Signed)
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 Best to blow nose prior to use.   Delym or Robitussin DM- to assist with cough, follow directions on package  Mucinex DM- ensure 32-64 oz of water to activate  Throat lozenges, sugar free candies, water- keep throat moistened

## 2021-02-24 NOTE — Assessment & Plan Note (Signed)
Treat with prednisone and PRN inhaler

## 2021-02-24 NOTE — Assessment & Plan Note (Signed)
Hx of abdominal pain; seen at GI- negative [place] for gallbladder workup, plan for endoscopy Handout on FOD-MAP provided

## 2021-02-25 ENCOUNTER — Ambulatory Visit: Payer: BC Managed Care – PPO | Admitting: Psychology

## 2021-02-25 DIAGNOSIS — Z634 Disappearance and death of family member: Secondary | ICD-10-CM

## 2021-02-25 DIAGNOSIS — F4310 Post-traumatic stress disorder, unspecified: Secondary | ICD-10-CM | POA: Diagnosis not present

## 2021-02-25 DIAGNOSIS — F4321 Adjustment disorder with depressed mood: Secondary | ICD-10-CM | POA: Diagnosis not present

## 2021-02-25 NOTE — Progress Notes (Signed)
[facility] Counselor/Therapist Progress Note  Patient ID: Heidi [name], MRN: [id number],    Date: 02/25/2021  Time Spent: 60 minutes  Treatment Type: Individual Therapy  Reported Symptoms: sadness, irritability,   Mental Status Exam: Appearance:  Casual     Behavior: Appropriate  Motor: Normal  Speech/Language:  Normal Rate  Affect: Depressed  Mood: depressed  Thought process: normal  Thought content:   WNL  Sensory/Perceptual disturbances:   WNL  Orientation: oriented to person, place, time/date, and situation  Attention: Good  Concentration: Good  Memory: WNL  Fund of knowledge:  Good  Insight:   Good  Judgment:  Good  Impulse Control: Good   Risk Assessment: Danger to Self:  No Self-injurious Behavior: No Danger to Others: No Duty to Warn:no Physical Aggression / Violence:No  Access to Firearms a concern: No  Gang Involvement:No   Subjective: The patient attended a face to face individual therapy session in the office today.  The patient presents as pleasant and cooperative.  The patient states that her stomach is doing better this week.  She reports that she had an ultrasound and that was fine.  She really does not want to have an endoscopy however they are probably still going to recommend it.  The patient talked about her grief over her son and she seems to be coping much better with this today.  Today was the 56-month anniversary of his death date.  She also talked about buying her other son in a car and that what made her very happy.  We discussed her relationship and they seem to be doing well as well.  The patient says that she feels better this week and we talked about discontinuing what she is doing to manage her grief.  Interventions: Grief Therapy, Insight Oriented and CBT  Diagnosis:No diagnosis found.  Plan: Please see plan in Therapy charts with a target date of 09/23/2021.  The patient approved this plan and is currently maintaining  progress.  Patient approved this treatment plan.  Mavrick Mcquigg G Vashon Arch, LCSW                  Ellason Segar G Leaann Nevils, LCSW               Leeroy Lovings G Rakisha Pincock, LCSW               Nazanin Kinner G Norvil Martensen, LCSW               Asharia Lotter G Patrena Santalucia, LCSW               Aubreana Cornacchia G Sulaiman Imbert, LCSW               Bryce Cheever G Sidrah Harden, LCSW

## 2021-03-04 ENCOUNTER — Other Ambulatory Visit: Payer: Self-pay

## 2021-03-04 ENCOUNTER — Ambulatory Visit: Payer: BC Managed Care – PPO | Admitting: Psychology

## 2021-03-04 DIAGNOSIS — F4321 Adjustment disorder with depressed mood: Secondary | ICD-10-CM | POA: Diagnosis not present

## 2021-03-04 DIAGNOSIS — F4310 Post-traumatic stress disorder, unspecified: Secondary | ICD-10-CM | POA: Diagnosis not present

## 2021-03-04 DIAGNOSIS — Z634 Disappearance and death of family member: Secondary | ICD-10-CM | POA: Diagnosis not present

## 2021-03-04 NOTE — Progress Notes (Signed)
[facility] Counselor/Therapist Progress Note  Patient ID: Heidi [name], MRN: [id number],    Date: 03/04/2021  Time Spent: 60 minutes  Treatment Type: Individual Therapy  Reported Symptoms: sadness, irritability,   Mental Status Exam: Appearance:  Casual     Behavior: Appropriate  Motor: Normal  Speech/Language:  Normal Rate  Affect: Depressed  Mood: depressed  Thought process: normal  Thought content:   WNL  Sensory/Perceptual disturbances:   WNL  Orientation: oriented to person, place, time/date, and situation  Attention: Good  Concentration: Good  Memory: WNL  Fund of knowledge:  Good  Insight:   Good  Judgment:  Good  Impulse Control: Good   Risk Assessment: Danger to Self:  No Self-injurious Behavior: No Danger to Others: No Duty to Warn:no Physical Aggression / Violence:No  Access to Firearms a concern: No  Gang Involvement:No   Subjective: The patient attended a face to face individual therapy session in the office today.  The patient presents as pleasant and cooperative.  She has make-up on today.  The patient talked about some of her concerns about school and about wanting to find other employment.  The patient talked about wanting to be helpful to people and especially children.  We did some brainstorming about what she might be interested in and ask her a question about what she would do if she had a passion that she loved and what kind of job would fit into that category.  The patient seems to be doing very well with the loss of her son and she even talked about wanting to possibly write a book to help other grieving parents.  We will continue to work with patient on grief issues as well as other concerns she might have.  Interventions: Grief Therapy, Insight Oriented and CBT  Diagnosis:PTSD (post-traumatic stress disorder)  Grief at loss of child  Plan: Please see plan in Therapy charts with a target date of 09/23/2021.  The patient  approved this plan and is currently maintaining progress.  Patient approved this treatment plan.  Etai Copado G Gwynne Kemnitz, LCSW                  Anderia Lorenzo G Iolanda Folson, LCSW               Jonan Seufert G Cloy Cozzens, LCSW               Yitzhak Awan G Samnang Shugars, LCSW               Braylin Xu G Camille Dragan, LCSW               Elier Zellars G Zully Frane, LCSW               Leita Lindbloom G Lovel Suazo, LCSW               Taira Knabe G Gerber Penza, LCSW

## 2021-03-10 ENCOUNTER — Ambulatory Visit: Payer: BC Managed Care – PPO | Admitting: Anesthesiology

## 2021-03-10 ENCOUNTER — Ambulatory Visit
Admission: RE | Admit: 2021-03-10 | Discharge: 2021-03-10 | Disposition: A | Discharge status: Home / Self Care | Payer: BC Managed Care – PPO | Source: Ambulatory Visit | Attending: Gastroenterology | Admitting: Gastroenterology

## 2021-03-10 ENCOUNTER — Other Ambulatory Visit: Payer: Self-pay

## 2021-03-10 ENCOUNTER — Encounter: Payer: Self-pay | Admitting: Gastroenterology

## 2021-03-10 ENCOUNTER — Encounter
Admission: RE | Disposition: A | Discharge status: Home / Self Care | Payer: Self-pay | Source: Ambulatory Visit | Attending: Gastroenterology

## 2021-03-10 DIAGNOSIS — R1013 Epigastric pain: Secondary | ICD-10-CM | POA: Insufficient documentation

## 2021-03-10 HISTORY — PX: ESOPHAGOGASTRODUODENOSCOPY (EGD) WITH PROPOFOL: SHX5813

## 2021-03-10 LAB — POCT PREGNANCY, URINE: Preg Test, Ur: NEGATIVE

## 2021-03-10 SURGERY — ESOPHAGOGASTRODUODENOSCOPY (EGD) WITH PROPOFOL
Anesthesia: General

## 2021-03-10 MED ORDER — SODIUM CHLORIDE 0.9 % IV SOLN: INTRAVENOUS | Status: DC

## 2021-03-10 MED ORDER — FENTANYL CITRATE (PF) 100 MCG/2ML IJ SOLN
INTRAMUSCULAR | Status: DC | PRN
  Administered 2021-03-10: 50 ug via INTRAVENOUS

## 2021-03-10 MED ORDER — FENTANYL CITRATE (PF) 100 MCG/2ML IJ SOLN
INTRAMUSCULAR | Status: AC
  Filled 2021-03-10: qty 2

## 2021-03-10 MED ORDER — PROPOFOL 500 MG/50ML IV EMUL
INTRAVENOUS | Status: DC | PRN
  Administered 2021-03-10: 150 ug/kg/min via INTRAVENOUS

## 2021-03-10 NOTE — Anesthesia Postprocedure Evaluation (Signed)
Anesthesia Post Note ? ?Patient: Heidi [name] ? ?Procedure(s) Performed: ESOPHAGOGASTRODUODENOSCOPY (EGD) WITH PROPOFOL ? ?Patient location during evaluation: PACU ?Anesthesia Type: General ?Level of consciousness: awake and awake and alert ?Pain management: pain level controlled ?Vital Signs Assessment: post-procedure vital signs reviewed and stable ?Respiratory status: spontaneous breathing and nonlabored ventilation ?Cardiovascular status: stable ?Anesthetic complications: no ? ? ?No notable events documented. ? ? ?Last Vitals:  ?Vitals:  ? 03/10/21 0936 03/10/21 0940  ?BP: 99/68 99/63  ?Pulse: (!) 57 (!) 58  ?Resp: 17 17  ?Temp:    ?SpO2: 96% 96%  ?  ?Last Pain:  ?Vitals:  ? 03/10/21 0932  ?TempSrc: Temporal  ?PainSc:   ? ? ?  ?  ?  ?  ?  ?  ? ?VAN STAVEREN,Makella Buckingham ? ? ? ? ?

## 2021-03-10 NOTE — H&P (Signed)
?Heidi Repress, MD ?[address]  ?[address]  ?[place], [place] [address]  ?Main: [phone]  ?Fax: [phone] ?Pager: [phone] ? ?Primary Care Physician:  Heidi Limes, MD ?Primary Gastroenterologist:  Dr. Arlyss [name] ? ?Pre-Procedure History & Physical: ?HPI:  Heidi [name] is a 46 y.o. female is here for an endoscopy. ?  ?Past Medical History:  ?Diagnosis Date  ? Anxiety   ? Asthma   ? Clotting disorder ([facility]CC)   ? DVT  ? Depression   ? DVT (deep venous thrombosis) (HCC)   ? Family history of colon cancer in mother   ? Family history of ovarian cancer   ? 4/21 cancer genetic tesitng letter sent  ? Herpes genitalis   ? Lupus ([facility])   ? ? ?Past Surgical History:  ?Procedure Laterality Date  ? ABLATION    ? COLONOSCOPY  2011  ? ENDOMETRIAL BIOPSY    ? NASAL SEPTUM SURGERY    ? NOSE SURGERY  1995  ? deviated nose septum  ? TONSILLECTOMY  1995  ? TUBAL LIGATION  2009  ? ? ?Prior to Admission medications   ?Medication Sig Start Date End Date Taking? Authorizing Provider  ?Cholecalciferol (VITAMIN D3) 1.25 MG (50000 UT) CAPS Take 1 capsule by mouth once a week. 02/22/20  Yes [provider]  ?diclofenac (VOLTAREN) 75 MG EC tablet TAKE 1 TABLET BY MOUTH 2 TIMES DAILY AS NEEDED. 02/06/21  Yes Heidi Limes, MD  ?eszopiclone (LUNESTA) 1 MG TABS tablet Take 1 mg by mouth at bedtime. 02/09/21  Yes [provider]  ?gabapentin (NEURONTIN) 300 MG capsule TAKE 1-2 CAPSULES (300-600 MG TOTAL) BY MOUTH AT BEDTIME. 11/30/20  Yes Heidi Limes, MD  ?lamoTRIgine (LAMICTAL) 200 MG tablet Take by mouth every morning. 01/20/21  Yes [provider]  ?valACYclovir (VALTREX) 1000 MG tablet TAKE 1 TABLET BY MOUTH EVERY DAY 09/10/20  Yes Heidi Austria, MD  ?albuterol (VENTOLIN HFA) 108 (90 Base) MCG/ACT inhaler Inhale 2 puffs into the lungs every 6 (six) hours as needed for wheezing or shortness of breath. 02/24/21   Jacky Kindle, FNP  ?predniSONE (STERAPRED UNI-PAK 21 TAB) 10 MG (21)  TBPK tablet Take as directed on package; recommend morning dosing with food. ?Patient not taking: Reported on 03/10/2021 02/24/21   Jacky Kindle, FNP  ? ? ?Allergies as of 02/17/2021 - Review Complete 02/17/2021  ?Allergen Reaction Noted  ? Zyprexa [olanzapine]  04/03/2020  ? ? ?Family History  ?Problem Relation Age of Onset  ? Cancer Mother 62  ?     colon, rectal  ? Diabetes Father   ? Uterine cancer Maternal Aunt   ? Bladder Cancer Maternal Grandmother   ? Bladder Cancer Maternal Grandfather   ? ? ?Social History  ? ?Socioeconomic History  ? Marital status: Divorced  ?  Spouse name: Not on file  ? Number of children: Not on file  ? Years of education: Not on file  ? Highest education level: Not on file  ?Occupational History  ? Not on file  ?Tobacco Use  ? Smoking status: Former  ?  Packs/day: 0.50  ?  Years: 29.00  ?  Pack years: 14.50  ?  Types: Cigarettes  ?  Quit date: 01/09/2013  ?  Years since quitting: 8.1  ? Smokeless tobacco: Never  ?Vaping Use  ? Vaping Use: Never used  ?Substance and Sexual Activity  ? Alcohol use: Yes  ?  Comment: occasional  ? Drug use: No  ?  Sexual activity: Yes  ?  Birth control/protection: Surgical  ?Other Topics Concern  ? Not on file  ?Social History Narrative  ? Not on file  ? ?Social Determinants of Health  ? ?Financial Resource Strain: Not on file  ?Food Insecurity: Not on file  ?Transportation Needs: Not on file  ?Physical Activity: Not on file  ?Stress: Not on file  ?Social Connections: Not on file  ?Intimate Partner Violence: Not on file  ? ? ?Review of Systems: ?See HPI, otherwise negative ROS ? ?Physical Exam: ?BP 115/84   Pulse (!) 54   Temp (!) 97.2 ?F (36.2 ?C) (Temporal)   Resp 16   Ht 5\' 8"  (1.727 m)   Wt 81.6 kg   LMP 03/10/2021 (Exact Date)   SpO2 99%   BMI 27.37 kg/m?  ?General:   Alert,  pleasant and cooperative in NAD ?Head:  Normocephalic and atraumatic. ?Neck:  Supple; no masses or thyromegaly. ?Lungs:  Clear throughout to auscultation.    ?Heart:   Regular rate and rhythm. ?Abdomen:  Soft, nontender and nondistended. Normal bowel sounds, without guarding, and without rebound.   ?Neurologic:  Alert and  oriented x4;  grossly normal neurologically. ? ?Impression/Plan: ?[name] is here for an endoscopy to be performed for epigastric pain ? ?Risks, benefits, limitations, and alternatives regarding  endoscopy have been reviewed with the patient.  Questions have been answered.  All parties agreeable. ? ? ?Heidi Blamer, MD  03/10/2021, 9:08 AM ?

## 2021-03-10 NOTE — Op Note (Signed)
[facility] ?Gastroenterology ?Patient Name: Heidi [name] ?Procedure Date: 03/10/2021 9:18 AM ?MRN: [id number] ?Account #: [id number] ?Date of Birth: 09/23/1975 ?Admit Type: Outpatient ?Age: 46 ?Room: [facility] ENDO ROOM 4 ?Gender: Female ?Note Status: Finalized ?Instrument Name: Upper Endoscope [id number] ?Procedure:             Upper GI endoscopy ?Indications:           Epigastric abdominal pain ?Providers:             Toney Reil MD, MD ?Referring MD:          Demetrios Isaacs. Sherrie Mustache, MD (Referring MD) ?Medicines:             General Anesthesia ?Complications:         No immediate complications. Estimated blood loss: None. ?Procedure:             Pre-Anesthesia Assessment: ?                       - Prior to the procedure, a History and Physical was  ?                       performed, and patient medications and allergies were  ?                       reviewed. The patient is competent. The risks and  ?                       benefits of the procedure and the sedation options and  ?                       risks were discussed with the patient. All questions  ?                       were answered and informed consent was obtained.  ?                       Patient identification and proposed procedure were  ?                       verified by the physician, the nurse, the  ?                       anesthesiologist, the anesthetist and the technician  ?                       in the pre-procedure area in the procedure room in the  ?                       endoscopy suite. Mental Status Examination: alert and  ?                       oriented. Airway Examination: normal oropharyngeal  ?                       airway and neck mobility. Respiratory Examination:  ?                       clear to auscultation. CV Examination: normal.  ?  Prophylactic Antibiotics: The patient does not require  ?                       prophylactic antibiotics. Prior Anticoagulants: The  ?                        patient has taken no previous anticoagulant or  ?                       antiplatelet agents. ASA Grade Assessment: II - A  ?                       patient with mild systemic disease. After reviewing  ?                       the risks and benefits, the patient was deemed in  ?                       satisfactory condition to undergo the procedure. The  ?                       anesthesia plan was to use general anesthesia.  ?                       Immediately prior to administration of medications,  ?                       the patient was re-assessed for adequacy to receive  ?                       sedatives. The heart rate, respiratory rate, oxygen  ?                       saturations, blood pressure, adequacy of pulmonary  ?                       ventilation, and response to care were monitored  ?                       throughout the procedure. The physical status of the  ?                       patient was re-assessed after the procedure. ?                       After obtaining informed consent, the endoscope was  ?                       passed under direct vision. Throughout the procedure,  ?                       the patient's blood pressure, pulse, and oxygen  ?                       saturations were monitored continuously. The Endoscope  ?                       was introduced through the mouth, and advanced to the  ?  second part of duodenum. The upper GI endoscopy was  ?                       accomplished without difficulty. The patient tolerated  ?                       the procedure well. ?Findings: ?     The esophagus, gastroesophageal junction and examined esophagus were  ?     normal. ?     The stomach was normal. Biopsies were taken with a cold forceps for  ?     histology. ?     The examined duodenum was normal. ?     The cardia and gastric fundus were normal on retroflexion. ?     Esophagogastric landmarks were identified: the gastroesophageal junction  ?     was found at 40 cm from  the incisors. ?Impression:            - Normal esophagus and gastroesophageal junction. ?                       - Normal stomach. Biopsied. ?                       - Normal examined duodenum. ?                       - Esophagogastric landmarks identified. ?Recommendation:        - Await pathology results. ?                       - Discharge patient to home (with escort). ?                       - Resume previous diet today. ?                       - Continue present medications. ?                       - Await pathology results. ?Procedure Code(s):     --- Professional --- ?                       [phone], Esophagogastroduodenoscopy, flexible,  ?                       transoral; with biopsy, single or multiple ?Diagnosis Code(s):     --- Professional --- ?                       R10.13, Epigastric pain ?CPT copyright 2019 [employer] Medical Association. All rights reserved. ?The codes documented in this report are preliminary and upon coder review may  ?be revised to meet current compliance requirements. ?Dr. Libby Maw ?Kyden Potash Alger Memos MD, MD ?03/10/2021 9:31:51 AM ?This report has been signed electronically. ?Number of Addenda: 0 ?Note Initiated On: 03/10/2021 9:18 AM ?Estimated Blood Loss:  Estimated blood loss: none. ?     [facility] ?

## 2021-03-10 NOTE — Transfer of Care (Signed)
Immediate Anesthesia Transfer of Care Note ? ?Patient: Heidi [name] ? ?Procedure(s) Performed: ESOPHAGOGASTRODUODENOSCOPY (EGD) WITH PROPOFOL ? ?Patient Location: PACU and Endoscopy Unit ? ?Anesthesia Type:MAC ? ?Level of Consciousness: drowsy ? ?Airway & Oxygen Therapy: Patient Spontanous Breathing ? ?Post-op Assessment: Report given to RN and Post -op Vital signs reviewed and stable ? ?Post vital signs: Reviewed and stable ? ?Last Vitals:  ?Vitals Value Taken Time  ?BP 99/68 03/10/21 0936  ?Temp 36.2 ?C 03/10/21 0932  ?Pulse 60 03/10/21 0938  ?Resp 11 03/10/21 0938  ?SpO2 97 % 03/10/21 0938  ?Vitals shown include unvalidated device data. ? ?Last Pain:  ?Vitals:  ? 03/10/21 0932  ?TempSrc: Temporal  ?PainSc:   ?   ? ?  ? ?Complications: No notable events documented. ?

## 2021-03-10 NOTE — Anesthesia Preprocedure Evaluation (Signed)
Anesthesia Evaluation  ?Patient identified by MRN, date of birth, ID band ?Patient awake ? ? ? ?Reviewed: ?Allergy & Precautions, NPO status , Patient's Chart, lab work & pertinent test results ? ?Airway ?Mallampati: II ? ?TM Distance: >3 FB ?Neck ROM: full ? ? ? Dental ? ?(+) Teeth Intact ?  ?Pulmonary ?neg pulmonary ROS, former smoker,  ?  ?Pulmonary exam normal ?breath sounds clear to auscultation ? ? ? ? ? ? Cardiovascular ?Exercise Tolerance: Good ?negative cardio ROS ?Normal cardiovascular exam ?Rhythm:Regular Rate:Normal ? ? ?  ?Neuro/Psych ?Anxiety Depression negative neurological ROS ? negative psych ROS  ? GI/Hepatic ?negative GI ROS, Neg liver ROS,   ?Endo/Other  ?negative endocrine ROS ? Renal/GU ?negative Renal ROS  ?negative genitourinary ?  ?Musculoskeletal ?negative musculoskeletal ROS ?(+)  ? Abdominal ?Normal abdominal exam  (+)   ?Peds ?negative pediatric ROS ?(+)  Hematology ?negative hematology ROS ?(+)   ?Anesthesia Other Findings ?Past Medical History: ?No date: Anxiety ?No date: Asthma ?No date: Clotting disorder ([place]) ?    Comment:  DVT ?No date: Depression ?No date: DVT (deep venous thrombosis) ([place]) ?No date: Family history of colon cancer in mother ?No date: Family history of ovarian cancer ?    Comment:  4/21 cancer genetic tesitng letter sent ?No date: Herpes genitalis ?No date: Lupus ([place]) ? ?Past Surgical History: ?No date: ABLATION ?2011: COLONOSCOPY ?No date: ENDOMETRIAL BIOPSY ?No date: NASAL SEPTUM SURGERY ?1995: NOSE SURGERY ?    Comment:  deviated nose septum ?1995: TONSILLECTOMY ?2009: TUBAL LIGATION ? ?BMI   ? Body Mass Index: 27.37 kg/m?  ?  ? ? Reproductive/Obstetrics ?negative OB ROS ? ?  ? ? ? ? ? ? ? ? ? ? ? ? ? ?  ?  ? ? ? ? ? ? ? ? ?Anesthesia Physical ?Anesthesia Plan ? ?ASA: 2 ? ?Anesthesia Plan: General  ? ?Post-op Pain Management:   ? ?Induction: Intravenous ? ?PONV Risk Score and Plan: Propofol infusion and TIVA ? ?Airway  Management Planned: Natural Airway and Nasal Cannula ? ?Additional Equipment:  ? ?Intra-op Plan:  ? ?Post-operative Plan:  ? ?Informed Consent: I have reviewed the patients History and Physical, chart, labs and discussed the procedure including the risks, benefits and alternatives for the proposed anesthesia with the patient or authorized representative who has indicated his/her understanding and acceptance.  ? ? ? ?Dental Advisory Given ? ?Plan Discussed with: CRNA and Surgeon ? ?Anesthesia Plan Comments:   ? ? ? ? ? ? ?Anesthesia Quick Evaluation ? ?

## 2021-03-11 ENCOUNTER — Encounter: Payer: Self-pay | Admitting: Gastroenterology

## 2021-03-11 ENCOUNTER — Ambulatory Visit: Payer: BC Managed Care – PPO | Admitting: Psychology

## 2021-03-11 DIAGNOSIS — F4321 Adjustment disorder with depressed mood: Secondary | ICD-10-CM | POA: Diagnosis not present

## 2021-03-11 DIAGNOSIS — Z634 Disappearance and death of family member: Secondary | ICD-10-CM | POA: Diagnosis not present

## 2021-03-11 DIAGNOSIS — F4310 Post-traumatic stress disorder, unspecified: Secondary | ICD-10-CM

## 2021-03-11 LAB — SURGICAL PATHOLOGY

## 2021-03-11 NOTE — Progress Notes (Signed)
[facility] Counselor/Therapist Progress Note ? ?Patient ID: Heidi [name], MRN: [id number],   ? ?Date: 03/11/2021 ? ?Time Spent: 60 minutes ? ?Treatment Type: Individual Therapy ? ?Reported Symptoms: sadness, irritability,  ? ?Mental Status Exam: ?Appearance:  Casual     ?Behavior: Appropriate  ?Motor: Normal  ?Speech/Language:  Normal Rate  ?Affect: Depressed  ?Mood: depressed  ?Thought process: normal  ?Thought content:   WNL  ?Sensory/Perceptual disturbances:   WNL  ?Orientation: oriented to person, place, time/date, and situation  ?Attention: Good  ?Concentration: Good  ?Memory: WNL  ?Fund of knowledge:  Good  ?Insight:   Good  ?Judgment:  Good  ?Impulse Control: Good  ? ?Risk Assessment: ?Danger to Self:  No ?Self-injurious Behavior: No ?Danger to Others: No ?Duty to Warn:no ?Physical Aggression / Violence:No  ?Access to Firearms a concern: No  ?Gang Involvement:No  ? ?Subjective: The patient attended a face to face individual therapy session in the office today.  The patient presents as frustrated today.  She reports that she had an endoscopy yesterday and the results were normal.  She was frustrated by this because she had to spend money to have a test done that she did not feel like was helpful to her.  We tried to reframe it and she seemed to get a little better after we talked about it a bit.  The patient talked about her current relationship with her boyfriend and processed some of her concerns about their relationship and what she would like to happen.  They are doing well and he says that he is planning to ask her to marry him however he has not done so yet and they have been dating 8 years.  Provided supportive therapy and cognitive behavioral therapy. ? ?Interventions: Grief Therapy, Insight Oriented and CBT ? ?Diagnosis:PTSD (post-traumatic stress disorder) ? ?Grief at loss of child ? ?Plan: Please see plan in Therapy charts with a target date of 09/23/2021.  The patient approved this  plan and is currently maintaining progress.  Patient approved this treatment plan. ? ?Shericka Johnstone G Baylie Drakes, LCSW ? ? ? ? ? ? ? ? ? ? ? ? ? ? ? ? ? ?Algernon Mundie G Delsie Amador, LCSW ? ? ? ? ? ? ? ? ? ? ? ? ? ? ?Lindey Renzulli G Shyonna Carlin, LCSW ? ? ? ? ? ? ? ? ? ? ? ? ? ? ?Jessie Cowher G Yaminah Clayborn, LCSW ? ? ? ? ? ? ? ? ? ? ? ? ? ? ?Fredrick Dray G Dyasia Firestine, LCSW ? ? ? ? ? ? ? ? ? ? ? ? ? ? ?Yareliz Thorstenson G Ovila Lepage, LCSW ? ? ? ? ? ? ? ? ? ? ? ? ? ? ?Dezarai Prew G Olive Zmuda, LCSW ? ? ? ? ? ? ? ? ? ? ? ? ? ? ?Denna Fryberger G Winslow Ederer, LCSW ? ? ? ? ? ? ? ? ? ? ? ? ? ? ?Shatara Stanek G Sharin Altidor, LCSW ?

## 2021-03-18 ENCOUNTER — Ambulatory Visit: Payer: BC Managed Care – PPO | Admitting: Psychology

## 2021-03-18 ENCOUNTER — Other Ambulatory Visit: Payer: Self-pay

## 2021-03-18 DIAGNOSIS — F4321 Adjustment disorder with depressed mood: Secondary | ICD-10-CM

## 2021-03-18 DIAGNOSIS — F4310 Post-traumatic stress disorder, unspecified: Secondary | ICD-10-CM

## 2021-03-18 DIAGNOSIS — Z634 Disappearance and death of family member: Secondary | ICD-10-CM

## 2021-03-18 NOTE — Progress Notes (Signed)
[facility] Counselor/Therapist Progress Note ? ?Patient ID: Heidi [name], MRN: [id number],   ? ?Date: 03/18/2021 ? ?Time Spent: 60 minutes ? ?Treatment Type: Individual Therapy ? ?Reported Symptoms: sadness, irritability,  ? ?Mental Status Exam: ?Appearance:  Casual     ?Behavior: Appropriate  ?Motor: Normal  ?Speech/Language:  Normal Rate  ?Affect: Depressed  ?Mood: depressed  ?Thought process: normal  ?Thought content:   WNL  ?Sensory/Perceptual disturbances:   WNL  ?Orientation: oriented to person, place, time/date, and situation  ?Attention: Good  ?Concentration: Good  ?Memory: WNL  ?Fund of knowledge:  Good  ?Insight:   Good  ?Judgment:  Good  ?Impulse Control: Good  ? ?Risk Assessment: ?Danger to Self:  No ?Self-injurious Behavior: No ?Danger to Others: No ?Duty to Warn:no ?Physical Aggression / Violence:No  ?Access to Firearms a concern: No  ?Gang Involvement:No  ? ?Subjective: The patient attended a face to face individual therapy session in the office today.  The patient presents as pleasant and cooperative today.  The patient was smiling more.  The patient states that she and her boyfriend talked about what we had discussed in the last session and they seem to be in a better place about it.  She is doing an excellent job of communicating with him and talking about her feelings.  She states that she had a difficult day on Sunday because she had some thoughts about missing her son.  She reports that she has not had any grief issues this week other than on Sunday.  We did some reframing and some supportive therapy today and talked about her concerns about work. ?Interventions: Grief Therapy, Insight Oriented and CBT ? ?Diagnosis:PTSD (post-traumatic stress disorder) ? ?Grief at loss of child ? ?Plan: Please see plan in Therapy charts with a target date of 09/23/2021.  The patient approved this plan and is currently maintaining progress.  Patient approved this treatment plan. ? ?Nicandro Perrault G  Mileena Rothenberger, LCSW ? ? ? ? ? ? ? ? ? ? ? ? ? ? ? ? ? ?Larri Yehle G Tremaine Earwood, LCSW ? ? ? ? ? ? ? ? ? ? ? ? ? ? ?Beckhem Isadore G Aram Domzalski, LCSW ? ? ? ? ? ? ? ? ? ? ? ? ? ? ?Ethel Veronica G Chasidy Janak, LCSW ? ? ? ? ? ? ? ? ? ? ? ? ? ? ?Caryssa Elzey G Lateasha Breuer, LCSW ? ? ? ? ? ? ? ? ? ? ? ? ? ? ?Hartman Minahan G Eulogia Dismore, LCSW ? ? ? ? ? ? ? ? ? ? ? ? ? ? ?Ameli Sangiovanni G Jesper Stirewalt, LCSW ? ? ? ? ? ? ? ? ? ? ? ? ? ? ?Toneisha Savary G Karlyn Glasco, LCSW ? ? ? ? ? ? ? ? ? ? ? ? ? ? ?Aron Needles G Sandie Swayze, LCSW ? ? ? ? ? ? ? ? ? ? ? ? ? ? ?Roshan Salamon G Beulah Matusek, LCSW ?

## 2021-03-25 ENCOUNTER — Other Ambulatory Visit: Payer: Self-pay

## 2021-03-25 ENCOUNTER — Encounter: Payer: Self-pay | Admitting: Gastroenterology

## 2021-03-25 ENCOUNTER — Ambulatory Visit: Payer: BC Managed Care – PPO | Admitting: Psychology

## 2021-03-25 DIAGNOSIS — F4321 Adjustment disorder with depressed mood: Secondary | ICD-10-CM | POA: Diagnosis not present

## 2021-03-25 DIAGNOSIS — F4310 Post-traumatic stress disorder, unspecified: Secondary | ICD-10-CM | POA: Diagnosis not present

## 2021-03-25 DIAGNOSIS — Z634 Disappearance and death of family member: Secondary | ICD-10-CM

## 2021-03-25 NOTE — Progress Notes (Signed)
[facility] Counselor/Therapist Progress Note ? ?Patient ID: Heidi [name], MRN: [id number],   ? ?Date: 03/25/2021 ? ?Time Spent: 60 minutes ? ?Treatment Type: Individual Therapy ? ?Reported Symptoms: sadness, irritability,  ? ?Mental Status Exam: ?Appearance:  Casual     ?Behavior: Appropriate  ?Motor: Normal  ?Speech/Language:  Normal Rate  ?Affect: Depressed  ?Mood: depressed  ?Thought process: normal  ?Thought content:   WNL  ?Sensory/Perceptual disturbances:   WNL  ?Orientation: oriented to person, place, time/date, and situation  ?Attention: Good  ?Concentration: Good  ?Memory: WNL  ?Fund of knowledge:  Good  ?Insight:   Good  ?Judgment:  Good  ?Impulse Control: Good  ? ?Risk Assessment: ?Danger to Self:  No ?Self-injurious Behavior: No ?Danger to Others: No ?Duty to Warn:no ?Physical Aggression / Violence:No  ?Access to Firearms a concern: No  ?Gang Involvement:No  ? ?Subjective: The patient attended a face to face individual therapy session in the office today.  The patient presents as pleasant and cooperative today.  The patient presents with a blunted affect and mood is  pleasant.  The patient reports that today is the anniversary date of her son's death.  The patient talked about her grief process and continues to have her moments of sadness and she states that she moves through it and seems to be doing okay with her grief.  The patient talked about wanting to go back to school and has done some research and to possibly going back to school for social work.  In addition the patient also talked about her ex-husband and how he is grieving as well.  The patient states that she knows she does not want to get to acceptance yet and we talked about this being a normal process for her to be hesitant about doing that. ? ?Interventions: Grief Therapy, Insight Oriented and CBT ? ?Diagnosis:PTSD (post-traumatic stress disorder) ? ?Grief at loss of child ? ?Plan: Please see plan in Therapy charts with  a target date of 09/23/2021.  The patient approved this plan and is currently maintaining progress.  Patient approved this treatment plan. ? ?Mayola Mcbain G Geovanny Sartin, LCSW ? ? ? ? ? ? ? ? ? ? ? ? ? ? ? ? ? ?Abraham Entwistle G Wania Longstreth, LCSW ? ? ? ? ? ? ? ? ? ? ? ? ? ? ?Aveleen Nevers G Iann Rodier, LCSW ? ? ? ? ? ? ? ? ? ? ? ? ? ? ?Joesphine Schemm G Arlyn Buerkle, LCSW ? ? ? ? ? ? ? ? ? ? ? ? ? ? ?Kamaree Wheatley G Miron Marxen, LCSW ? ? ? ? ? ? ? ? ? ? ? ? ? ? ?Payton Moder G Zadyn Yardley, LCSW ? ? ? ? ? ? ? ? ? ? ? ? ? ? ?Khalidah Herbold G Bryanne Riquelme, LCSW ? ? ? ? ? ? ? ? ? ? ? ? ? ? ?Tahmir Kleckner G Clarisse Rodriges, LCSW ? ? ? ? ? ? ? ? ? ? ? ? ? ? ?Tashima Scarpulla G Nancey Kreitz, LCSW ? ? ? ? ? ? ? ? ? ? ? ? ? ? ?Calix Heinbaugh G Teshia Mahone, LCSW ? ? ? ? ? ? ? ? ? ? ? ? ? ? ?Tobe Kervin G Mulki Roesler, LCSW ?

## 2021-04-01 ENCOUNTER — Other Ambulatory Visit: Payer: Self-pay

## 2021-04-01 ENCOUNTER — Ambulatory Visit: Payer: BC Managed Care – PPO | Admitting: Psychology

## 2021-04-01 DIAGNOSIS — F4310 Post-traumatic stress disorder, unspecified: Secondary | ICD-10-CM | POA: Diagnosis not present

## 2021-04-01 DIAGNOSIS — F4321 Adjustment disorder with depressed mood: Secondary | ICD-10-CM | POA: Diagnosis not present

## 2021-04-01 DIAGNOSIS — Z634 Disappearance and death of family member: Secondary | ICD-10-CM | POA: Diagnosis not present

## 2021-04-02 NOTE — Progress Notes (Signed)
[facility] Counselor/Therapist Progress Note ? ?Patient ID: Heidi [name], MRN: [id number],   ? ?Date: 04/01/2021 ? ?Time Spent: 60 minutes ? ?Treatment Type: Individual Therapy ? ?Reported Symptoms: sadness, irritability,  ? ?Mental Status Exam: ?Appearance:  Casual     ?Behavior: Appropriate  ?Motor: Normal  ?Speech/Language:  Normal Rate  ?Affect: Depressed  ?Mood: depressed  ?Thought process: normal  ?Thought content:   WNL  ?Sensory/Perceptual disturbances:   WNL  ?Orientation: oriented to person, place, time/date, and situation  ?Attention: Good  ?Concentration: Good  ?Memory: WNL  ?Fund of knowledge:  Good  ?Insight:   Good  ?Judgment:  Good  ?Impulse Control: Good  ? ?Risk Assessment: ?Danger to Self:  No ?Self-injurious Behavior: No ?Danger to Others: No ?Duty to Warn:no ?Physical Aggression / Violence:No  ?Access to Firearms a concern: No  ?Gang Involvement:No  ? ?Subjective: The patient attended a face to face individual therapy session in the office today.  The patient presents with a blunted affect and mood is pleasant.  She states that she has had more irritability over the last day.  Reports that at she got the police report of her son's suicide yesterday.  This likely had something to do with her irritability today.  The patient talked about her relationship with her boyfriend and he is likely to propose this weekend.  She seems a little anxious about it, but feels happy that this is going to happen soon.  We talked about her job search as well today and discussed the possibilities for schooling so that she can find something that she likes better.  The patient is doing well and managing her grief and applying coping strategies discussed. ? ? ?Interventions: Grief Therapy, Insight Oriented and CBT ? ?Diagnosis:PTSD (post-traumatic stress disorder) ? ?Grief at loss of child ? ?Plan: Please see plan in Therapy charts with a target date of 09/23/2021.  The patient approved this plan  and is currently maintaining progress.  Patient approved this treatment plan. ? ?Gabrella Stroh G Toivo Bordon, LCSW ? ? ? ? ? ? ? ? ? ? ? ? ? ? ? ? ? ?Kohei Antonellis G Jaydin Jalomo, LCSW ? ? ? ? ? ? ? ? ? ? ? ? ? ? ?Dejour Vos G Aaliyah Cancro, LCSW ? ? ? ? ? ? ? ? ? ? ? ? ? ? ?Kaleigh Spiegelman G Nadelyn Enriques, LCSW ? ? ? ? ? ? ? ? ? ? ? ? ? ? ?Giann Obara G Gyanna Jarema, LCSW ? ? ? ? ? ? ? ? ? ? ? ? ? ? ?Nuria Phebus G Tya Haughey, LCSW ? ? ? ? ? ? ? ? ? ? ? ? ? ? ?Momodou Consiglio G Hanaan Gancarz, LCSW ? ? ? ? ? ? ? ? ? ? ? ? ? ? ?Pookela Sellin G Shawnta Schlegel, LCSW ? ? ? ? ? ? ? ? ? ? ? ? ? ? ?Grizel Vesely G Albie Arizpe, LCSW ? ? ? ? ? ? ? ? ? ? ? ? ? ? ?Kalisha Keadle G Avan Gullett, LCSW ? ? ? ? ? ? ? ? ? ? ? ? ? ? ?Journee Bobrowski G Tawn Fitzner, LCSW ? ? ? ? ? ? ? ? ? ? ? ? ? ? ?Annalisia Ingber G Velora Horstman, LCSW ?

## 2021-04-08 ENCOUNTER — Ambulatory Visit: Payer: BC Managed Care – PPO | Admitting: Psychology

## 2021-04-08 DIAGNOSIS — Z634 Disappearance and death of family member: Secondary | ICD-10-CM | POA: Diagnosis not present

## 2021-04-08 DIAGNOSIS — F4321 Adjustment disorder with depressed mood: Secondary | ICD-10-CM | POA: Diagnosis not present

## 2021-04-08 DIAGNOSIS — F4310 Post-traumatic stress disorder, unspecified: Secondary | ICD-10-CM

## 2021-04-08 NOTE — Progress Notes (Signed)
[facility] Counselor/Therapist Progress Note ? ?Patient ID: Heidi [name], MRN: [id number],   ? ?Date: 04/08/2021 ? ?Time Spent: 60 minutes ? ?Treatment Type: Individual Therapy ? ?Reported Symptoms: sadness, irritability,  ? ?Mental Status Exam: ?Appearance:  Casual     ?Behavior: Appropriate  ?Motor: Normal  ?Speech/Language:  Normal Rate  ?Affect: Depressed  ?Mood: depressed  ?Thought process: normal  ?Thought content:   WNL  ?Sensory/Perceptual disturbances:   WNL  ?Orientation: oriented to person, place, time/date, and situation  ?Attention: Good  ?Concentration: Good  ?Memory: WNL  ?Fund of knowledge:  Good  ?Insight:   Good  ?Judgment:  Good  ?Impulse Control: Good  ? ?Risk Assessment: ?Danger to Self:  No ?Self-injurious Behavior: No ?Danger to Others: No ?Duty to Warn:no ?Physical Aggression / Violence:No  ?Access to Firearms a concern: No  ?Gang Involvement:No  ? ?Subjective: The patient attended a face to face individual therapy session in the office today.  The patient presents with a blunted affect and mood is pleasant.  The patient reports that she got engaged over the weekend and seems very happy about that.  She did talk today about a situation that happened over the weekend that hurt a little bit of her grandfather on the engagement.  She states that the police came and told her that they were going to have to take down some of the memorial that was set up where her son passed away.  The patient talked about being saddened by this and angry about this.  She is going to meet with several of the students who go to the [employer] on a regular basis and have a conversation with them about having to take some of the items down that are bigger to honor her son.  She sounds like she has the situation under control and has a good message to give to the children that are coming to the meeting. ? ?Interventions: Grief Therapy, Insight Oriented and CBT ? ?Diagnosis:PTSD (post-traumatic stress  disorder) ? ?Grief at loss of child ? ?Plan: Please see plan in Therapy charts with a target date of 09/23/2021.  The patient approved this plan and is currently maintaining progress.  Patient approved this treatment plan. ? ?Heidi Furuta G Jermaine Tholl, LCSW ? ? ? ? ? ? ? ? ? ? ? ? ? ? ? ? ? ?Heidi Decarolis G Jarad Barth, LCSW ? ? ? ? ? ? ? ? ? ? ? ? ? ? ?Heidi Formanek G Cliford Sequeira, LCSW ? ? ? ? ? ? ? ? ? ? ? ? ? ? ?Heidi Perret G Kesley Gaffey, LCSW ? ? ? ? ? ? ? ? ? ? ? ? ? ? ?Heidi Bilek G Emelia Sandoval, LCSW ? ? ? ? ? ? ? ? ? ? ? ? ? ? ?Heidi Armbrister G Marlina Cataldi, LCSW ? ? ? ? ? ? ? ? ? ? ? ? ? ? ?Heidi Crisco G Chayanne Filippi, LCSW ? ? ? ? ? ? ? ? ? ? ? ? ? ? ?Heidi Kampf G Zoie Sarin, LCSW ? ? ? ? ? ? ? ? ? ? ? ? ? ? ?Heidi Rehberg G Lenwood Balsam, LCSW ? ? ? ? ? ? ? ? ? ? ? ? ? ? ?Inetta Dicke G Philomena Buttermore, LCSW ? ? ? ? ? ? ? ? ? ? ? ? ? ? ?Jacklyne Baik G Leila Schuff, LCSW ? ? ? ? ? ? ? ? ? ? ? ? ? ? ?Shantel Helwig G Dezaria Methot, LCSW ? ? ? ? ? ? ? ? ? ? ? ? ? ? ?  Jisele Price G Davit Vassar, LCSW ?

## 2021-04-09 ENCOUNTER — Other Ambulatory Visit: Payer: Self-pay | Admitting: *Deleted

## 2021-04-09 ENCOUNTER — Encounter: Payer: Self-pay | Admitting: Family Medicine

## 2021-04-09 DIAGNOSIS — G2581 Restless legs syndrome: Secondary | ICD-10-CM

## 2021-04-09 MED ORDER — GABAPENTIN 300 MG PO CAPS: 300.0000 mg | ORAL_CAPSULE | Freq: Every day | ORAL | 3 refills | Status: DC

## 2021-04-15 ENCOUNTER — Ambulatory Visit: Payer: BC Managed Care – PPO | Admitting: Psychology

## 2021-04-15 DIAGNOSIS — F4310 Post-traumatic stress disorder, unspecified: Secondary | ICD-10-CM | POA: Diagnosis not present

## 2021-04-15 DIAGNOSIS — Z634 Disappearance and death of family member: Secondary | ICD-10-CM | POA: Diagnosis not present

## 2021-04-15 DIAGNOSIS — F4321 Adjustment disorder with depressed mood: Secondary | ICD-10-CM | POA: Diagnosis not present

## 2021-04-15 NOTE — Progress Notes (Signed)
Behavioral Health Counselor/Therapist Progress Note ? ?Patient ID: Heidi [name], MRN: [id number],   ? ?Date: 04/15/2021 ? ?Time Spent: 60 minutes ? ?Treatment Type: Individual Therapy ? ?Reported Symptoms: sadness, irritability,  ? ?Mental Status Exam: ?Appearance:  Casual     ?Behavior: Appropriate  ?Motor: Normal  ?Speech/Language:  Normal Rate  ?Affect: Depressed  ?Mood: depressed  ?Thought process: normal  ?Thought content:   WNL  ?Sensory/Perceptual disturbances:   WNL  ?Orientation: oriented to person, place, time/date, and situation  ?Attention: Good  ?Concentration: Good  ?Memory: WNL  ?Fund of knowledge:  Good  ?Insight:   Good  ?Judgment:  Good  ?Impulse Control: Good  ? ?Risk Assessment: ?Danger to Self:  No ?Self-injurious Behavior: No ?Danger to Others: No ?Duty to Warn:no ?Physical Aggression / Violence:No  ?Access to Firearms a concern: No  ?Gang Involvement:No  ? ?Subjective: The patient attended a face to face individual therapy session in the office today.  The patient presents with a blunted affect and mood is pleasant.  The patient reports that she feels more depressed now because she had to go back to [facility] on Friday of last week.  She states that she thinks that she probably is in that depression stage of grief now and that she probably is in a normal place for her to be.  We talked about how the situation went last Friday with taking his [employer] down in it seems to have stirred her up a little bit in regards to her grief.  We talked about these feelings being very normal and I validated for her that these are normal feelings.  The patient is still considering looking for another job and this is weighing heavily on her mind.  We will continue to help patient process looking for another job and dealing with the loss of her son as well as the other life events that are going on with her. ? ?Interventions: Grief Therapy, Insight Oriented and CBT ? ?Diagnosis:PTSD  (post-traumatic stress disorder) ? ?Grief at loss of child ? ?Plan: Please see plan in Therapy charts with a target date of 09/23/2021.  The patient approved this plan and is currently maintaining progress.  Patient approved this treatment plan. ? ?Genevra Orne G Ilyssa Grennan, LCSW ? ? ? ? ? ? ? ? ? ? ? ? ? ? ? ? ? ?Jos Cygan G Raheim Beutler, LCSW ? ? ? ? ? ? ? ? ? ? ? ? ? ? ?Taziah Difatta G Maliki Gignac, LCSW ? ? ? ? ? ? ? ? ? ? ? ? ? ? ?Tanai Bouler G Padraic Marinos, LCSW ? ? ? ? ? ? ? ? ? ? ? ? ? ? ?Zaid Tomes G Savonna Birchmeier, LCSW ? ? ? ? ? ? ? ? ? ? ? ? ? ? ?Dannell Raczkowski G Emiya Loomer, LCSW ? ? ? ? ? ? ? ? ? ? ? ? ? ? ?Alexius Ellington G Alajia Schmelzer, LCSW ? ? ? ? ? ? ? ? ? ? ? ? ? ? ?Errica Dutil G Shelvie Salsberry, LCSW ? ? ? ? ? ? ? ? ? ? ? ? ? ? ?Sajan Cheatwood G Onesty Clair, LCSW ? ? ? ? ? ? ? ? ? ? ? ? ? ? ?Luetta Piazza G Hadlea Furuya, LCSW ? ? ? ? ? ? ? ? ? ? ? ? ? ? ?Morenike Cuff G Phala Schraeder, LCSW ? ? ? ? ? ? ? ? ? ? ? ? ? ? ?Yohannes Waibel G Shanise Balch, LCSW ? ? ? ? ? ? ? ? ? ? ? ? ? ? ?  Zafir Schauer G Jalayne Ganesh, LCSW ? ? ? ? ? ? ? ? ? ? ? ? ? ? ?Gilberto Stanforth G Zorawar Strollo, LCSW ?

## 2021-04-22 ENCOUNTER — Ambulatory Visit: Payer: BC Managed Care – PPO | Admitting: Psychology

## 2021-04-22 DIAGNOSIS — Z634 Disappearance and death of family member: Secondary | ICD-10-CM | POA: Diagnosis not present

## 2021-04-22 DIAGNOSIS — F4321 Adjustment disorder with depressed mood: Secondary | ICD-10-CM

## 2021-04-22 DIAGNOSIS — F4310 Post-traumatic stress disorder, unspecified: Secondary | ICD-10-CM | POA: Diagnosis not present

## 2021-04-22 NOTE — Progress Notes (Signed)
[facility] Counselor/Therapist Progress Note ? ?Patient ID: Heidi [name], MRN: [id number],   ? ?Date:  04/22/2021 ? ?Time Spent: 60 minutes ? ?Treatment Type: Individual Therapy ? ?Reported Symptoms: sadness, irritability,  ? ?Mental Status Exam: ?Appearance:  Casual     ?Behavior: Appropriate  ?Motor: Normal  ?Speech/Language:  Normal Rate  ?Affect: Depressed  ?Mood: depressed  ?Thought process: normal  ?Thought content:   WNL  ?Sensory/Perceptual disturbances:   WNL  ?Orientation: oriented to person, place, time/date, and situation  ?Attention: Good  ?Concentration: Good  ?Memory: WNL  ?Fund of knowledge:  Good  ?Insight:   Good  ?Judgment:  Good  ?Impulse Control: Good  ? ?Risk Assessment: ?Danger to Self:  No ?Self-injurious Behavior: No ?Danger to Others: No ?Duty to Warn:no ?Physical Aggression / Violence:No  ?Access to Firearms a concern: No  ?Gang Involvement:No  ? ?Subjective: The patient attended a face to face individual therapy session in the office today.  The patient presents as pleasant and cooperative.  The patient was smiling more today and seems to be handling the loss of her son much better than she has in the past.  The patient reports that she feels good about moving through her grief and feels like she is trying to be helpful to her ex-husband because he is struggling more than she is.  The patient seems to be coping well at this point in time .  We will continue to provide supportive therapy and grief therapy as indicated. ? ? ?Interventions: Grief Therapy, Insight Oriented and CBT ? ?Diagnosis:PTSD (post-traumatic stress disorder) ? ?Grief at loss of child ? ?Plan: Please see plan in Therapy charts with a target date of 09/23/2021.  The patient approved this plan and is currently maintaining progress.  Patient approved this treatment plan. ? ?Edwina Grossberg G Rahshawn Remo, LCSW ? ? ? ? ? ? ? ? ? ? ? ? ? ? ? ? ? ?Paislie Tessler G Jaloni Sorber, LCSW ? ? ? ? ? ? ? ? ? ? ? ? ? ? ?Yussef Jorge G Javae Braaten,  LCSW ? ? ? ? ? ? ? ? ? ? ? ? ? ? ?Edilson Vital G Avyn Aden, LCSW ? ? ? ? ? ? ? ? ? ? ? ? ? ? ?Alanis Clift G Eh Sauseda, LCSW ? ? ? ? ? ? ? ? ? ? ? ? ? ? ?Tahmid Stonehocker G Alyson Ki, LCSW ? ? ? ? ? ? ? ? ? ? ? ? ? ? ?Joshaua Epple G Deivi Huckins, LCSW ? ? ? ? ? ? ? ? ? ? ? ? ? ? ?Cecilie Heidel G Joeph Szatkowski, LCSW ? ? ? ? ? ? ? ? ? ? ? ? ? ? ?Leilene Diprima G Zahrah Sutherlin, LCSW ? ? ? ? ? ? ? ? ? ? ? ? ? ? ?Chinara Hertzberg G Jhovani Griswold, LCSW ? ? ? ? ? ? ? ? ? ? ? ? ? ? ?Jaleeah Slight G Anayeli Arel, LCSW ? ? ? ? ? ? ? ? ? ? ? ? ? ? ?Corinthian Kemler G Renaldo Gornick, LCSW ? ? ? ? ? ? ? ? ? ? ? ? ? ? ?Bevan Disney G Malin Cervini, LCSW ? ? ? ? ? ? ? ? ? ? ? ? ? ? ?Willey Due G Kristena Wilhelmi, LCSW ? ? ? ? ? ? ? ? ? ? ? ? ? ? ?Jammi Morrissette G Yliana Gravois, LCSW ?

## 2021-04-29 ENCOUNTER — Ambulatory Visit: Payer: BC Managed Care – PPO | Admitting: Psychology

## 2021-04-29 DIAGNOSIS — F4321 Adjustment disorder with depressed mood: Secondary | ICD-10-CM

## 2021-04-29 DIAGNOSIS — Z634 Disappearance and death of family member: Secondary | ICD-10-CM | POA: Diagnosis not present

## 2021-04-29 DIAGNOSIS — F4310 Post-traumatic stress disorder, unspecified: Secondary | ICD-10-CM

## 2021-04-30 NOTE — Progress Notes (Signed)
[facility] Counselor/Therapist Progress Note ? ?Patient ID: Heidi [name], MRN: [id number],   ? ?Date:  04/29/2021 ? ?Time Spent: 60 minutes ? ?Treatment Type: Individual Therapy ? ?Reported Symptoms: sadness, irritability,  ? ?Mental Status Exam: ?Appearance:  Casual     ?Behavior: Appropriate  ?Motor: Normal  ?Speech/Language:  Normal Rate  ?Affect:  pleasant  ?Mood:  normal  ?Thought process: normal  ?Thought content:   WNL  ?Sensory/Perceptual disturbances:   WNL  ?Orientation: oriented to person, place, time/date, and situation  ?Attention: Good  ?Concentration: Good  ?Memory: WNL  ?Fund of knowledge:  Good  ?Insight:   Good  ?Judgment:  Good  ?Impulse Control: Good  ? ?Risk Assessment: ?Danger to Self:  No ?Self-injurious Behavior: No ?Danger to Others: No ?Duty to Warn:no ?Physical Aggression / Violence:No  ?Access to Firearms a concern: No  ?Gang Involvement:No  ? ?Subjective: The patient attended a face to face individual therapy session in the office today.  The patient presents as pleasant and cooperative.  The patient talked about wanting to work on herself because of the responsibility that she took for her her parents after her brother's suicide 15 or so years ago.  We talked about why she felt that she wanted to change something and apparently she had a conversation with her son and he has had some of the similar feelings that she had when her brother committed suicide.  She is very open with her son and does a great job of talking with him about issues.  We discussed that it may be that just by talking with her son about it that he will be able to work with it and work through it better as opposed to what happened with her and them not really talking about it.  We talked about it being her choice to take that responsibility on and not that her parents put that on her but that she just chose to take that on herself.  The patient is supposed to give some thought to this and we will  discuss more at a later date. ?Interventions: Grief Therapy, Insight Oriented and CBT ? ?Diagnosis:PTSD (post-traumatic stress disorder) ? ?Grief at loss of child ? ?Plan: Please see plan in Therapy charts with a target date of 09/23/2021.  The patient approved this plan and is currently maintaining progress.  Patient approved this treatment plan. ? ?Gerturde Kuba G Lakenya Riendeau, LCSW ? ? ? ? ? ? ? ? ? ? ? ? ? ? ? ? ? ?Keahi Mccarney G Ruslan Mccabe, LCSW ? ? ? ? ? ? ? ? ? ? ? ? ? ? ?Charlsie Fleeger G Laquashia Mergenthaler, LCSW ? ? ? ? ? ? ? ? ? ? ? ? ? ? ?Shakerra Red G Dilynn Munroe, LCSW ? ? ? ? ? ? ? ? ? ? ? ? ? ? ?Sharaine Delange G Clever Geraldo, LCSW ? ? ? ? ? ? ? ? ? ? ? ? ? ? ?Jenan Ellegood G Briarrose Shor, LCSW ? ? ? ? ? ? ? ? ? ? ? ? ? ? ?Fallou Hulbert G Peaches Vanoverbeke, LCSW ? ? ? ? ? ? ? ? ? ? ? ? ? ? ?Wynnie Pacetti G Analeia Ismael, LCSW ? ? ? ? ? ? ? ? ? ? ? ? ? ? ?Leahna Hewson G Venancio Chenier, LCSW ? ? ? ? ? ? ? ? ? ? ? ? ? ? ?Daunte Oestreich G Juanelle Trueheart, LCSW ? ? ? ? ? ? ? ? ? ? ? ? ? ? ?Zakhari Fogel G Darwin Guastella, LCSW ? ? ? ? ? ? ? ? ? ? ? ? ? ? ?  Kabella Cassidy G Guliana Weyandt, LCSW ? ? ? ? ? ? ? ? ? ? ? ? ? ? ?Dorthea Maina G Finley Chevez, LCSW ? ? ? ? ? ? ? ? ? ? ? ? ? ? ?Humza Tallerico G Amer Alcindor, LCSW ? ? ? ? ? ? ? ? ? ? ? ? ? ? ?Aceyn Kathol G Chade Pitner, LCSW ? ? ? ? ? ? ? ? ? ? ? ? ? ? ?Jaidalyn Schillo G Maudene Stotler, LCSW ?

## 2021-05-04 ENCOUNTER — Other Ambulatory Visit: Payer: Self-pay | Admitting: Sports Medicine

## 2021-05-04 ENCOUNTER — Other Ambulatory Visit (HOSPITAL_COMMUNITY): Payer: Self-pay | Admitting: Sports Medicine

## 2021-05-04 DIAGNOSIS — M778 Other enthesopathies, not elsewhere classified: Secondary | ICD-10-CM

## 2021-05-04 DIAGNOSIS — M25511 Pain in right shoulder: Secondary | ICD-10-CM

## 2021-05-06 ENCOUNTER — Ambulatory Visit: Payer: BC Managed Care – PPO | Admitting: Psychology

## 2021-05-06 DIAGNOSIS — Z634 Disappearance and death of family member: Secondary | ICD-10-CM

## 2021-05-06 DIAGNOSIS — F4321 Adjustment disorder with depressed mood: Secondary | ICD-10-CM | POA: Diagnosis not present

## 2021-05-06 DIAGNOSIS — F4310 Post-traumatic stress disorder, unspecified: Secondary | ICD-10-CM

## 2021-05-06 NOTE — Progress Notes (Signed)
[facility] Counselor/Therapist Progress Note ? ?Patient ID: Heidi [name], MRN: [id number],   ? ?Date:  05/06/2021 ? ?Time Spent: 60 minutes ? ?Treatment Type: Individual Therapy ? ?Reported Symptoms: sadness, irritability,  ? ?Mental Status Exam: ?Appearance:  Casual     ?Behavior: Appropriate  ?Motor: Normal  ?Speech/Language:  Normal Rate  ?Affect:  pleasant  ?Mood:  normal  ?Thought process: normal  ?Thought content:   WNL  ?Sensory/Perceptual disturbances:   WNL  ?Orientation: oriented to person, place, time/date, and situation  ?Attention: Good  ?Concentration: Good  ?Memory: WNL  ?Fund of knowledge:  Good  ?Insight:   Good  ?Judgment:  Good  ?Impulse Control: Good  ? ?Risk Assessment: ?Danger to Self:  No ?Self-injurious Behavior: No ?Danger to Others: No ?Duty to Warn:no ?Physical Aggression / Violence:No  ?Access to Firearms a concern: No  ?Gang Involvement:No  ? ?Subjective: The patient attended a face to face individual therapy session in the office today.  The patient seems a little irritable today.  The patient talked about being frustrated because she is looking for a job and has not been able to come across anything yet.  I recommended that she think about what she would like to manifest for herself and think about how she would like to move forward to have a better idea about the details of what she is looking for.  The patient seems irritable about this and is frustrated because she has not been able to find anything to this date.  The patient reports that everything else is going okay and she has decided to skip the next 2 sessions because they would be virtual and she prefers in person. ? ?Interventions: Grief Therapy, Insight Oriented and CBT ? ?Diagnosis:PTSD (post-traumatic stress disorder) ? ?Grief at loss of child ? ?Plan: Please see plan in Therapy charts with a target date of 09/23/2021.  The patient approved this plan and is currently maintaining progress.  Patient  approved this treatment plan. ? ?Treniece Holsclaw G Chue Berkovich, LCSW ? ? ? ? ? ? ? ? ? ? ? ? ? ? ? ? ? ?Leilany Digeronimo G Nyelah Emmerich, LCSW ? ? ? ? ? ? ? ? ? ? ? ? ? ? ?Damyra Luscher G Delonte Musich, LCSW ? ? ? ? ? ? ? ? ? ? ? ? ? ? ?Dynasty Holquin G Muranda Coye, LCSW ? ? ? ? ? ? ? ? ? ? ? ? ? ? ?Yurem Viner G Mittie Knittel, LCSW ? ? ? ? ? ? ? ? ? ? ? ? ? ? ?Bradee Common G Ameli Sangiovanni, LCSW ? ? ? ? ? ? ? ? ? ? ? ? ? ? ?Amiley Shishido G Leonore Frankson, LCSW ? ? ? ? ? ? ? ? ? ? ? ? ? ? ?Janeese Mcgloin G Alexzandria Massman, LCSW ? ? ? ? ? ? ? ? ? ? ? ? ? ? ?Blayre Papania G Lorain Keast, LCSW ? ? ? ? ? ? ? ? ? ? ? ? ? ? ?Admire Bunnell G Darrelle Wiberg, LCSW ? ? ? ? ? ? ? ? ? ? ? ? ? ? ?Lalia Loudon G Shasta Chinn, LCSW ? ? ? ? ? ? ? ? ? ? ? ? ? ? ?[place] Droll G Timohty Renbarger, LCSW ? ? ? ? ? ? ? ? ? ? ? ? ? ? ?Maiyah Goyne G Keisuke Hollabaugh, LCSW ? ? ? ? ? ? ? ? ? ? ? ? ? ? ?Karmel Patricelli G Rune Mendez, LCSW ? ? ? ? ? ? ? ? ? ? ? ? ? ? ?Kylea Berrong G Jemell Town,  LCSW ? ? ? ? ? ? ? ? ? ? ? ? ? ? ?Heitor Steinhoff G Tania Steinhauser, LCSW ? ? ? ? ? ? ? ? ? ? ? ? ? ? ?Dorise Gangi G Lynnell Fiumara, LCSW ?

## 2021-05-09 ENCOUNTER — Ambulatory Visit
Admission: RE | Admit: 2021-05-09 | Discharge: 2021-05-09 | Disposition: A | Discharge status: Home / Self Care | Payer: BC Managed Care – PPO | Source: Ambulatory Visit | Attending: Sports Medicine | Admitting: Sports Medicine

## 2021-05-09 DIAGNOSIS — M25511 Pain in right shoulder: Secondary | ICD-10-CM | POA: Insufficient documentation

## 2021-05-09 DIAGNOSIS — M778 Other enthesopathies, not elsewhere classified: Secondary | ICD-10-CM | POA: Diagnosis present

## 2021-05-13 ENCOUNTER — Ambulatory Visit: Payer: BC Managed Care – PPO | Admitting: Psychology

## 2021-05-20 ENCOUNTER — Ambulatory Visit: Payer: BC Managed Care – PPO | Admitting: Psychology

## 2021-05-27 ENCOUNTER — Ambulatory Visit: Payer: BC Managed Care – PPO | Admitting: Psychology

## 2021-05-27 DIAGNOSIS — F4310 Post-traumatic stress disorder, unspecified: Secondary | ICD-10-CM | POA: Diagnosis not present
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
## 2021-06-03 ENCOUNTER — Ambulatory Visit: Payer: BC Managed Care – PPO | Admitting: Psychology

## 2021-06-03 DIAGNOSIS — F4321 Adjustment disorder with depressed mood: Secondary | ICD-10-CM | POA: Diagnosis not present

## 2021-06-03 DIAGNOSIS — Z634 Disappearance and death of family member: Secondary | ICD-10-CM | POA: Diagnosis not present

## 2021-06-03 DIAGNOSIS — F4310 Post-traumatic stress disorder, unspecified: Secondary | ICD-10-CM
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
## 2021-06-04 ENCOUNTER — Other Ambulatory Visit: Payer: Self-pay | Admitting: Family Medicine

## 2021-06-04 DIAGNOSIS — M359 Systemic involvement of connective tissue, unspecified: Secondary | ICD-10-CM

## 2021-06-10 ENCOUNTER — Ambulatory Visit: Payer: BC Managed Care – PPO | Admitting: Psychology

## 2021-06-10 DIAGNOSIS — Z634 Disappearance and death of family member: Secondary | ICD-10-CM | POA: Diagnosis not present

## 2021-06-10 DIAGNOSIS — F4310 Post-traumatic stress disorder, unspecified: Secondary | ICD-10-CM | POA: Diagnosis not present

## 2021-06-10 DIAGNOSIS — F4321 Adjustment disorder with depressed mood: Secondary | ICD-10-CM

## 2021-06-10 NOTE — Progress Notes (Signed)
[facility] Counselor/Therapist Progress Note  Patient ID: Heidi [name], MRN: [id number],    Date:  06/10/2021  Time Spent: 60 minutes  Treatment Type: Individual Therapy  Reported Symptoms: sadness, irritability,   Mental Status Exam: Appearance:  Casual     Behavior: Appropriate  Motor: Normal  Speech/Language:  Normal Rate  Affect:  pleasant  Mood:  normal  Thought process: normal  Thought content:   WNL  Sensory/Perceptual disturbances:   WNL  Orientation: oriented to person, place, time/date, and situation  Attention: Good  Concentration: Good  Memory: WNL  Fund of knowledge:  Good  Insight:   Good  Judgment:  Good  Impulse Control: Good   Risk Assessment: Danger to Self:  No Self-injurious Behavior: No Danger to Others: No Duty to Warn:no Physical Aggression / Violence:No  Access to Firearms a concern: No  Gang Involvement:No   Subjective: The patient attended a face to face individual therapy session in the office today.  The patient presents with a blunted affect and mood is pleasant.  The patient reports that she has been worrying more about her son Heidi [name].  She states that since he broke up with his girlfriend she has been more worried about his drinking behavior and his health and safety.  She reports that she needs some way to deal with her anxiety about this.  Today we talked about "what if" thinking and how to manage that so that you do not struggle as much.  We also talked about the need for her to do some meditation and mindfulness to just be at some times because she is always in doing mode.  In addition we talked about coming up with a plan with her son so that she can know that he is basically going to be okay and possibly him either calling her or calling her boyfriend or calling him over when he is drinking or has drank more than 2 beers.  Interventions: Grief Therapy, Insight Oriented and CBT  Diagnosis:PTSD (post-traumatic stress  disorder)  Grief at loss of child  Plan: Please see plan inClient Abilities/Strengths  Intelligent, ability for insight, motivated  Client Treatment Preferences  Outpatient Individual therapy  Client Statement of Needs  "I need some therapy to deal with my trauma and my grief"  Treatment Level  Outpatient Individual therapy  Symptoms  Experiences disturbances in sleep.: (Status: maintained). Experiences  disturbing and persistent thoughts, images, and/or perceptions of the traumatic event.: (Status: maintained). Has been exposed to a traumatic event involving actual or perceived  threat of death or serious injury.: (Status: maintained). Impairment in social,  occupational, or other areas of functioning.:  (Status: maintained). Reports  difficulty concentrating as well as feelings of guilt.: (Status: maintained).  Thoughts dominated by loss coupled with poor concentration, tearful spells, and confusion about the  future.: (Status: maintained).  Problems Addressed  Grief / Loss Unresolved, Posttraumatic Stress Disorder (PTSD)  Goals 1. Begin a healthy grieving process around the loss. Objective Begin verbalizing feelings associated with the loss. Target Date: 2021-09-23 Frequency: Weekly Progress: 0 Modality: individual Related Interventions 1. Assist the client in identifying and expressing feelings connected with his/her loss. Objective Tell in detail the story of the current loss that is triggering symptoms. Target Date: 2021-09-23 Frequency: Weekly Progress: 0 Modality: individual Related Interventions 1. Create a safe environment for disclosure and actively build the level of trust with the client in  individual sessions through consistent eye contact, active listening, unconditional positive  regard, and  warm acceptance to help increase his/her ability to identify and express thoughts  and feelings. 2. Use empathy, compassion, and support, allowing the client to tell in  detail the story of his/her  recent loss. 2. Returns to the level of psychological functioning prior to exposure to  the traumatic event. Objective Participate in Eye Movement Desensitization and Reprocessing (EMDR) to reduce emotional distress  related to traumatic thoughts, feelings, and images. Target Date: 2021-09-23 Frequency: Weekly Progress: 0 Modality: individual Related Interventions 1. Utilize Eye Movement Desensitization and Reprocessing (EMDR) to reduce the client's  emotional reactivity to the traumatic event and reduce PTSD symptoms. Objective Learn and implement guided self-dialogue to manage thoughts, feelings, and urges brought on by  encounters with trauma-related situations. Target Date: 2021-09-23 Frequency: Weekly Progress: 0 Modality: individual  Related Interventions 1. Teach the client a guided self-dialogue procedure in which he/she learns to recognize  maladaptive self-talk, challenges its biases, copes with engendered feelings, overcomes  avoidance, and reinforces his/her accomplishments; review and reinforce progress, problemsolve obstacles. Diagnosis Axis  F43.10 (Posttraumatic stress disorder) - Posttraumatic Stress  Disorder  Conditions For Discharge Achievement of treatment goals and objectives  The patient approved this plan and is currently maintaining progress.    [name], LCSW                  [name], LCSW               [name], LCSW               [name], LCSW               [name], LCSW               [name], LCSW               [name], LCSW               [name], LCSW               [name], LCSW               [name], LCSW               [name], LCSW               [name],  LCSW               [name], LCSW               [name], LCSW               [name], LCSW               [name], LCSW               Ionna Avis G Eris Hannan, LCSW               Hermila Millis G Kruze Atchley, LCSW               Sincere Berlanga G Indiana Pech[employer]k, LCSW               Kristopher Attwood G Jacklynn Dehaas, LCSW

## 2021-06-17 ENCOUNTER — Ambulatory Visit: Payer: BC Managed Care – PPO | Admitting: Psychology

## 2021-06-17 DIAGNOSIS — F4321 Adjustment disorder with depressed mood: Secondary | ICD-10-CM

## 2021-06-17 DIAGNOSIS — Z634 Disappearance and death of family member: Secondary | ICD-10-CM | POA: Diagnosis not present

## 2021-06-17 DIAGNOSIS — F4310 Post-traumatic stress disorder, unspecified: Secondary | ICD-10-CM | POA: Diagnosis not present

## 2021-06-18 NOTE — Progress Notes (Signed)
[facility] Counselor/Therapist Progress Note  Patient ID: Heidi [name], MRN: [id number],    Date:  06/17/2021  Time Spent: 60 minutes  Treatment Type: Individual Therapy  Reported Symptoms: sadness, irritability,   Mental Status Exam: Appearance:  Casual     Behavior: Appropriate  Motor: Normal  Speech/Language:  Normal Rate  Affect:  pleasant  Mood:  normal  Thought process: normal  Thought content:   WNL  Sensory/Perceptual disturbances:   WNL  Orientation: oriented to person, place, time/date, and situation  Attention: Good  Concentration: Good  Memory: WNL  Fund of knowledge:  Good  Insight:   Good  Judgment:  Good  Impulse Control: Good   Risk Assessment: Danger to Self:  No Self-injurious Behavior: No Danger to Others: No Duty to Warn:no Physical Aggression / Violence:No  Access to Firearms a concern: No  Gang Involvement:No   Subjective: The patient attended a face to face individual therapy session in the office today.  The patient presents as pleasant and cooperative.  The patient reports that she ended up having to go to [place] this past weekend because her ex-husband had a heart attack and she needed to provide support for him.  She also reports that her son went as well and they are working together to take care of her ex-husband as he has limited limited support.  The patient seems to be doing okay with the grief of losing her son and dealing with what is going on in her life at present.  We talked about her continuing to do what she needs to do to take care of herself and this weekend she is going to be relaxing with her significant other at her house.  The patient talked about having concerns for her son that is currently living in [place] as he recently broke up with his girlfriend.  We talked about her supporting everyone else and I just encouraged her to continue to maintain some kind of balance.  Interventions: Grief  Therapy, Insight Oriented and CBT  Diagnosis:PTSD (post-traumatic stress disorder)  Grief at loss of child  Plan: Please see plan inClient Abilities/Strengths  Intelligent, ability for insight, motivated  Client Treatment Preferences  Outpatient Individual therapy  Client Statement of Needs  "I need some therapy to deal with my trauma and my grief"  Treatment Level  Outpatient Individual therapy  Symptoms  Experiences disturbances in sleep.: (Status: maintained). Experiences  disturbing and persistent thoughts, images, and/or perceptions of the traumatic event.: (Status: maintained). Has been exposed to a traumatic event involving actual or perceived  threat of death or serious injury.: (Status: maintained). Impairment in social,  occupational, or other areas of functioning.:  (Status: maintained). Reports  difficulty concentrating as well as feelings of guilt.: (Status: maintained).  Thoughts dominated by loss coupled with poor concentration, tearful spells, and confusion about the  future.: (Status: maintained).  Problems Addressed  Grief / Loss Unresolved, Posttraumatic Stress Disorder (PTSD)  Goals 1. Begin a healthy grieving process around the loss. Objective Begin verbalizing feelings associated with the loss. Target Date: 2021-09-23 Frequency: Weekly Progress: 0 Modality: individual Related Interventions 1. Assist the client in identifying and expressing feelings connected with his/her loss. Objective Tell in detail the story of the current loss that is triggering symptoms. Target Date: 2021-09-23 Frequency: Weekly Progress: 0 Modality: individual Related Interventions 1. Create a safe environment for disclosure and actively build the level of trust with the client in  individual sessions through consistent eye contact,  active listening, unconditional positive  regard, and warm acceptance to help increase his/her ability to identify and express thoughts  and  feelings. 2. Use empathy, compassion, and support, allowing the client to tell in detail the story of his/her  recent loss. 2. Returns to the level of psychological functioning prior to exposure to  the traumatic event. Objective Participate in Eye Movement Desensitization and Reprocessing (EMDR) to reduce emotional distress  related to traumatic thoughts, feelings, and images. Target Date: 2021-09-23 Frequency: Weekly Progress: 0 Modality: individual Related Interventions 1. Utilize Eye Movement Desensitization and Reprocessing (EMDR) to reduce the client's  emotional reactivity to the traumatic event and reduce PTSD symptoms. Objective Learn and implement guided self-dialogue to manage thoughts, feelings, and urges brought on by  encounters with trauma-related situations. Target Date: 2021-09-23 Frequency: Weekly Progress: 0 Modality: individual  Related Interventions 1. Teach the client a guided self-dialogue procedure in which he/she learns to recognize  maladaptive self-talk, challenges its biases, copes with engendered feelings, overcomes  avoidance, and reinforces his/her accomplishments; review and reinforce progress, problemsolve obstacles. Diagnosis Axis  F43.10 (Posttraumatic stress disorder) - Posttraumatic Stress  Disorder  Conditions For Discharge Achievement of treatment goals and objectives  The patient approved this plan and is currently maintaining progress.    Zaelyn Barbary G Jamaris Theard, LCSW                  Sabas Frett G [name], LCSW               Mia Winthrop G Zaidee Rion, LCSW               Amazing Cowman G Gaetana Kawahara, LCSW               Jaloni Sorber G Kalai Baca, LCSW               Darlyn Repsher G Akil Hoos, LCSW               Rosiland Sen [employer], LCSW               Caidence Kaseman [employer], LCSW               Encarnacion Bole [employer], LCSW               Aseel Uhde [employer], LCSW               Xavian Hardcastle [employer], LCSW               Daron Breeding G Lan Mcneill, LCSW               Duston Smolenski G Jeanean Hollett, LCSW               Jakerra Floyd G Anaiah Mcmannis, LCSW               Xai Frerking G Navika Hoopes, LCSW               Rishita Petron G Taleah Bellantoni, LCSW               Kirsten Mckone G Garlan Drewes, LCSW               Timmey Lamba G [employer], LCSW               Jatorian Renault G Rosmery Duggin, LCSW               Trinity Hyland G Taiwan Millon, LCSW               Kerisha Goughnour G Leonila Speranza, LCSW

## 2021-06-24 ENCOUNTER — Ambulatory Visit: Payer: BC Managed Care – PPO | Admitting: Psychology

## 2021-07-01 ENCOUNTER — Ambulatory Visit (INDEPENDENT_AMBULATORY_CARE_PROVIDER_SITE_OTHER): Payer: BC Managed Care – PPO | Admitting: Psychology

## 2021-07-01 DIAGNOSIS — F4310 Post-traumatic stress disorder, unspecified: Secondary | ICD-10-CM

## 2021-07-01 DIAGNOSIS — Z634 Disappearance and death of family member: Secondary | ICD-10-CM

## 2021-07-01 DIAGNOSIS — F4321 Adjustment disorder with depressed mood: Secondary | ICD-10-CM

## 2021-07-08 ENCOUNTER — Ambulatory Visit (INDEPENDENT_AMBULATORY_CARE_PROVIDER_SITE_OTHER): Payer: BC Managed Care – PPO | Admitting: Psychology

## 2021-07-08 DIAGNOSIS — F4321 Adjustment disorder with depressed mood: Secondary | ICD-10-CM | POA: Diagnosis not present

## 2021-07-08 DIAGNOSIS — F4310 Post-traumatic stress disorder, unspecified: Secondary | ICD-10-CM | POA: Diagnosis not present

## 2021-07-08 DIAGNOSIS — Z634 Disappearance and death of family member: Secondary | ICD-10-CM

## 2021-07-09 NOTE — Progress Notes (Signed)
[facility] Counselor/Therapist Progress Note  Patient ID: Heidi [name], MRN: [id number],    Date:  07/08/2021  Time Spent: 60 minutes  Treatment Type: Individual Therapy  Reported Symptoms: sadness, irritability,   Mental Status Exam: Appearance:  Casual     Behavior: Appropriate  Motor: Normal  Speech/Language:  Normal Rate  Affect:  pleasant  Mood:  normal  Thought process: normal  Thought content:   WNL  Sensory/Perceptual disturbances:   WNL  Orientation: oriented to person, place, time/date, and situation  Attention: Good  Concentration: Good  Memory: WNL  Fund of knowledge:  Good  Insight:   Good  Judgment:  Good  Impulse Control: Good   Risk Assessment: Danger to Self:  No Self-injurious Behavior: No Danger to Others: No Duty to Warn:no Physical Aggression / Violence:No  Access to Firearms a concern: No  Gang Involvement:No   Subjective: The patient attended a face to face individual therapy session in the office today.  The patient presents as pleasant and cooperative.  The patient reports that she has been doing okay over the last week.  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  The patient talked about being frustrated with looking for another job.  We talked about the need for her to continue looking.  She also talked about how she feels about her job.  We talked about how to reframe this in her mind.  The patient is doing okay with her grief about losing her son however the anniversary date is coming up in August.  Interventions: Grief Therapy, Insight Oriented and CBT  Diagnosis:PTSD (post-traumatic stress disorder)  Grief at loss of child  Plan: Please see plan inClient Abilities/Strengths  Intelligent, ability for insight, motivated  Client Treatment Preferences  Outpatient Individual therapy  Client Statement of Needs  "I need some therapy to deal with my trauma and my grief"  Treatment  Level  Outpatient Individual therapy  Symptoms  Experiences disturbances in sleep.: (Status: improved). Experiences  disturbing and persistent thoughts, images, and/or perceptions of the traumatic event.: (Status: improved). Has been exposed to a traumatic event involving actual or perceived  threat of death or serious injury.: (Status: maintained). Impairment in social,  occupational, or other areas of functioning.:  (Status: improved). Reports  difficulty concentrating as well as feelings of guilt.: (Status: improved).  Thoughts dominated by loss coupled with poor concentration, tearful spells, and confusion about the  future.: (Status: Improved).  Problems Addressed  Grief / Loss Unresolved, Posttraumatic Stress Disorder (PTSD)  Goals 1. Begin a healthy grieving process around the loss. Objective Begin verbalizing feelings associated with the loss. Target Date: 2021-09-23 Frequency: Weekly Progress: 50 Modality: individual Related Interventions 1. Assist the client in identifying and expressing feelings connected with his/her loss. Objective Tell in detail the story of the current loss that is triggering symptoms. Target Date: 2021-09-23 Frequency: Weekly Progress: 50 Modality: individual Related Interventions 1. Create a safe environment for disclosure and actively build the level of trust with the client in  individual sessions through consistent eye contact, active listening, unconditional positive  regard, and warm acceptance to help increase his/her ability to identify and express thoughts  and feelings. 2. Use empathy, compassion, and support, allowing the client to tell in detail the story of his/her  recent loss. 2. Returns to the level of psychological functioning prior to exposure to  the traumatic event. Objective Participate in Eye Movement Desensitization and Reprocessing (EMDR) to  reduce emotional distress  related to traumatic thoughts, feelings, and  images. Target Date: 2021-09-23 Frequency: Weekly Progress: 0 Modality: individual Related Interventions 1. Utilize Eye Movement Desensitization and Reprocessing (EMDR) to reduce the client's  emotional reactivity to the traumatic event and reduce PTSD symptoms. Objective Learn and implement guided self-dialogue to manage thoughts, feelings, and urges brought on by  encounters with trauma-related situations. Target Date: 2021-09-23 Frequency: Weekly Progress: 0 Modality: individual  Related Interventions 1. Teach the client a guided self-dialogue procedure in which he/she learns to recognize  maladaptive self-talk, challenges its biases, copes with engendered feelings, overcomes  avoidance, and reinforces his/her accomplishments; review and reinforce progress, problemsolve obstacles. Diagnosis Axis  F43.10 (Posttraumatic stress disorder) - Posttraumatic Stress  Disorder  Conditions For Discharge Achievement of treatment goals and objectives  The patient approved this plan and is currently maintaining progress.    Conrado Nance G Justis Dupas, LCSW                  Baylen Dea G Charlet Harr, LCSW               Everson Mott G Analeigh Aries, LCSW               Armando Bukhari G Landri Dorsainvil, LCSW               Sylvia Kondracki G Jazzmon Prindle, LCSW               Lean Jaeger [employer], LCSW               Lofton Leon [employer], LCSW               Ritamarie Arkin [employer], LCSW               Jakyle Petrucelli [employer], LCSW               Saharra Santo [employer], LCSW               Forbes Loll [employer], LCSW               Raheel Kunkle [employer], LCSW               Jovanna Hodges G Zailee Vallely, LCSW               Yolanda Huffstetler G Kimberlie Csaszar, LCSW               Hutton Pellicane G Yezenia Fredrick, LCSW               Dallon Dacosta G Carmine Youngberg, LCSW               Mallary Kreger G Jairo Bellew, LCSW               Kyli Sorter G Jehad Bisono,  LCSW               Bay Jarquin G Wenceslao Loper, LCSW               Lejla Moeser G Tiago Humphrey, LCSW               Verlie Hellenbrand G Arkin Imran, LCSW               Zyrion Coey G Shizuo Biskup, LCSW               Ninnie Fein G Fina Heizer, LCSW

## 2021-07-15 ENCOUNTER — Ambulatory Visit: Payer: BC Managed Care – PPO | Admitting: Psychology

## 2021-07-15 DIAGNOSIS — Z634 Disappearance and death of family member: Secondary | ICD-10-CM

## 2021-07-15 DIAGNOSIS — F4310 Post-traumatic stress disorder, unspecified: Secondary | ICD-10-CM

## 2021-07-15 DIAGNOSIS — F4321 Adjustment disorder with depressed mood: Secondary | ICD-10-CM | POA: Diagnosis not present

## 2021-07-16 NOTE — Progress Notes (Signed)
[facility] Counselor/Therapist Progress Note  Patient ID: Heidi [name], MRN: [id number],    Date:  07/15/2021  Time Spent: 60 minutes  Treatment Type: Individual Therapy  Reported Symptoms: sadness, irritability,   Mental Status Exam: Appearance:  Casual     Behavior: Appropriate  Motor: Normal  Speech/Language:  Normal Rate  Affect:  pleasant  Mood:  normal  Thought process: normal  Thought content:   WNL  Sensory/Perceptual disturbances:   WNL  Orientation: oriented to person, place, time/date, and situation  Attention: Good  Concentration: Good  Memory: WNL  Fund of knowledge:  Good  Insight:   Good  Judgment:  Good  Impulse Control: Good   Risk Assessment: Danger to Self:  No Self-injurious Behavior: No Danger to Others: No Duty to Warn:no Physical Aggression / Violence:No  Access to Firearms a concern: No  Gang Involvement:No   Subjective: The patient attended a face to face individual therapy session in the office today.  The patient presents as pleasant and cooperative.  The patient reports that she has been very weepy the last few days.  She states that she is struggling and not sure why.  We talked about the possibility that she could be anticipating the anniversary of his death date.  We also talked about the possibility that she could subconsciously be struggling a little because she is going to a class reunion this weekend and may have to feel questions about her son.  She states that she does not think it is the latter but she does think that it is possibly related to the anniversary date.  We talked about how to allow herself to go through the process of grieving and that this was normal for her to have these feelings. Interventions: Grief Therapy, Insight Oriented and CBT  Diagnosis:PTSD (post-traumatic stress disorder)  Grief at loss of child  Plan: Please see plan inClient Abilities/Strengths  Intelligent, ability for insight,  motivated  Client Treatment Preferences  Outpatient Individual therapy  Client Statement of Needs  "I need some therapy to deal with my trauma and my grief"  Treatment Level  Outpatient Individual therapy  Symptoms  Experiences disturbances in sleep.: (Status: improved). Experiences  disturbing and persistent thoughts, images, and/or perceptions of the traumatic event.: (Status: improved). Has been exposed to a traumatic event involving actual or perceived  threat of death or serious injury.: (Status: maintained). Impairment in social,  occupational, or other areas of functioning.:  (Status: improved). Reports  difficulty concentrating as well as feelings of guilt.: (Status: improved).  Thoughts dominated by loss coupled with poor concentration, tearful spells, and confusion about the  future.: (Status: Improved).  Problems Addressed  Grief / Loss Unresolved, Posttraumatic Stress Disorder (PTSD)  Goals 1. Begin a healthy grieving process around the loss. Objective Begin verbalizing feelings associated with the loss. Target Date: 2021-09-23 Frequency: Weekly Progress: 50 Modality: individual Related Interventions 1. Assist the client in identifying and expressing feelings connected with his/her loss. Objective Tell in detail the story of the current loss that is triggering symptoms. Target Date: 2021-09-23 Frequency: Weekly Progress: 50 Modality: individual Related Interventions 1. Create a safe environment for disclosure and actively build the level of trust with the client in  individual sessions through consistent eye contact, active listening, unconditional positive  regard, and warm acceptance to help increase his/her ability to identify and express thoughts  and feelings. 2. Use empathy, compassion, and support, allowing the client to tell in detail the story of his/her  recent loss. 2. Returns to the level of psychological functioning prior to exposure to  the traumatic  event. Objective Participate in Eye Movement Desensitization and Reprocessing (EMDR) to reduce emotional distress  related to traumatic thoughts, feelings, and images. Target Date: 2021-09-23 Frequency: Weekly Progress: 0 Modality: individual Related Interventions 1. Utilize Eye Movement Desensitization and Reprocessing (EMDR) to reduce the client's  emotional reactivity to the traumatic event and reduce PTSD symptoms. Objective Learn and implement guided self-dialogue to manage thoughts, feelings, and urges brought on by  encounters with trauma-related situations. Target Date: 2021-09-23 Frequency: Weekly Progress: 0 Modality: individual  Related Interventions 1. Teach the client a guided self-dialogue procedure in which he/she learns to recognize  maladaptive self-talk, challenges its biases, copes with engendered feelings, overcomes  avoidance, and reinforces his/her accomplishments; review and reinforce progress, problemsolve obstacles. Diagnosis Axis  F43.10 (Posttraumatic stress disorder) - Posttraumatic Stress  Disorder  Conditions For Discharge Achievement of treatment goals and objectives  The patient approved this plan and is currently maintaining progress.    Pleshette Tomasini G Glenette Bookwalter, LCSW                  Izrael Peak G Martese Vanatta, LCSW               Haynes Giannotti G Aulani Shipton, LCSW               Mathilde Mcwherter G Dougles Kimmey, LCSW               Lexys Milliner G Nannette Zill, LCSW               Jaylen Knope [employer], LCSW               Ameria Sanjurjo [employer], LCSW               Briley Sulton [employer], LCSW               Camaryn Lumbert [employer], LCSW               Cordon Gassett [employer], LCSW               Kayana Thoen [employer], LCSW               Maysen Sudol [employer], LCSW               Cyndie Woodbeck [employer], LCSW               Dauntae Derusha G Aryanna Shaver, LCSW               Azarion Hove G Sylvania Moss,  LCSW               Ellionna Buckbee G Mylinda Brook, LCSW               Merlen Gurry G Antione Obar, LCSW               Kolsen Choe G Arneisha Kincannon, LCSW               Amora Sheehy G Bahja Bence, LCSW               Aideliz Garmany G Andrik Sandt, LCSW               Ardie Dragoo G Shakiyla Kook, LCSW               Sherwin Hollingshed G Tafari Humiston, LCSW               Railynn Ballo G Christopher Hink, LCSW               Daksha Koone G Abagayle Klutts,  LCSW

## 2021-07-22 ENCOUNTER — Ambulatory Visit: Payer: BC Managed Care – PPO | Admitting: Psychology

## 2021-07-22 DIAGNOSIS — F4310 Post-traumatic stress disorder, unspecified: Secondary | ICD-10-CM

## 2021-07-22 DIAGNOSIS — F4321 Adjustment disorder with depressed mood: Secondary | ICD-10-CM

## 2021-07-22 DIAGNOSIS — Z634 Disappearance and death of family member: Secondary | ICD-10-CM

## 2021-07-22 NOTE — Progress Notes (Signed)
[facility] Counselor/Therapist Progress Note  Patient ID: Heidi [name], MRN: [id number],    Date:  07/22/2021  Time Spent: 60 minutes  Treatment Type: Individual Therapy  Reported Symptoms: sadness, irritability,   Mental Status Exam: Appearance:  Casual     Behavior: Appropriate  Motor: Normal  Speech/Language:  Normal Rate  Affect:  pleasant  Mood:  normal  Thought process: normal  Thought content:   WNL  Sensory/Perceptual disturbances:   WNL  Orientation: oriented to person, place, time/date, and situation  Attention: Good  Concentration: Good  Memory: WNL  Fund of knowledge:  Good  Insight:   Good  Judgment:  Good  Impulse Control: Good   Risk Assessment: Danger to Self:  No Self-injurious Behavior: No Danger to Others: No Duty to Warn:no Physical Aggression / Violence:No  Access to Firearms a concern: No  Gang Involvement:No   Subjective: The patient attended a face to face individual therapy session in the office today.  The patient presents as pleasant and cooperative.  The patient reports that she had a great time at her high school reunion.  We talked about how she felt and what she did.  She ask about dealing with her son's belongings and she reports that she does not quite feel ready yet to do anything with that and was questioning whether that was normal or not.  I explained to her that it has not been that long since he passed and that she could wait for a while before she decided to get rid of some of his belongings.  In addition she talked about her concerns about looking for a job and we discussed her continuing to think about what it is that she wants to do and continue to apply for jobs.  The patient is doing well and seems to be managing her grief relatively well.  Interventions: Grief Therapy, Insight Oriented and CBT  Diagnosis:PTSD (post-traumatic stress disorder)  Grief at loss of child  Plan: Please see plan inClient  Abilities/Strengths  Intelligent, ability for insight, motivated  Client Treatment Preferences  Outpatient Individual therapy  Client Statement of Needs  "I need some therapy to deal with my trauma and my grief"  Treatment Level  Outpatient Individual therapy  Symptoms  Experiences disturbances in sleep.: (Status: improved). Experiences  disturbing and persistent thoughts, images, and/or perceptions of the traumatic event.: (Status: improved). Has been exposed to a traumatic event involving actual or perceived  threat of death or serious injury.: (Status: maintained). Impairment in social,  occupational, or other areas of functioning.:  (Status: improved). Reports  difficulty concentrating as well as feelings of guilt.: (Status: improved).  Thoughts dominated by loss coupled with poor concentration, tearful spells, and confusion about the  future.: (Status: Improved).  Problems Addressed  Grief / Loss Unresolved, Posttraumatic Stress Disorder (PTSD)  Goals 1. Begin a healthy grieving process around the loss. Objective Begin verbalizing feelings associated with the loss. Target Date: 2021-09-23 Frequency: Weekly Progress: 50 Modality: individual Related Interventions 1. Assist the client in identifying and expressing feelings connected with his/her loss. Objective Tell in detail the story of the current loss that is triggering symptoms. Target Date: 2021-09-23 Frequency: Weekly Progress: 50 Modality: individual Related Interventions 1. Create a safe environment for disclosure and actively build the level of trust with the client in  individual sessions through consistent eye contact, active listening, unconditional positive  regard, and warm acceptance to help increase his/her ability to identify and express thoughts  and feelings.  2. Use empathy, compassion, and support, allowing the client to tell in detail the story of his/her  recent loss. 2. Returns to the level of  psychological functioning prior to exposure to  the traumatic event. Objective Participate in Eye Movement Desensitization and Reprocessing (EMDR) to reduce emotional distress  related to traumatic thoughts, feelings, and images. Target Date: 2021-09-23 Frequency: Weekly Progress: 0 Modality: individual Related Interventions 1. Utilize Eye Movement Desensitization and Reprocessing (EMDR) to reduce the client's  emotional reactivity to the traumatic event and reduce PTSD symptoms. Objective Learn and implement guided self-dialogue to manage thoughts, feelings, and urges brought on by  encounters with trauma-related situations. Target Date: 2021-09-23 Frequency: Weekly Progress: 0 Modality: individual  Related Interventions 1. Teach the client a guided self-dialogue procedure in which he/she learns to recognize  maladaptive self-talk, challenges its biases, copes with engendered feelings, overcomes  avoidance, and reinforces his/her accomplishments; review and reinforce progress, problemsolve obstacles. Diagnosis Axis  F43.10 (Posttraumatic stress disorder) - Posttraumatic Stress  Disorder  Conditions For Discharge Achievement of treatment goals and objectives  The patient approved this plan and is currently maintaining progress.    Natash Berman G Ryliegh Mcduffey, LCSW                  Kaileb Monsanto G Elzora Cullins, LCSW               Bree Heinzelman G Yoshi Vicencio, LCSW               Tamilyn Lupien G Emylia Latella, LCSW               Elynore Dolinski G Ellenora Talton, LCSW               Pinky Ravan [employer], LCSW               Kasiah Manka [employer], LCSW               Arlind Klingerman [employer], LCSW               Kyliegh Jester [employer], LCSW               Miyeko Mahlum [employer], LCSW               Courteny Egler [employer], LCSW               Donnel Venuto [employer], LCSW               Reginald Mangels [employer], LCSW               Oluwatimilehin Balfour G  Shraga Custard, LCSW               Sherrol Vicars G Siddarth Hsiung, LCSW               Kaylin Marcon G Calyssa Zobrist, LCSW               Daisa Stennis G Brendolyn Stockley, LCSW               Naiara Lombardozzi G Kiala Faraj, LCSW               Talor Cheema G Trinika Cortese, LCSW               Elenna Spratling G Norabelle Kondo, LCSW               Analisia Kingsford G Benito Lemmerman, LCSW               Shaquan Missey G Hayley Horn, LCSW               Kym Fenter G Armella Stogner,  LCSW               Holley Wirt G Falana Clagg, LCSW               Dimond Crotty G Jayin Derousse, LCSW

## 2021-07-29 ENCOUNTER — Ambulatory Visit: Payer: BC Managed Care – PPO | Admitting: Psychology

## 2021-07-29 DIAGNOSIS — Z634 Disappearance and death of family member: Secondary | ICD-10-CM

## 2021-07-29 DIAGNOSIS — F4321 Adjustment disorder with depressed mood: Secondary | ICD-10-CM

## 2021-07-29 DIAGNOSIS — F4310 Post-traumatic stress disorder, unspecified: Secondary | ICD-10-CM | POA: Diagnosis not present

## 2021-07-29 NOTE — Progress Notes (Signed)
[facility] Counselor/Therapist Progress Note  Patient ID: Heidi [name], MRN: [id number],    Date:  07/29/2021  Time Spent: 60 minutes  Treatment Type: Individual Therapy  Reported Symptoms: sadness, irritability,   Mental Status Exam: Appearance:  Casual     Behavior: Appropriate  Motor: Normal  Speech/Language:  Normal Rate  Affect:  blunted  Mood:  depressed  Thought process: normal  Thought content:   WNL  Sensory/Perceptual disturbances:   WNL  Orientation: oriented to person, place, time/date, and situation  Attention: Good  Concentration: Good  Memory: WNL  Fund of knowledge:  Good  Insight:   Good  Judgment:  Good  Impulse Control: Good   Risk Assessment: Danger to Self:  No Self-injurious Behavior: No Danger to Others: No Duty to Warn:no Physical Aggression / Violence:No  Access to Firearms a concern: No  Gang Involvement:No   Subjective: The patient attended a face to face individual therapy session in the office today.  The patient presents with a blunted affect and mood is depressed.  The patient reports that she has been struggling some more this week and feels that it is related to the grief over the loss of her son.  We talked about it being close to the anniversary date of his death and that likely she is struggling with grief.  She talked about feeling angry because he passed away and that is not fair that her family is having to deal with so much mental illness.  We talked about how to move through the grief and the anger and frustration.  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  I recommended that she make a list of things that she might be interested in doing so that she can have a better idea about what things to look at.  Will continue to provide supportive therapy and we will continue to talk about whether to go to every 2 weeks but it may be best  to wait until after his anniversary date of his death.  Interventions: Grief Therapy, Insight Oriented and CBT  Diagnosis:PTSD (post-traumatic stress disorder)  Grief at loss of child  Plan: Please see plan inClient Abilities/Strengths  Intelligent, ability for insight, motivated  Client Treatment Preferences  Outpatient Individual therapy  Client Statement of Needs  "I need some therapy to deal with my trauma and my grief"  Treatment Level  Outpatient Individual therapy  Symptoms  Experiences disturbances in sleep.: (Status: improved). Experiences  disturbing and persistent thoughts, images, and/or perceptions of the traumatic event.: (Status: improved). Has been exposed to a traumatic event involving actual or perceived  threat of death or serious injury.: (Status: maintained). Impairment in social,  occupational, or other areas of functioning.:  (Status: improved). Reports  difficulty concentrating as well as feelings of guilt.: (Status: improved).  Thoughts dominated by loss coupled with poor concentration, tearful spells, and confusion about the  future.: (Status: Improved).  Problems Addressed  Grief / Loss Unresolved, Posttraumatic Stress Disorder (PTSD)  Goals 1. Begin a healthy grieving process around the loss. Objective Begin verbalizing feelings associated with the loss. Target Date: 2021-09-23 Frequency: Weekly Progress: 50 Modality: individual Related Interventions 1. Assist the client in identifying and expressing feelings connected with his/her loss. Objective Tell in detail the story of the current loss that is triggering symptoms. Target Date: 2021-09-23 Frequency: Weekly Progress: 50 Modality: individual Related Interventions 1. Create a  safe environment for disclosure and actively build the level of trust with the client in  individual sessions through consistent eye contact, active listening, unconditional positive  regard, and warm acceptance to help  increase his/her ability to identify and express thoughts  and feelings. 2. Use empathy, compassion, and support, allowing the client to tell in detail the story of his/her  recent loss. 2. Returns to the level of psychological functioning prior to exposure to  the traumatic event. Objective Participate in Eye Movement Desensitization and Reprocessing (EMDR) to reduce emotional distress  related to traumatic thoughts, feelings, and images. Target Date: 2021-09-23 Frequency: Weekly Progress: 0 Modality: individual Related Interventions 1. Utilize Eye Movement Desensitization and Reprocessing (EMDR) to reduce the client's  emotional reactivity to the traumatic event and reduce PTSD symptoms. Objective Learn and implement guided self-dialogue to manage thoughts, feelings, and urges brought on by  encounters with trauma-related situations. Target Date: 2021-09-23 Frequency: Weekly Progress: 0 Modality: individual  Related Interventions 1. Teach the client a guided self-dialogue procedure in which he/she learns to recognize  maladaptive self-talk, challenges its biases, copes with engendered feelings, overcomes  avoidance, and reinforces his/her accomplishments; review and reinforce progress, problemsolve obstacles. Diagnosis Axis  F43.10 (Posttraumatic stress disorder) - Posttraumatic Stress  Disorder  Conditions For Discharge Achievement of treatment goals and objectives  The patient approved this plan and is currently maintaining progress.    Roshawn Ayala G Onnie Alatorre, LCSW                  Chick Cousins G Keaden Gunnoe, LCSW               Shelisha Gautier G Chayse Zatarain, LCSW               Laurrie Toppin G Josiel Gahm, LCSW               Aloma Boch G Diasia Henken, LCSW               Aston Lieske G Bertha Earwood, LCSW               Ido Wollman [employer], LCSW               Makaiya Geerdes [employer], LCSW               Jayvier Burgher [employer],  LCSW               Westlynn Fifer [employer], LCSW               Baraa Tubbs G Florida Nolton, LCSW               Demetri Kerman G Earlin Sweeden, LCSW               Jonnatan Hanners G Layza Summa, LCSW               Kimimila Tauzin G Deng Kemler, LCSW               Moneisha Vosler G Madalene Mickler, LCSW               Denarius Sesler G Guled Gahan, LCSW               Paarth Cropper G Kandise Riehle, LCSW               Natayah Warmack G Maripat Borba, LCSW               Amaryllis Malmquist G Ronny Ruddell, LCSW               Rivky Clendenning G Jaquail Mclees, LCSW  Gladys Deckard G Tifanie Gardiner, LCSW               Elaine Middleton G Nunzio Banet, LCSW               Makaley Storts G Nadeem Romanoski, LCSW               Anuel Sitter G Vickki Igou, LCSW               Padme Arriaga G Tyana Butzer, LCSW               Danniel Grenz G Konni Kesinger, LCSW

## 2021-08-05 ENCOUNTER — Ambulatory Visit: Payer: BC Managed Care – PPO | Admitting: Psychology

## 2021-08-05 DIAGNOSIS — F4321 Adjustment disorder with depressed mood: Secondary | ICD-10-CM | POA: Diagnosis not present

## 2021-08-05 DIAGNOSIS — Z634 Disappearance and death of family member: Secondary | ICD-10-CM

## 2021-08-05 DIAGNOSIS — F4310 Post-traumatic stress disorder, unspecified: Secondary | ICD-10-CM | POA: Diagnosis not present

## 2021-08-05 NOTE — Progress Notes (Signed)
[facility] Counselor/Therapist Progress Note  Patient ID: Heidi [name], MRN: [id number],    Date:  08/05/2021  Time Spent: 60 minutes  Treatment Type: Individual Therapy  Reported Symptoms: sadness, irritability,   Mental Status Exam: Appearance:  Casual     Behavior: Appropriate  Motor: Normal  Speech/Language:  Normal Rate  Affect:  blunted  Mood:  depressed  Thought process: normal  Thought content:   WNL  Sensory/Perceptual disturbances:   WNL  Orientation: oriented to person, place, time/date, and situation  Attention: Good  Concentration: Good  Memory: WNL  Fund of knowledge:  Good  Insight:   Good  Judgment:  Good  Impulse Control: Good   Risk Assessment: Danger to Self:  No Self-injurious Behavior: No Danger to Others: No Duty to Warn:no Physical Aggression / Violence:No  Access to Firearms a concern: No  Gang Involvement:No   Subjective: The patient attended a face to face individual therapy session in the office today.  The patient presents with a blunted affect and mood is depressed.  The patient reports that she has been thinking more about her son and the anniversary date of his death.  She states that she talked to her ex husband about doing some sort of gathering for her son in honor of him.  This seems to be a good therapeutic thing for them to do.  We talked about it being normal for her to be thinking about this now.  We also discussed some issues related to a career change.  We talked about the possibility of her going back to school because she is not wanting to return to her old job.  Talked about staying as positive as she can.  We will discuss moving to every other week when she figures out what her schedule is going to be.  Interventions: Grief Therapy, Insight Oriented and CBT  Diagnosis:PTSD (post-traumatic stress disorder)  Grief at loss of child  Plan: Please see plan inClient Abilities/Strengths  Intelligent, ability  for insight, motivated  Client Treatment Preferences  Outpatient Individual therapy  Client Statement of Needs  "I need some therapy to deal with my trauma and my grief"  Treatment Level  Outpatient Individual therapy  Symptoms  Experiences disturbances in sleep.: (Status: improved). Experiences  disturbing and persistent thoughts, images, and/or perceptions of the traumatic event.: (Status: improved). Has been exposed to a traumatic event involving actual or perceived  threat of death or serious injury.: (Status: maintained). Impairment in social,  occupational, or other areas of functioning.:  (Status: improved). Reports  difficulty concentrating as well as feelings of guilt.: (Status: improved).  Thoughts dominated by loss coupled with poor concentration, tearful spells, and confusion about the  future.: (Status: Improved).  Problems Addressed  Grief / Loss Unresolved, Posttraumatic Stress Disorder (PTSD)  Goals 1. Begin a healthy grieving process around the loss. Objective Begin verbalizing feelings associated with the loss. Target Date: 2021-09-23 Frequency: Weekly Progress: 50 Modality: individual Related Interventions 1. Assist the client in identifying and expressing feelings connected with his/her loss. Objective Tell in detail the story of the current loss that is triggering symptoms. Target Date: 2021-09-23 Frequency: Weekly Progress: 50 Modality: individual Related Interventions 1. Create a safe environment for disclosure and actively build the level of trust with the client in  individual sessions through consistent eye contact, active listening, unconditional positive  regard, and warm acceptance to help increase his/her ability to identify and express thoughts  and feelings. 2. Use empathy, compassion, and support,  allowing the client to tell in detail the story of his/her  recent loss. 2. Returns to the level of psychological functioning prior to exposure to  the  traumatic event. Objective Participate in Eye Movement Desensitization and Reprocessing (EMDR) to reduce emotional distress  related to traumatic thoughts, feelings, and images. Target Date: 2021-09-23 Frequency: Weekly Progress: 0 Modality: individual Related Interventions 1. Utilize Eye Movement Desensitization and Reprocessing (EMDR) to reduce the client's  emotional reactivity to the traumatic event and reduce PTSD symptoms. Objective Learn and implement guided self-dialogue to manage thoughts, feelings, and urges brought on by  encounters with trauma-related situations. Target Date: 2021-09-23 Frequency: Weekly Progress: 0 Modality: individual  Related Interventions 1. Teach the client a guided self-dialogue procedure in which he/she learns to recognize  maladaptive self-talk, challenges its biases, copes with engendered feelings, overcomes  avoidance, and reinforces his/her accomplishments; review and reinforce progress, problemsolve obstacles. Diagnosis Axis  F43.10 (Posttraumatic stress disorder) - Posttraumatic Stress  Disorder  Conditions For Discharge Achievement of treatment goals and objectives  The patient approved this plan and is currently maintaining progress.    Maruice Pieroni G Shaquala Broeker, LCSW                  Keymarion Bearman G Marvin Grabill, LCSW               Margree Gimbel G Coti Burd, LCSW               Gracyn Santillanes G Nishi Neiswonger, LCSW               Adolph Clutter G Atlas Kuc, LCSW               Alcario Tinkey [employer], LCSW               Tammee Thielke [employer], LCSW               Jhana Giarratano [employer], LCSW               Didier Brandenburg [employer], LCSW               Kaio Kuhlman [employer], LCSW               Lehman Whiteley [employer], LCSW               Zahniya Zellars [employer], LCSW               Cinzia Devos G Kila Godina, LCSW               Irania Durell G Misha Vanoverbeke, LCSW               Reyden Smith G Takeila Thayne,  LCSW               Donevan Biller G Dae Antonucci, LCSW               Shernita Rabinovich G Erricka Falkner, LCSW               Andre Gallego G Nico Rogness, LCSW               Wren Gallaga G Mahari Vankirk, LCSW               Bettyjean Stefanski G Bryley Kovacevic, LCSW               Debbrah Sampedro G Emannuel Vise, LCSW               Stanley Lyness G Jurnei Latini, LCSW               Kryssa Risenhoover G Aalliyah Kilker, LCSW  Keanu Frickey G Maylie Ashton, LCSW               Larah Kuntzman G Kadarius Cuffe, LCSW               Haven Pylant G Salam Chesterfield, LCSW               Keyri Salberg G Coriann Brouhard, LCSW

## 2021-08-10 ENCOUNTER — Other Ambulatory Visit: Payer: Self-pay | Admitting: Family Medicine

## 2021-08-10 DIAGNOSIS — G2581 Restless legs syndrome: Secondary | ICD-10-CM

## 2021-08-11 NOTE — Telephone Encounter (Signed)
Last refill: 04/09/2021 #60 with 3 refills  Last office visit:02/15/22023 NO future OV scheduled

## 2021-08-12 ENCOUNTER — Ambulatory Visit: Payer: BC Managed Care – PPO | Admitting: Psychology

## 2021-08-12 DIAGNOSIS — Z634 Disappearance and death of family member: Secondary | ICD-10-CM

## 2021-08-12 DIAGNOSIS — F4310 Post-traumatic stress disorder, unspecified: Secondary | ICD-10-CM

## 2021-08-12 DIAGNOSIS — F4321 Adjustment disorder with depressed mood: Secondary | ICD-10-CM

## 2021-08-12 NOTE — Progress Notes (Signed)
[facility] Counselor/Therapist Progress Note  Patient ID: Heidi [name], MRN: [id number],    Date:  08/12/2021  Time Spent: 60 minutes  Treatment Type: Individual Therapy  Reported Symptoms: sadness, irritability,   Mental Status Exam: Appearance:  Casual     Behavior: Appropriate  Motor: Normal  Speech/Language:  Normal Rate  Affect:  blunted  Mood:  depressed  Thought process: normal  Thought content:   WNL  Sensory/Perceptual disturbances:   WNL  Orientation: oriented to person, place, time/date, and situation  Attention: Good  Concentration: Good  Memory: WNL  Fund of knowledge:  Good  Insight:   Good  Judgment:  Good  Impulse Control: Good   Risk Assessment: Danger to Self:  No Self-injurious Behavior: No Danger to Others: No Duty to Warn:no Physical Aggression / Violence:No  Access to Firearms a concern: No  Gang Involvement:No   Subjective: The patient attended a face to face individual therapy session in the office today.  The patient presents with a blunted affect and mood is depressed.  The patient states that she is really having difficulty with the upcoming anniversary date of her son's death.  She reports that she had a really bad nightmare this past week and we processed that.  The patient talked about being anxious about going back to school and having to do some emotional exercises with the other teachers.  We talked about her possibly using FMLA to not have to participate and also to have a conversation with her principal about this.  I offered to write her a letter if necessary to help her with this process.  I explained to the patient that she is going to have to just move through the sadness and talked about it being a normal process.  Interventions: Grief Therapy, Insight Oriented and CBT  Diagnosis:PTSD (post-traumatic stress disorder)  Grief at loss of child  Plan: Please see plan inClient Abilities/Strengths  Intelligent,  ability for insight, motivated  Client Treatment Preferences  Outpatient Individual therapy  Client Statement of Needs  "I need some therapy to deal with my trauma and my grief"  Treatment Level  Outpatient Individual therapy  Symptoms  Experiences disturbances in sleep.: (Status: improved). Experiences  disturbing and persistent thoughts, images, and/or perceptions of the traumatic event.: (Status: improved). Has been exposed to a traumatic event involving actual or perceived  threat of death or serious injury.: (Status: maintained). Impairment in social,  occupational, or other areas of functioning.:  (Status: improved). Reports  difficulty concentrating as well as feelings of guilt.: (Status: improved).  Thoughts dominated by loss coupled with poor concentration, tearful spells, and confusion about the  future.: (Status: Improved).  Problems Addressed  Grief / Loss Unresolved, Posttraumatic Stress Disorder (PTSD)  Goals 1. Begin a healthy grieving process around the loss. Objective Begin verbalizing feelings associated with the loss. Target Date: 2021-09-23 Frequency: Weekly Progress: 50 Modality: individual Related Interventions 1. Assist the client in identifying and expressing feelings connected with his/her loss. Objective Tell in detail the story of the current loss that is triggering symptoms. Target Date: 2021-09-23 Frequency: Weekly Progress: 50 Modality: individual Related Interventions 1. Create a safe environment for disclosure and actively build the level of trust with the client in  individual sessions through consistent eye contact, active listening, unconditional positive  regard, and warm acceptance to help increase his/her ability to identify and express thoughts  and feelings. 2. Use empathy, compassion, and support, allowing the client to tell in detail the story  of his/her  recent loss. 2. Returns to the level of psychological functioning prior to exposure  to  the traumatic event. Objective Participate in Eye Movement Desensitization and Reprocessing (EMDR) to reduce emotional distress  related to traumatic thoughts, feelings, and images. Target Date: 2021-09-23 Frequency: Weekly Progress: 0 Modality: individual Related Interventions 1. Utilize Eye Movement Desensitization and Reprocessing (EMDR) to reduce the client's  emotional reactivity to the traumatic event and reduce PTSD symptoms. Objective Learn and implement guided self-dialogue to manage thoughts, feelings, and urges brought on by  encounters with trauma-related situations. Target Date: 2021-09-23 Frequency: Weekly Progress: 0 Modality: individual  Related Interventions 1. Teach the client a guided self-dialogue procedure in which he/she learns to recognize  maladaptive self-talk, challenges its biases, copes with engendered feelings, overcomes  avoidance, and reinforces his/her accomplishments; review and reinforce progress, problemsolve obstacles. Diagnosis Axis  F43.10 (Posttraumatic stress disorder) - Posttraumatic Stress  Disorder  Conditions For Discharge Achievement of treatment goals and objectives  The patient approved this plan and is currently maintaining progress.    Siham Bucaro G Burdette Gergely, LCSW                  Idalee Foxworthy G Remmington Urieta, LCSW               Holliday Sheaffer G Karas Pickerill, LCSW               Zimir Kittleson G Fionnuala Hemmerich, LCSW               Jyl Chico G Keon Benscoter, LCSW               Zira Helinski [employer], LCSW               Doss Cybulski [employer], LCSW               Lujuana Kapler [employer], LCSW               Alayjah Boehringer [employer], LCSW               Laveah Gloster [employer], LCSW               Indiyah Paone [employer], LCSW               Bernardino Dowell [employer], LCSW               Avaiah Stempel G Lisset Ketchem, LCSW               Ronan Duecker G Yoniel Arkwright, LCSW               Gadiel John G  Tramain Gershman, LCSW               Shayann Garbutt G Nydia Ytuarte, LCSW               Jazmarie Biever G Shealeigh Dunstan, LCSW               Jovana Rembold G Sheniya Garciaperez, LCSW               Nyheim Seufert G Ciearra Rufo, LCSW               Verlaine Embry G Haelie Clapp, LCSW               Tyrrell Stephens G Tamilyn Lupien, LCSW               Flossie Wexler G Annica Marinello, LCSW               Layani Foronda G Grier Vu, LCSW  Sherri Mcarthy G Andrew Soria, LCSW               Barbie Croston G Sevyn Markham, LCSW               Efrem Pitstick G Sidi Dzikowski, LCSW               Laneta Guerin G Navika Hoopes, LCSW               Gabrielle Mester G Mekaela Azizi, LCSW

## 2021-08-19 ENCOUNTER — Ambulatory Visit: Payer: BC Managed Care – PPO | Admitting: Psychology

## 2021-08-19 DIAGNOSIS — Z634 Disappearance and death of family member: Secondary | ICD-10-CM

## 2021-08-19 DIAGNOSIS — F4310 Post-traumatic stress disorder, unspecified: Secondary | ICD-10-CM

## 2021-08-19 DIAGNOSIS — F4321 Adjustment disorder with depressed mood: Secondary | ICD-10-CM

## 2021-08-20 NOTE — Progress Notes (Signed)
[facility] Counselor/Therapist Progress Note  Patient ID: Heidi [name], MRN: [id number],    Date:  08/19/2021  Time Spent: 60 minutes  Treatment Type: Individual Therapy  Reported Symptoms: sadness, irritability,   Mental Status Exam: Appearance:  Casual     Behavior: Appropriate  Motor: Normal  Speech/Language:  Normal Rate  Affect:  blunted  Mood:  depressed  Thought process: normal  Thought content:   WNL  Sensory/Perceptual disturbances:   WNL  Orientation: oriented to person, place, time/date, and situation  Attention: Good  Concentration: Good  Memory: WNL  Fund of knowledge:  Good  Insight:   Good  Judgment:  Good  Impulse Control: Good   Risk Assessment: Danger to Self:  No Self-injurious Behavior: No Danger to Others: No Duty to Warn:no Physical Aggression / Violence:No  Access to Firearms a concern: No  Gang Involvement:No   Subjective: The patient attended a face to face individual therapy session in the office today.  The patient seems a little less depressed than she was last session but she still presents with a blunted affect.  The patient reports that she feels a little bit better this week.  She reports that she went to a support group in [place] last week and she gained a different perspective about her loss.  Some of the people in that group had lost their children to murder and in some ways she was questioning her loss in regard to whether they thought that her son chose to do this because she was a bad parent.  I worked with patient to reframe this circumstance and to look at it differently and that it has not more or less significant the way you loose your child as opposed to just different.  I explained that they may have thought that her loss might have been more difficult because of feeling guilt about not being able to save him.  She seemed to be able to absorb this and will process over the next week.  Interventions: Grief  Therapy, Insight Oriented and CBT  Diagnosis:PTSD (post-traumatic stress disorder)  Grief at loss of child  Plan: Please see plan inClient Abilities/Strengths  Intelligent, ability for insight, motivated  Client Treatment Preferences  Outpatient Individual therapy  Client Statement of Needs  "I need some therapy to deal with my trauma and my grief"  Treatment Level  Outpatient Individual therapy  Symptoms  Experiences disturbances in sleep.: (Status: improved). Experiences  disturbing and persistent thoughts, images, and/or perceptions of the traumatic event.: (Status: improved). Has been exposed to a traumatic event involving actual or perceived  threat of death or serious injury.: (Status: maintained). Impairment in social,  occupational, or other areas of functioning.:  (Status: improved). Reports  difficulty concentrating as well as feelings of guilt.: (Status: improved).  Thoughts dominated by loss coupled with poor concentration, tearful spells, and confusion about the  future.: (Status: Improved).  Problems Addressed  Grief / Loss Unresolved, Posttraumatic Stress Disorder (PTSD)  Goals 1. Begin a healthy grieving process around the loss. Objective Begin verbalizing feelings associated with the loss. Target Date: 2021-09-23 Frequency: Weekly Progress: 50 Modality: individual Related Interventions 1. Assist the client in identifying and expressing feelings connected with his/her loss. Objective Tell in detail the story of the current loss that is triggering symptoms. Target Date: 2021-09-23 Frequency: Weekly Progress: 60 Modality: individual Related Interventions 1. Create a safe environment for disclosure and actively build the level of trust with the client in  individual sessions through  consistent eye contact, active listening, unconditional positive  regard, and warm acceptance to help increase his/her ability to identify and express thoughts  and feelings. 2. Use  empathy, compassion, and support, allowing the client to tell in detail the story of his/her  recent loss. 2. Returns to the level of psychological functioning prior to exposure to  the traumatic event. Objective Participate in Eye Movement Desensitization and Reprocessing (EMDR) to reduce emotional distress  related to traumatic thoughts, feelings, and images. Target Date: 2021-09-23 Frequency: Weekly Progress: 0 Modality: individual Related Interventions 1. Utilize Eye Movement Desensitization and Reprocessing (EMDR) to reduce the client's  emotional reactivity to the traumatic event and reduce PTSD symptoms. Objective Learn and implement guided self-dialogue to manage thoughts, feelings, and urges brought on by  encounters with trauma-related situations. Target Date: 2021-09-23 Frequency: Weekly Progress: 50 Modality: individual  Related Interventions 1. Teach the client a guided self-dialogue procedure in which he/she learns to recognize  maladaptive self-talk, challenges its biases, copes with engendered feelings, overcomes  avoidance, and reinforces his/her accomplishments; review and reinforce progress, problemsolve obstacles. Diagnosis Axis  F43.10 (Posttraumatic stress disorder) - Posttraumatic Stress  Disorder  Conditions For Discharge Achievement of treatment goals and objectives  The patient approved this plan and is currently maintaining progress.    Tolulope Pinkett G Maaz Spiering, LCSW                  Cindee Mclester G Gerrie Castiglia, LCSW               Adellyn Capek G Dreyah Montrose, LCSW               Brycin Kille G Gad Aymond, LCSW               Shalise Rosado G Shanea Karney, LCSW               Nafisa Olds G Kierrah Kilbride, LCSW               Noe Pittsley [employer], LCSW               Roisin Mones [employer], LCSW               Damani Kelemen [employer], LCSW               Dajai Wahlert [employer], LCSW               Antwoine Zorn [employer],  LCSW               Zenab Gronewold G Elin Fenley, LCSW               Gracelynn Bircher G Chevonne Bostrom, LCSW               Lorelei Heikkila G Raif Chachere, LCSW               Ciaran Begay G Sumayyah Custodio, LCSW               Jeovany Huitron G Baelyn Doring, LCSW               Westley Blass G Duke Weisensel, LCSW               Conroy Goracke G Lainee Lehrman, LCSW               Linden Mikes G Somaya Grassi, LCSW               Caidyn Henricksen G Javell Blackburn, LCSW               Antoinette Borgwardt G Rodolphe Edmonston, LCSW  Kevyn Boquet G Jalan Bodi, LCSW               Danylle Ouk G Kashtyn Jankowski, LCSW               Ola Raap G Phelicia Dantes, LCSW               Sheri Prows G Haniya Fern, LCSW               Crystallee Werden G Keryn Nessler, LCSW               Brylynn Hanssen G Naszir Cott, LCSW               Koji Niehoff G Ryanna Teschner, LCSW               Montee Tallman G Lashandra Arauz, LCSW

## 2021-08-26 ENCOUNTER — Ambulatory Visit (INDEPENDENT_AMBULATORY_CARE_PROVIDER_SITE_OTHER): Payer: BC Managed Care – PPO | Admitting: Psychology

## 2021-08-26 DIAGNOSIS — Z634 Disappearance and death of family member: Secondary | ICD-10-CM | POA: Diagnosis not present

## 2021-08-26 DIAGNOSIS — F4321 Adjustment disorder with depressed mood: Secondary | ICD-10-CM | POA: Diagnosis not present

## 2021-08-26 DIAGNOSIS — F4310 Post-traumatic stress disorder, unspecified: Secondary | ICD-10-CM | POA: Diagnosis not present

## 2021-08-27 NOTE — Progress Notes (Signed)
[facility] Counselor/Therapist Progress Note  Patient ID: Heidi [name], MRN: [id number],    Date:  08/26/2021  Time Spent: 60 minutes  Treatment Type: Individual Therapy  Reported Symptoms: sadness, irritability,   Mental Status Exam: Appearance:  Casual     Behavior: Appropriate  Motor: Normal  Speech/Language:  Normal Rate  Affect:  blunted  Mood:  depressed  Thought process: normal  Thought content:   WNL  Sensory/Perceptual disturbances:   WNL  Orientation: oriented to person, place, time/date, and situation  Attention: Good  Concentration: Good  Memory: WNL  Fund of knowledge:  Good  Insight:   Good  Judgment:  Good  Impulse Control: Good   Risk Assessment: Danger to Self:  No Self-injurious Behavior: No Danger to Others: No Duty to Warn:no Physical Aggression / Violence:No  Access to Firearms a concern: No  Gang Involvement:No   Subjective: The patient attended a face to face individual therapy session in the office today.  The patient presents as more frustrated and angry today.  Burgess Estelle was the anniversary date of her sons passing.  We talked about where her anger is directed and she is feeling very frustrated and angry with her employer at this point in time.  I am thinking that her anger is really more towards the fact that her son is deceased and she is struggling with that.  We talked about the memorial that she had for her son yesterday and she states that that went well.  We will continue to process her grief moving forward.  Interventions: Grief Therapy, Insight Oriented and CBT  Diagnosis:PTSD (post-traumatic stress disorder)  Grief at loss of child  Plan: Please see plan inClient Abilities/Strengths  Intelligent, ability for insight, motivated  Client Treatment Preferences  Outpatient Individual therapy  Client Statement of Needs  "I need some therapy to deal with my trauma and my grief"  Treatment Level  Outpatient  Individual therapy  Symptoms  Experiences disturbances in sleep.: (Status: improved). Experiences  disturbing and persistent thoughts, images, and/or perceptions of the traumatic event.: (Status: improved). Has been exposed to a traumatic event involving actual or perceived  threat of death or serious injury.: (Status: maintained). Impairment in social,  occupational, or other areas of functioning.:  (Status: improved). Reports  difficulty concentrating as well as feelings of guilt.: (Status: improved).  Thoughts dominated by loss coupled with poor concentration, tearful spells, and confusion about the  future.: (Status: Improved).  Problems Addressed  Grief / Loss Unresolved, Posttraumatic Stress Disorder (PTSD)  Goals 1. Begin a healthy grieving process around the loss. Objective Begin verbalizing feelings associated with the loss. Target Date: 2021-09-23 Frequency: Weekly Progress: 50 Modality: individual Related Interventions 1. Assist the client in identifying and expressing feelings connected with his/her loss. Objective Tell in detail the story of the current loss that is triggering symptoms. Target Date: 2021-09-23 Frequency: Weekly Progress: 60 Modality: individual Related Interventions 1. Create a safe environment for disclosure and actively build the level of trust with the client in  individual sessions through consistent eye contact, active listening, unconditional positive  regard, and warm acceptance to help increase his/her ability to identify and express thoughts  and feelings. 2. Use empathy, compassion, and support, allowing the client to tell in detail the story of his/her  recent loss. 2. Returns to the level of psychological functioning prior to exposure to  the traumatic event. Objective Participate in Eye Movement Desensitization and Reprocessing (EMDR) to reduce emotional distress  related to traumatic  thoughts, feelings, and images. Target Date: 2021-09-23  Frequency: Weekly Progress: 0 Modality: individual Related Interventions 1. Utilize Eye Movement Desensitization and Reprocessing (EMDR) to reduce the client's  emotional reactivity to the traumatic event and reduce PTSD symptoms. Objective Learn and implement guided self-dialogue to manage thoughts, feelings, and urges brought on by  encounters with trauma-related situations. Target Date: 2021-09-23 Frequency: Weekly Progress: 50 Modality: individual  Related Interventions 1. Teach the client a guided self-dialogue procedure in which he/she learns to recognize  maladaptive self-talk, challenges its biases, copes with engendered feelings, overcomes  avoidance, and reinforces his/her accomplishments; review and reinforce progress, problemsolve obstacles. Diagnosis Axis  F43.10 (Posttraumatic stress disorder) - Posttraumatic Stress  Disorder  Conditions For Discharge Achievement of treatment goals and objectives  The patient approved this plan and is currently maintaining progress.    Tiyonna Sardinha G Phelix Fudala, LCSW                  Mardee Clune G Imad Shostak, LCSW               Pietrina Jagodzinski G Jaymee Tilson, LCSW               Netta Fodge G Jaamal Farooqui, LCSW               Rekha Hobbins G Ayeza Therriault, LCSW               Helayna Dun [employer], LCSW               Tiena Manansala [employer], LCSW               Jaliyah Fotheringham [employer], LCSW               Armstrong Creasy [employer], LCSW               Dashia Caldeira [employer], LCSW               Dmarco Baldus [employer], LCSW               Lashawn Orrego [employer], LCSW               Cainen Burnham [employer], LCSW               Eufemio Strahm [employer], LCSW               Nyree Yonker G Oluwadamilare Tobler, LCSW               Wynn Kernes G Hassen Bruun, LCSW               Keilin Gamboa G Jazlynne Milliner, LCSW               Leander Tout G Owen Pratte, LCSW               Carmilla Granville G Zahria Ding,  LCSW               Canden Cieslinski G Mack Alvidrez, LCSW               Travante Knee G Rilea Arutyunyan, LCSW               Suleman Gunning G Dany Harten, LCSW               Adit Riddles G Zaidan Keeble, LCSW               Stacey Sago G Carrin Vannostrand, LCSW               Tishawn Friedhoff G Ahmia Colford, LCSW               Graden Hoshino G  Jaimya Feliciano, LCSW               Jakeel Starliper G Jayden Kratochvil, LCSW               Ashten Sarnowski G Cirilo Canner, LCSW               Lucia Mccreadie G Hilberto Burzynski, LCSW               Eliani Leclere G Oriel Ojo, LCSW

## 2021-09-02 ENCOUNTER — Ambulatory Visit: Payer: BC Managed Care – PPO | Admitting: Psychology

## 2021-09-02 DIAGNOSIS — F4321 Adjustment disorder with depressed mood: Secondary | ICD-10-CM

## 2021-09-02 DIAGNOSIS — F4310 Post-traumatic stress disorder, unspecified: Secondary | ICD-10-CM | POA: Diagnosis not present

## 2021-09-02 DIAGNOSIS — Z634 Disappearance and death of family member: Secondary | ICD-10-CM

## 2021-09-05 NOTE — Progress Notes (Signed)
[facility] Counselor/Therapist Progress Note  Patient ID: Heidi [name], MRN: [id number],    Date:  09/02/2021  Time Spent: 60 minutes  Treatment Type: Individual Therapy  Reported Symptoms: sadness, irritability,   Mental Status Exam: Appearance:  Casual     Behavior: Appropriate  Motor: Normal  Speech/Language:  Normal Rate  Affect:  blunted  Mood:  depressed  Thought process: normal  Thought content:   WNL  Sensory/Perceptual disturbances:   WNL  Orientation: oriented to person, place, time/date, and situation  Attention: Good  Concentration: Good  Memory: WNL  Fund of knowledge:  Good  Insight:   Good  Judgment:  Good  Impulse Control: Good   Risk Assessment: Danger to Self:  No Self-injurious Behavior: No Danger to Others: No Duty to Warn:no Physical Aggression / Violence:No  Access to Firearms a concern: No  Gang Involvement:No   Subjective: The patient attended a face to face individual therapy session in the office today.  The patient reports that she got the [facility] paperwork that I filled out for her.  She presents as pleasant and cooperative today.  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  She states that she is anxious about not being able to leave early so we have changed her appointments to 5:00 sessions.  The patient seemed relieved after we did this so she does not have to take time off of work unless absolutely necessary.  The patient seems to be handling the situation with her son's death and they had the celebration of his life on 16th of August.  The patient talked about really wanting to change her job and is anxious about going back to school and having the kids come back.  There is some concern because the [employer] schools has mold in it and it may be that they have just do virtual for a while until that is remediated.  We talked about the patient continuing to look for  jobs and she does not want to look for anything that she does not feel like she wants to stay at.  I encouraged her to continue to look while she is getting ready to go back to school.  Interventions: Grief Therapy, Insight Oriented and CBT  Diagnosis:PTSD (post-traumatic stress disorder)  Grief at loss of child  Plan: Please see plan inClient Abilities/Strengths  Intelligent, ability for insight, motivated  Client Treatment Preferences  Outpatient Individual therapy  Client Statement of Needs  "I need some therapy to deal with my trauma and my grief"  Treatment Level  Outpatient Individual therapy  Symptoms  Experiences disturbances in sleep.: (Status: improved). Experiences  disturbing and persistent thoughts, images, and/or perceptions of the traumatic event.: (Status: improved). Has been exposed to a traumatic event involving actual or perceived  threat of death or serious injury.: (Status: maintained). Impairment in social,  occupational, or other areas of functioning.:  (Status: improved). Reports  difficulty concentrating as well as feelings of guilt.: (Status: improved).  Thoughts dominated by loss coupled with poor concentration, tearful spells, and confusion about the  future.: (Status: Improved).  Problems Addressed  Grief / Loss Unresolved, Posttraumatic Stress Disorder (PTSD)  Goals 1. Begin a healthy grieving process around the loss. Objective Begin verbalizing feelings associated with the loss. Target Date: 2021-09-23 Frequency: Weekly Progress: 50 Modality: individual Related Interventions 1. Assist the client in identifying and expressing feelings connected with his/her loss. Objective Tell in detail the  story of the current loss that is triggering symptoms. Target Date: 2021-09-23 Frequency: Weekly Progress: 60 Modality: individual Related Interventions 1. Create a safe environment for disclosure and actively build the level of trust with the client in   individual sessions through consistent eye contact, active listening, unconditional positive  regard, and warm acceptance to help increase his/her ability to identify and express thoughts  and feelings. 2. Use empathy, compassion, and support, allowing the client to tell in detail the story of his/her  recent loss. 2. Returns to the level of psychological functioning prior to exposure to  the traumatic event. Objective Participate in Eye Movement Desensitization and Reprocessing (EMDR) to reduce emotional distress  related to traumatic thoughts, feelings, and images. Target Date: 2021-09-23 Frequency: Weekly Progress: 0 Modality: individual Related Interventions 1. Utilize Eye Movement Desensitization and Reprocessing (EMDR) to reduce the client's  emotional reactivity to the traumatic event and reduce PTSD symptoms. Objective Learn and implement guided self-dialogue to manage thoughts, feelings, and urges brought on by  encounters with trauma-related situations. Target Date: 2021-09-23 Frequency: Weekly Progress: 50 Modality: individual  Related Interventions 1. Teach the client a guided self-dialogue procedure in which he/she learns to recognize  maladaptive self-talk, challenges its biases, copes with engendered feelings, overcomes  avoidance, and reinforces his/her accomplishments; review and reinforce progress, problemsolve obstacles. Diagnosis Axis  F43.10 (Posttraumatic stress disorder) - Posttraumatic Stress  Disorder  Conditions For Discharge Achievement of treatment goals and objectives  The patient approved this plan and is currently maintaining progress.    Aniqua Briere G Otilia Kareem, LCSW                  Seretha Estabrooks G Vannak Montenegro, LCSW               Dorathea Faerber G Harper Vandervoort, LCSW               Tylar Merendino G Antionetta Ator, LCSW               Latoi Giraldo G Nyasia Baxley, LCSW               Selvin Yun G Marcelle Bebout, LCSW               Jaylaa Gallion [employer], LCSW               Briget Shaheed [employer], LCSW               Soriya Worster [employer], LCSW               Evalynne Locurto [employer], LCSW               Cedar Roseman G Ling Flesch, LCSW               Lavalle Skoda G Elianny Buxbaum, LCSW               Courteny Egler G Ewen Varnell, LCSW               Demetrius Mahler G Colleen Kotlarz, LCSW               Gabrianna Fassnacht G Karmela Bram, LCSW               Pratyush Ammon G Brodie Scovell, LCSW               Dajaun Goldring G Deidra Spease, LCSW               Mayline Dragon G Kannon Baum, LCSW               Kendle Turbin G Shamyah Stantz, LCSW  Damali Broadfoot G Immanuel Fedak, LCSW               Elvin Banker G Emmons Toth, LCSW               Cecilia Vancleve G Kinaya Hilliker, LCSW               Talene Glastetter G Romari Gasparro, LCSW               Leaner Morici G Josemanuel Eakins, LCSW               Mandeep Ferch G Madelene Kaatz, LCSW               Brynna Dobos G Miachel Nardelli, LCSW               Dhruti Ghuman G Jameel Quant, LCSW               Kindle Strohmeier G Leylah Tarnow, LCSW               Exilda Wilhite G Sanaiyah Kirchhoff, LCSW               Ahaana Rochette G Adair Lauderback, LCSW               Vernie Piet G Heidee Audi, LCSW

## 2021-09-08 ENCOUNTER — Ambulatory Visit: Payer: BC Managed Care – PPO | Admitting: Psychology

## 2021-09-08 DIAGNOSIS — Z634 Disappearance and death of family member: Secondary | ICD-10-CM

## 2021-09-08 DIAGNOSIS — F4310 Post-traumatic stress disorder, unspecified: Secondary | ICD-10-CM

## 2021-09-08 DIAGNOSIS — F4321 Adjustment disorder with depressed mood: Secondary | ICD-10-CM

## 2021-09-09 ENCOUNTER — Ambulatory Visit: Payer: BC Managed Care – PPO | Admitting: Psychology

## 2021-09-09 NOTE — Progress Notes (Signed)
[facility] Counselor/Therapist Progress Note  Patient ID: Heidi [name], MRN: [id number],    Date:  09/08/2021  Time Spent: 60 minutes  Treatment Type: Individual Therapy  Reported Symptoms: sadness, irritability,   Mental Status Exam: Appearance:  Casual     Behavior: Appropriate  Motor: Normal  Speech/Language:  Normal Rate  Affect:  blunted  Mood:  normal  Thought process: normal  Thought content:   WNL  Sensory/Perceptual disturbances:   WNL  Orientation: oriented to person, place, time/date, and situation  Attention: Good  Concentration: Good  Memory: WNL  Fund of knowledge:  Good  Insight:   Good  Judgment:  Good  Impulse Control: Good   Risk Assessment: Danger to Self:  No Self-injurious Behavior: No Danger to Others: No Duty to Warn:no Physical Aggression / Violence:No  Access to Firearms a concern: No  Gang Involvement:No   Subjective: The patient attended a face to face individual therapy session in the office today.  The patient presents with a blunted affect and mood is pleasant.  The patient reports that she is stressed about going back to school and they have been having some issues in her county with mold and she is not sure if she is going to be able to go back or not.  We talked about her stress level and we discussed ways that she can decrease her stress level.  Patient seems to be managing her situation with her loss of her son relatively well.  We also talked about her relationship and talked about how they interact.  The patient disclosed that she has some issues with some OCD type anxiety behavior but it has nothing to be too concerned about.  Interventions: Grief Therapy, Insight Oriented and CBT  Diagnosis:PTSD (post-traumatic stress disorder)  Grief at loss of child  Plan: Please see plan inClient Abilities/Strengths  Intelligent, ability for insight, motivated  Client Treatment Preferences  Outpatient Individual therapy   Client Statement of Needs  "I need some therapy to deal with my trauma and my grief"  Treatment Level  Outpatient Individual therapy  Symptoms  Experiences disturbances in sleep.: (Status: improved). Experiences  disturbing and persistent thoughts, images, and/or perceptions of the traumatic event.: (Status: improved). Has been exposed to a traumatic event involving actual or perceived  threat of death or serious injury.: (Status: maintained). Impairment in social,  occupational, or other areas of functioning.:  (Status: improved). Reports  difficulty concentrating as well as feelings of guilt.: (Status: improved).  Thoughts dominated by loss coupled with poor concentration, tearful spells, and confusion about the  future.: (Status: Improved).  Problems Addressed  Grief / Loss Unresolved, Posttraumatic Stress Disorder (PTSD)  Goals 1. Begin a healthy grieving process around the loss. Objective Begin verbalizing feelings associated with the loss. Target Date: 2021-09-23 Frequency: Weekly Progress: 50 Modality: individual Related Interventions 1. Assist the client in identifying and expressing feelings connected with his/her loss. Objective Tell in detail the story of the current loss that is triggering symptoms. Target Date: 2021-09-23 Frequency: Weekly Progress: 60 Modality: individual Related Interventions 1. Create a safe environment for disclosure and actively build the level of trust with the client in  individual sessions through consistent eye contact, active listening, unconditional positive  regard, and warm acceptance to help increase his/her ability to identify and express thoughts  and feelings. 2. Use empathy, compassion, and support, allowing the client to tell in detail the story of his/her  recent loss. 2. Returns to the level of psychological  functioning prior to exposure to  the traumatic event. Objective Participate in Eye Movement Desensitization and  Reprocessing (EMDR) to reduce emotional distress  related to traumatic thoughts, feelings, and images. Target Date: 2021-09-23 Frequency: Weekly Progress: 0 Modality: individual Related Interventions 1. Utilize Eye Movement Desensitization and Reprocessing (EMDR) to reduce the client's  emotional reactivity to the traumatic event and reduce PTSD symptoms. Objective Learn and implement guided self-dialogue to manage thoughts, feelings, and urges brought on by  encounters with trauma-related situations. Target Date: 2021-09-23 Frequency: Weekly Progress: 50 Modality: individual  Related Interventions 1. Teach the client a guided self-dialogue procedure in which he/she learns to recognize  maladaptive self-talk, challenges its biases, copes with engendered feelings, overcomes  avoidance, and reinforces his/her accomplishments; review and reinforce progress, problemsolve obstacles. Diagnosis Axis  F43.10 (Posttraumatic stress disorder) - Posttraumatic Stress  Disorder  Conditions For Discharge Achievement of treatment goals and objectives  The patient approved this plan and is currently maintaining progress.    Marzella Miracle G Yosef Krogh, LCSW                  Alessio Bogan G Azyah Flett, LCSW               Jazlynn Nemetz G Hakan Nudelman, LCSW               Ayeza Therriault G Anzlee Hinesley, LCSW               Lakeita Panther G Moustapha Tooker, LCSW               Jonda Alanis [employer], LCSW               Rc Amison [employer], LCSW               Nena Hampe [employer], LCSW               Emilygrace Grothe [employer], LCSW               Harlea Goetzinger [employer], LCSW               Salimatou Simone [employer], LCSW               Arnet Hofferber [employer], LCSW               Tashi Band G Caretha Rumbaugh, LCSW               Broady Lafoy G Audi Wettstein, LCSW               Aleera Gilcrease G Aceyn Kathol, LCSW               Skarleth Delmonico G Axil Copeman,  LCSW               Boby Eyer G Blayze Haen, LCSW               Jeana Kersting G Talor Cheema, LCSW               Kahla Risdon G Ellianne Gowen, LCSW               Arnette Driggs G Katye Valek, LCSW               Kristi Hyer G Tuwana Kapaun, LCSW               Domitila Stetler G Dariah Mcsorley, LCSW               Arvo Ealy G Quinzell Malcomb, LCSW               Halston Fairclough G Corbin Falck, LCSW  Jazilyn Siegenthaler G Heavenly Christine, LCSW               Jamese Trauger G Cicley Ganesh, LCSW               Shaia Porath G Fawnda Vitullo, LCSW               Sofya Moustafa G Pearlina Friedly, LCSW               Bobby Barton G Ytzel Gubler, LCSW               Zakayla Martinec G Marley Charlot, LCSW               Morgana Rowley G Shravya Wickwire, LCSW               Chatham Howington G Melissa Tomaselli, LCSW

## 2021-09-11 ENCOUNTER — Encounter: Payer: Self-pay | Admitting: Family Medicine

## 2021-09-11 DIAGNOSIS — T7840XA Allergy, unspecified, initial encounter: Secondary | ICD-10-CM

## 2021-09-15 ENCOUNTER — Ambulatory Visit: Payer: BC Managed Care – PPO | Admitting: Psychology

## 2021-09-15 DIAGNOSIS — F4310 Post-traumatic stress disorder, unspecified: Secondary | ICD-10-CM | POA: Diagnosis not present

## 2021-09-15 DIAGNOSIS — Z634 Disappearance and death of family member: Secondary | ICD-10-CM

## 2021-09-15 DIAGNOSIS — F4321 Adjustment disorder with depressed mood: Secondary | ICD-10-CM | POA: Diagnosis not present

## 2021-09-15 NOTE — Progress Notes (Signed)
Behavioral Health Counselor/Therapist Progress Note  Patient ID: Heidi [name], MRN: [id number],    Date:  09/15/2021  Time Spent: 60 minutes  Treatment Type: Individual Therapy  Reported Symptoms: sadness, irritability,   Mental Status Exam: Appearance:  Casual     Behavior: Appropriate  Motor: Normal  Speech/Language:  Normal Rate  Affect:  blunted  Mood:  normal  Thought process: normal  Thought content:   WNL  Sensory/Perceptual disturbances:   WNL  Orientation: oriented to person, place, time/date, and situation  Attention: Good  Concentration: Good  Memory: WNL  Fund of knowledge:  Good  Insight:   Good  Judgment:  Good  Impulse Control: Good   Risk Assessment: Danger to Self:  No Self-injurious Behavior: No Danger to Others: No Duty to Warn:no Physical Aggression / Violence:No  Access to Firearms a concern: No  Gang Involvement:No   Subjective: The patient attended a face to face individual therapy session in the office today.  The patient presents with a blunted affect and mood is pleasant.  The patient reports that she continues to be stressed about school and going back to school.  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  We talked about her applying for other jobs and I encouraged her to continue to look since she is not very happy where she is.  We talked about what she is doing for herself and she is painting rocks and going to put those around town for suicide prevention month.  She reports that she feels good about being able to do this and feels like it would be some way to honor her son and helpful.  The patient seems to be managing okay and I encouraged her to continue to do things that she enjoys that does honor her son and his legacy.  Interventions: Grief Therapy, Insight Oriented and CBT  Diagnosis:No diagnosis found.  Plan: Please see plan  inClient Abilities/Strengths  Intelligent, ability for insight, motivated  Client Treatment Preferences  Outpatient Individual therapy  Client Statement of Needs  "I need some therapy to deal with my trauma and my grief"  Treatment Level  Outpatient Individual therapy  Symptoms  Experiences disturbances in sleep.: (Status: improved). Experiences  disturbing and persistent thoughts, images, and/or perceptions of the traumatic event.: (Status: improved). Has been exposed to a traumatic event involving actual or perceived  threat of death or serious injury.: (Status: maintained). Impairment in social,  occupational, or other areas of functioning.:  (Status: improved). Reports  difficulty concentrating as well as feelings of guilt.: (Status: improved).  Thoughts dominated by loss coupled with poor concentration, tearful spells, and confusion about the  future.: (Status: Improved).  Problems Addressed  Grief / Loss Unresolved, Posttraumatic Stress Disorder (PTSD)  Goals 1. Begin a healthy grieving process around the loss. Objective Begin verbalizing feelings associated with the loss. Target Date: 2021-09-23 Frequency: Weekly Progress: 50 Modality: individual Related Interventions 1. Assist the client in identifying and expressing feelings connected with his/her loss. Objective Tell in detail the story of the current loss that is triggering symptoms. Target Date: 2021-09-23 Frequency: Weekly Progress: 60 Modality: individual Related Interventions 1. Create a safe environment for disclosure and actively build the level of trust with the client in  individual sessions through consistent eye contact, active listening, unconditional positive  regard, and warm acceptance to help increase his/her ability to identify  and express thoughts  and feelings. 2. Use empathy, compassion, and support, allowing the client to tell in detail the story of his/her  recent loss. 2. Returns to the level of  psychological functioning prior to exposure to  the traumatic event. Objective Participate in Eye Movement Desensitization and Reprocessing (EMDR) to reduce emotional distress  related to traumatic thoughts, feelings, and images. Target Date: 2021-09-23 Frequency: Weekly Progress: 0 Modality: individual Related Interventions 1. Utilize Eye Movement Desensitization and Reprocessing (EMDR) to reduce the client's  emotional reactivity to the traumatic event and reduce PTSD symptoms. Objective Learn and implement guided self-dialogue to manage thoughts, feelings, and urges brought on by  encounters with trauma-related situations. Target Date: 2021-09-23 Frequency: Weekly Progress: 50 Modality: individual  Related Interventions 1. Teach the client a guided self-dialogue procedure in which he/she learns to recognize  maladaptive self-talk, challenges its biases, copes with engendered feelings, overcomes  avoidance, and reinforces his/her accomplishments; review and reinforce progress, problemsolve obstacles. Diagnosis Axis  F43.10 (Posttraumatic stress disorder) - Posttraumatic Stress  Disorder  Conditions For Discharge Achievement of treatment goals and objectives  The patient approved this plan and is currently maintaining progress.    Heidi Vejar G Viyaan Champine,  LCSW  Heidi Malta G Ramina Hulet, LCSW

## 2021-09-16 ENCOUNTER — Ambulatory Visit: Payer: BC Managed Care – PPO | Admitting: Psychology

## 2021-09-20 NOTE — Addendum Note (Signed)
Addended by: Mila Merry E on: 09/20/2021 01:17 PM   Modules accepted: Orders

## 2021-09-22 ENCOUNTER — Ambulatory Visit: Payer: BC Managed Care – PPO | Admitting: Psychology

## 2021-09-22 DIAGNOSIS — Z634 Disappearance and death of family member: Secondary | ICD-10-CM

## 2021-09-22 DIAGNOSIS — F4310 Post-traumatic stress disorder, unspecified: Secondary | ICD-10-CM | POA: Diagnosis not present

## 2021-09-22 DIAGNOSIS — F4321 Adjustment disorder with depressed mood: Secondary | ICD-10-CM

## 2021-09-23 ENCOUNTER — Ambulatory Visit: Payer: BC Managed Care – PPO | Admitting: Psychology

## 2021-09-23 LAB — ALLERGEN PROFILE, MOLD
Alternaria Alternata IgE: <0.1 kU/L
Aspergillus Fumigatus IgE: <0.1 kU/L
Aureobasidi Pullulans IgE: <0.1 kU/L
Candida Albicans IgE: <0.1 kU/L
Cladosporium Herbarum IgE: <0.1 kU/L
M009-IgE Fusarium proliferatum: <0.1 kU/L
M014-IgE Epicoccum purpur: <0.1 kU/L
Mucor Racemosus IgE: <0.1 kU/L
Penicillium Chrysogen IgE: <0.1 kU/L
Phoma Betae IgE: <0.1 kU/L
Setomelanomma Rostrat: <0.1 kU/L
Stemphylium Herbarum IgE: <0.1 kU/L

## 2021-09-23 NOTE — Progress Notes (Signed)
[facility] Counselor/Therapist Progress Note  Patient ID: Tangala Wiegert, MRN: [id number],    Date:  09/22/2021  Time Spent: 60 minutes  Treatment Type: Individual Therapy  Reported Symptoms: sadness, irritability,   Mental Status Exam: Appearance:  Casual     Behavior: Appropriate  Motor: Normal  Speech/Language:  Normal Rate  Affect:  blunted  Mood:  normal  Thought process: normal  Thought content:   WNL  Sensory/Perceptual disturbances:   WNL  Orientation: oriented to person, place, time/date, and situation  Attention: Good  Concentration: Good  Memory: WNL  Fund of knowledge:  Good  Insight:   Good  Judgment:  Good  Impulse Control: Good   Risk Assessment: Danger to Self:  No Self-injurious Behavior: No Danger to Others: No Duty to Warn:no Physical Aggression / Violence:No  Access to Firearms a concern: No  Gang Involvement:No   Subjective: The patient attended a face to face individual therapy session in the office today.  The patient presents with a blunted affect and mood is pleasant.  The patient says that they have started school back.  She reports that she is very tired.  She states that she is still looking for employment somewhere else as she does not feel that she can do this anymore.  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  She explained what had happened with this relationship years and years ago and she seems to be in a good head space about it.  The patient seems to be managing her grief well and I provided cognitive behavioral therapy as well as supportive interventions today. Interventions: Grief Therapy, Insight Oriented and CBT  Diagnosis:PTSD (post-traumatic stress disorder)  Grief at loss of child  Plan: Please see plan inClient Abilities/Strengths  Intelligent, ability for insight, motivated  Client  Treatment Preferences  Outpatient Individual therapy  Client Statement of Needs  "I need some therapy to deal with my trauma and my grief"  Treatment Level  Outpatient Individual therapy  Symptoms  Experiences disturbances in sleep.: (Status: improved). Experiences  disturbing and persistent thoughts, images, and/or perceptions of the traumatic event.: (Status: improved). Has been exposed to a traumatic event involving actual or perceived  threat of death or serious injury.: (Status: maintained). Impairment in social,  occupational, or other areas of functioning.:  (Status: improved). Reports  difficulty concentrating as well as feelings of guilt.: (Status: improved).  Thoughts dominated by loss coupled with poor concentration, tearful spells, and confusion about the  future.: (Status: Improved).  Problems Addressed  Grief / Loss Unresolved, Posttraumatic Stress Disorder (PTSD)  Goals 1. Begin a healthy grieving process around the loss. Objective Begin verbalizing feelings associated with the loss. Target Date: 2022-09-24 Frequency: Weekly Progress: 50 Modality: individual Related Interventions 1. Assist the client in identifying and expressing feelings connected with his/her loss. Objective Tell in detail the story of the current loss that is triggering symptoms. Target Date: 2022-09-24 Frequency: Weekly Progress: 60 Modality: individual Related Interventions 1. Create a safe environment for disclosure and actively build the level of trust with the client in  individual sessions through consistent eye contact, active listening, unconditional positive  regard, and warm acceptance to help increase his/her ability to identify and express thoughts  and feelings. 2. Use empathy, compassion, and support, allowing the client to tell in detail the story of his/her  recent loss.  2. Returns to the level of psychological functioning prior to exposure to  the traumatic  event. Objective Participate in Eye Movement Desensitization and Reprocessing (EMDR) to reduce emotional distress  related to traumatic thoughts, feelings, and images. Target Date: 2022-09-24 Frequency: Weekly Progress: 0 Modality: individual Related Interventions 1. Utilize Eye Movement Desensitization and Reprocessing (EMDR) to reduce the client's  emotional reactivity to the traumatic event and reduce PTSD symptoms. Objective Learn and implement guided self-dialogue to manage thoughts, feelings, and urges brought on by  encounters with trauma-related situations. Target Date: 2022-09-24 Frequency: Weekly Progress: 50 Modality: individual  Related Interventions 1. Teach the client a guided self-dialogue procedure in which he/she learns to recognize  maladaptive self-talk, challenges its biases, copes with engendered feelings, overcomes  avoidance, and reinforces his/her accomplishments; review and reinforce progress, problemsolve obstacles. Diagnosis Axis  F43.10 (Posttraumatic stress disorder) - Posttraumatic Stress  Disorder  Conditions For Discharge Achievement of treatment goals and objectives  The patient approved this plan and is currently maintaining progress.    Jaterrius Ricketson G Kindle Strohmeier, LCSW

## 2021-09-29 ENCOUNTER — Ambulatory Visit: Payer: BC Managed Care – PPO | Admitting: Psychology

## 2021-09-29 DIAGNOSIS — Z634 Disappearance and death of family member: Secondary | ICD-10-CM | POA: Diagnosis not present

## 2021-09-29 DIAGNOSIS — F4310 Post-traumatic stress disorder, unspecified: Secondary | ICD-10-CM

## 2021-09-29 DIAGNOSIS — F4321 Adjustment disorder with depressed mood: Secondary | ICD-10-CM | POA: Diagnosis not present

## 2021-09-30 ENCOUNTER — Ambulatory Visit: Payer: BC Managed Care – PPO | Admitting: Psychology

## 2021-09-30 NOTE — Progress Notes (Signed)
[facility] Counselor/Therapist Progress Note  Patient ID: Heidi [name], MRN: [id number],    Date:  09/29/2021  Time Spent: 60 minutes  Treatment Type: Individual Therapy  Reported Symptoms: sadness, irritability,   Mental Status Exam: Appearance:  Casual     Behavior: Appropriate  Motor: Normal  Speech/Language:  Normal Rate  Affect:  blunted  Mood:  normal  Thought process: normal  Thought content:   WNL  Sensory/Perceptual disturbances:   WNL  Orientation: oriented to person, place, time/date, and situation  Attention: Good  Concentration: Good  Memory: WNL  Fund of knowledge:  Good  Insight:   Good  Judgment:  Good  Impulse Control: Good   Risk Assessment: Danger to Self:  No Self-injurious Behavior: No Danger to Others: No Duty to Warn:no Physical Aggression / Violence:No  Access to Firearms a concern: No  Gang Involvement:No   Subjective: The patient attended a face to face individual therapy session in the office today.  The patient presents with a blunted affect and mood is pleasant.  The patient states that she is doing okay.  She talked about having some issues with flashbacks over the last few weeks.  She reports that they are not on a regular basis and we briefly talked about EMDR, but she seems to be a little afraid of doing EMDR.  We discussed how she is handling things and it seems that she is trying to be more positive and distract herself when she has an issue.  The patient seems to be managing things well  She has to deal with her son's birthday coming up in October.  The patient is handling her grief well.   Interventions: Grief Therapy, Insight Oriented and CBT  Diagnosis:No diagnosis found.  Plan: Please see plan inClient Abilities/Strengths  Intelligent, ability for insight, motivated  Client Treatment Preferences  Outpatient Individual therapy  Client Statement of Needs  "I need some therapy to deal with my trauma and my  grief"  Treatment Level  Outpatient Individual therapy  Symptoms  Experiences disturbances in sleep.: (Status: improved). Experiences  disturbing and persistent thoughts, images, and/or perceptions of the traumatic event.: (Status: improved). Has been exposed to a traumatic event involving actual or perceived  threat of death or serious injury.: (Status: maintained). Impairment in social,  occupational, or other areas of functioning.:  (Status: improved). Reports  difficulty concentrating as well as feelings of guilt.: (Status: improved).  Thoughts dominated by loss coupled with poor concentration, tearful spells, and confusion about the  future.: (Status: Improved).  Problems Addressed  Grief / Loss Unresolved, Posttraumatic Stress Disorder (PTSD)  Goals 1. Begin a healthy grieving process around the loss. Objective Begin verbalizing feelings associated with the loss. Target Date: 2022-09-24 Frequency: Weekly Progress: 50 Modality: individual Related Interventions 1. Assist the client in identifying and expressing feelings connected with his/her loss. Objective Tell in detail the story of the current loss that is triggering symptoms. Target Date: 2022-09-24 Frequency: Weekly Progress: 60 Modality: individual Related Interventions 1. Create a safe environment for disclosure and actively build the level of trust with the client in  individual sessions through consistent eye contact, active listening, unconditional positive  regard, and warm acceptance to help increase his/her ability to identify and express thoughts  and feelings. 2. Use empathy, compassion, and support, allowing the client to tell in detail the story of his/her  recent loss. 2. Returns to the level of psychological functioning prior to exposure to  the traumatic event. Objective Participate  in Eye Movement Desensitization and Reprocessing (EMDR) to reduce emotional distress  related to traumatic thoughts,  feelings, and images. Target Date: 2022-09-24 Frequency: Weekly Progress: 0 Modality: individual Related Interventions 1. Utilize Eye Movement Desensitization and Reprocessing (EMDR) to reduce the client's  emotional reactivity to the traumatic event and reduce PTSD symptoms. Objective Learn and implement guided self-dialogue to manage thoughts, feelings, and urges brought on by  encounters with trauma-related situations. Target Date: 2022-09-24 Frequency: Weekly Progress: 50 Modality: individual  Related Interventions 1. Teach the client a guided self-dialogue procedure in which he/she learns to recognize  maladaptive self-talk, challenges its biases, copes with engendered feelings, overcomes  avoidance, and reinforces his/her accomplishments; review and reinforce progress, problemsolve obstacles. Diagnosis Axis  F43.10 (Posttraumatic stress disorder) - Posttraumatic Stress  Disorder  Conditions For Discharge Achievement of treatment goals and objectives  The patient approved this plan and is currently maintaining progress.    Styles Fambro G Amogh Komatsu, LCSW

## 2021-10-06 ENCOUNTER — Other Ambulatory Visit: Payer: Self-pay | Admitting: Family Medicine

## 2021-10-06 DIAGNOSIS — M359 Systemic involvement of connective tissue, unspecified: Secondary | ICD-10-CM

## 2021-10-07 ENCOUNTER — Ambulatory Visit: Payer: BC Managed Care – PPO | Admitting: Psychology

## 2021-10-14 ENCOUNTER — Ambulatory Visit: Payer: BC Managed Care – PPO | Admitting: Psychology

## 2021-10-16 ENCOUNTER — Other Ambulatory Visit: Payer: Self-pay

## 2021-10-16 ENCOUNTER — Encounter: Payer: Self-pay | Admitting: Emergency Medicine

## 2021-10-16 DIAGNOSIS — D72829 Elevated white blood cell count, unspecified: Secondary | ICD-10-CM | POA: Diagnosis not present

## 2021-10-16 DIAGNOSIS — R824 Acetonuria: Secondary | ICD-10-CM | POA: Diagnosis not present

## 2021-10-16 DIAGNOSIS — N83202 Unspecified ovarian cyst, left side: Secondary | ICD-10-CM | POA: Insufficient documentation

## 2021-10-16 DIAGNOSIS — R102 Pelvic and perineal pain: Secondary | ICD-10-CM | POA: Diagnosis present

## 2021-10-16 DIAGNOSIS — K661 Hemoperitoneum: Secondary | ICD-10-CM | POA: Diagnosis not present

## 2021-10-16 LAB — COMPREHENSIVE METABOLIC PANEL
ALT: 9 U/L (ref 0–44)
AST: 14 U/L — ABNORMAL LOW (ref 15–41)
Albumin: 4.2 g/dL (ref 3.5–5.0)
Alkaline Phosphatase: 49 U/L (ref 38–126)
Anion gap: 8 (ref 5–15)
BUN: 11 mg/dL (ref 6–20)
CO2: 24 mmol/L (ref 22–32)
Calcium: 9.1 mg/dL (ref 8.9–10.3)
Chloride: 104 mmol/L (ref 98–111)
Creatinine, Ser: 0.74 mg/dL (ref 0.44–1.00)
GFR, Estimated: >60 mL/min (ref >60)
Glucose, Bld: 120 mg/dL — ABNORMAL HIGH (ref 70–99)
Potassium: 4 mmol/L (ref 3.5–5.1)
Sodium: 136 mmol/L (ref 135–145)
Total Bilirubin: 0.9 mg/dL (ref 0.3–1.2)
Total Protein: 7.5 g/dL (ref 6.5–8.1)

## 2021-10-16 LAB — CBC
HCT: 38.9 % (ref 36.0–46.0)
Hemoglobin: 13 g/dL (ref 12.0–15.0)
MCH: 33.2 pg (ref 26.0–34.0)
MCHC: 33.4 g/dL (ref 30.0–36.0)
MCV: 99.5 fL (ref 80.0–100.0)
Platelets: 294 10*3/uL (ref 150–400)
RBC: 3.91 MIL/uL (ref 3.87–5.11)
RDW: 12.4 % (ref 11.5–15.5)
WBC: 12.7 10*3/uL — ABNORMAL HIGH (ref 4.0–10.5)
nRBC: 0 % (ref 0.0–0.2)

## 2021-10-16 NOTE — ED Triage Notes (Signed)
Pt reports approximately 30 minutes after having sex she developed severe lower abdominal pain pointing to pelvic area. Pt reports she was crawled in fetal position unable to move due to pain. Pt reports every time she moves pain increases. Pt talks in complete sentences no respiratory distress noted

## 2021-10-17 ENCOUNTER — Emergency Department
Admission: EM | Admit: 2021-10-17 | Discharge: 2021-10-17 | Disposition: A | Discharge status: Home / Self Care | Payer: BC Managed Care – PPO | Attending: Emergency Medicine | Admitting: Emergency Medicine

## 2021-10-17 ENCOUNTER — Emergency Department: Payer: BC Managed Care – PPO

## 2021-10-17 DIAGNOSIS — R102 Pelvic and perineal pain: Secondary | ICD-10-CM

## 2021-10-17 DIAGNOSIS — K661 Hemoperitoneum: Secondary | ICD-10-CM

## 2021-10-17 DIAGNOSIS — N83202 Unspecified ovarian cyst, left side: Secondary | ICD-10-CM

## 2021-10-17 LAB — URINALYSIS, ROUTINE W REFLEX MICROSCOPIC
Bacteria, UA: NONE SEEN
Bilirubin Urine: NEGATIVE
Glucose, UA: NEGATIVE mg/dL
Ketones, ur: 20 mg/dL — AB
Leukocytes,Ua: NEGATIVE
Nitrite: NEGATIVE
Protein, ur: NEGATIVE mg/dL
Specific Gravity, Urine: 1.009 (ref 1.005–1.030)
pH: 6 (ref 5.0–8.0)

## 2021-10-17 LAB — PREGNANCY, URINE: Preg Test, Ur: NEGATIVE

## 2021-10-17 MED ORDER — IOHEXOL 300 MG/ML  SOLN
100.0000 mL | Freq: Once | INTRAMUSCULAR | Status: AC | PRN
  Administered 2021-10-17: 100 mL via INTRAVENOUS

## 2021-10-17 MED ORDER — ACETAMINOPHEN 500 MG PO TABS
1000.0000 mg | ORAL_TABLET | Freq: Once | ORAL | Status: AC
  Administered 2021-10-17: 1000 mg via ORAL
  Filled 2021-10-17: qty 2

## 2021-10-17 MED ORDER — LACTATED RINGERS IV BOLUS
1000.0000 mL | Freq: Once | INTRAVENOUS | Status: AC
Start: 2021-10-17 — End: 2021-10-17
  Administered 2021-10-17: 1000 mL via INTRAVENOUS

## 2021-10-17 MED ORDER — NAPROXEN 500 MG PO TABS
500.0000 mg | ORAL_TABLET | Freq: Once | ORAL | Status: AC
  Administered 2021-10-17: 500 mg via ORAL
  Filled 2021-10-17: qty 1

## 2021-10-17 NOTE — ED Provider Notes (Signed)
[facility] Provider Note    Event Date/Time   First MD Initiated Contact with Patient 10/17/21 0109     (approximate)   History   Pelvic Pain   HPI  Heidi [name] is a 46 y.o. female who presents to the ED for evaluation of Pelvic Pain   Patient presents with her fianc for evaluation of postcoital pelvic pain.  She has been in her typical state of health until developing this pain shortly after intercourse.  She reports vaginal intercourse without any toys or foreign body or objects beyond "normal" intercourse with her fianc.  No pain during intercourse, but shortly after he ejaculated in a finished she reports severe lower pelvic pain extending bilaterally from the midline.  Associated nausea with the severe pain, but no actual emesis, stool changes.  No postcoital bleeding or vaginal discharge.  No dysuria.  Physical Exam   Triage Vital Signs: ED Triage Vitals  Enc Vitals Group     BP 10/16/21 2321 114/71     Pulse Rate 10/16/21 2321 79     Resp 10/16/21 2321 20     Temp 10/16/21 2321 98 F (36.7 C)     Temp Source 10/16/21 2321 Oral     SpO2 10/16/21 2321 100 %     Weight 10/16/21 2323 173 lb (78.5 kg)     Height 10/16/21 2323 5\' 8"  (1.727 m)     Head Circumference --      Peak Flow --      Pain Score 10/16/21 2322 8     Pain Loc --      Pain Edu? --      Excl. in GC? --     Most recent vital signs: Vitals:   10/16/21 2321 10/17/21 0351  BP: 114/71 100/63  Pulse: 79 64  Resp: 20 18  Temp: 98 F (36.7 C)   SpO2: 100% 100%    General: Awake, no distress.  CV:  Good peripheral perfusion.  Resp:  Normal effort.  Abd:  No distention.  Mild and poorly localizing lower abdominal tenderness to palpation.  Benign upper abdomen.  No peritoneal features. MSK:  No deformity noted.  Neuro:  No focal deficits appreciated. Other:     ED Results / Procedures / Treatments   Labs (all labs ordered are listed, but only abnormal results  are displayed) Labs Reviewed  URINALYSIS, ROUTINE W REFLEX MICROSCOPIC - Abnormal; Notable for the following components:      Result Value   Color, Urine YELLOW (*)    APPearance CLEAR (*)    Hgb urine dipstick SMALL (*)    Ketones, ur 20 (*)    All other components within normal limits  CBC - Abnormal; Notable for the following components:   WBC 12.7 (*)    All other components within normal limits  COMPREHENSIVE METABOLIC PANEL - Abnormal; Notable for the following components:   Glucose, Bld 120 (*)    AST 14 (*)    All other components within normal limits  PREGNANCY, URINE    EKG   RADIOLOGY CT abdomen/pelvis interpreted by me with small volume hemoperitoneum and a left ovarian cyst  Official radiology report(s): CT ABDOMEN PELVIS W CONTRAST  Result Date: 10/17/2021 CLINICAL DATA:  Post coital lower abdominal pain EXAM: CT ABDOMEN AND PELVIS WITH CONTRAST TECHNIQUE: Multidetector CT imaging of the abdomen and pelvis was performed using the standard protocol following bolus administration of intravenous contrast. RADIATION DOSE REDUCTION: This exam was  performed according to the departmental dose-optimization program which includes automated exposure control, adjustment of the mA and/or kV according to patient size and/or use of iterative reconstruction technique. CONTRAST:  118mL OMNIPAQUE IOHEXOL 300 MG/ML  SOLN COMPARISON:  06/04/2020 FINDINGS: Lower chest:  No contributory findings. Hepatobiliary: No focal liver abnormality.No evidence of biliary obstruction or stone. Pancreas: Unremarkable. Spleen: Unremarkable. Adrenals/Urinary Tract: Negative adrenals. No hydronephrosis or stone. Unremarkable bladder. Stomach/Bowel:  No obstruction. No appendicitis. Vascular/Lymphatic: No acute vascular abnormality. No mass or adenopathy. Reproductive:Left ovarian cyst measuring 3.9 cm, complicated by layering high-density. There is been prior tubal ligation. Normal appearance of the left ovary  on prior. Other: Small volume pelvic fluid which is both low and high density. Musculoskeletal: No acute abnormalities. 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 IMPRESSION: Small volume pelvic hemoperitoneum likely arising from a 3.9 cm left ovarian cyst that appears hemorrhagic. Electronically Signed   By: Jorje Guild M.D.   On: 10/17/2021 04:41    PROCEDURES and INTERVENTIONS:  Procedures  Medications  acetaminophen (TYLENOL) tablet 1,000 mg (1,000 mg Oral Given 10/17/21 0209)  naproxen (NAPROSYN) tablet 500 mg (500 mg Oral Given 10/17/21 0208)  lactated ringers bolus 1,000 mL (0 mLs Intravenous Stopped 10/17/21 0451)  iohexol (OMNIPAQUE) 300 MG/ML solution 100 mL (100 mLs Intravenous Contrast Given 10/17/21 0417)     IMPRESSION / MDM / ASSESSMENT AND PLAN / ED COURSE  I reviewed the triage vital signs and the nursing notes.  Differential diagnosis includes, but is not limited to, vaginal wall trauma, retained foreign body, with cyst, diverticulitis, appendicitis  {Patient presents with symptoms of an acute illness or injury that is potentially life-threatening.  46 year old female presents the ED with postcoital pelvic pain.  She reports more severe pain initially, since tapering off and feeling better.  Essentially resolved with Tylenol and naproxen.  Urine with ketones suggestive of dehydration.  Normal hemoglobin but slightly elevated WBCs are noted.  Normal metabolic panel.  Due to her continued but mild tenderness we performed a CT scan which demonstrates stigmata of a ruptured ovarian hemorrhagic cyst as the likely etiology of her symptoms.  No clinical signs or symptoms of any continued bleeding.  She is well-established with OB/GYN and we discussed following up with them this week.  We discussed return precautions and she is suitable for outpatient management.  I did consider observation admission for this patient.  Clinical Course as of 10/17/21 0455  Sun Oct 17, 2021   0300 Reassessed.  Feeling little bit better after the Tylenol and Naprosyn.  We discussed urine with ketones suggestive of dehydration but no infectious features to suggest cystitis.  I recommend IV fluids and CT and she is agreeable. [DS]    Clinical Course User Index [DS] Vladimir Crofts, MD     FINAL CLINICAL IMPRESSION(S) / ED DIAGNOSES   Final diagnoses:  Pelvic pain in female  Hemorrhagic cyst of left ovary  Hemoperitoneum     Rx / DC Orders   ED Discharge Orders     None        Note:  This document was prepared using Dragon voice recognition software and may include unintentional dictation errors.   Vladimir Crofts, MD 10/17/21 [phone]

## 2021-10-17 NOTE — Discharge Instructions (Addendum)
As we discussed, you have a 4 cm cyst on your left ovary that appears hemorrhagic, or bleeding.  There is a small volume of blood in your pelvis around this area as well.  This is the likely source of your pain.  Please follow-up with your gynecologist later today and let them know about this to be seen in the clinic sooner than your visit in November.  Ideally this coming week for recheck.  In the meantime, if you develop any severely worsening pain, fevers or other worsening symptoms, please return to the ED.

## 2021-10-20 ENCOUNTER — Ambulatory Visit: Payer: BC Managed Care – PPO | Admitting: Psychology

## 2021-10-21 ENCOUNTER — Ambulatory Visit: Payer: BC Managed Care – PPO | Admitting: Psychology

## 2021-10-28 ENCOUNTER — Ambulatory Visit: Payer: BC Managed Care – PPO | Admitting: Psychology

## 2021-11-03 ENCOUNTER — Ambulatory Visit (INDEPENDENT_AMBULATORY_CARE_PROVIDER_SITE_OTHER): Payer: BC Managed Care – PPO | Admitting: Psychology

## 2021-11-03 DIAGNOSIS — F4310 Post-traumatic stress disorder, unspecified: Secondary | ICD-10-CM | POA: Diagnosis not present

## 2021-11-03 DIAGNOSIS — Z634 Disappearance and death of family member: Secondary | ICD-10-CM

## 2021-11-03 DIAGNOSIS — F4321 Adjustment disorder with depressed mood: Secondary | ICD-10-CM

## 2021-11-03 NOTE — Progress Notes (Signed)
[facility] Counselor/Therapist Progress Note  Patient ID: Heidi [name], MRN: [id number],    Date:  11/03/2021  Time Spent: 60 minutes  Treatment Type: Individual Therapy  Reported Symptoms: sadness, irritability,   Mental Status Exam: Appearance:  Casual     Behavior: Appropriate  Motor: Normal  Speech/Language:  Normal Rate  Affect:  blunted  Mood:  normal  Thought process: normal  Thought content:   WNL  Sensory/Perceptual disturbances:   WNL  Orientation: oriented to person, place, time/date, and situation  Attention: Good  Concentration: Good  Memory: WNL  Fund of knowledge:  Good  Insight:   Good  Judgment:  Good  Impulse Control: Good   Risk Assessment: Danger to Self:  No Self-injurious Behavior: No Danger to Others: No Duty to Warn:no Physical Aggression / Violence:No  Access to Firearms a concern: No  Gang Involvement:No   Subjective: The patient attended a face to face individual therapy session in the office today.  The patient presents with a blunted affect and mood is pleasant.  The patient states that she has been doing okay since I saw her last.  She talked about what has been going on in her life and her mother had a mini stroke and the patient had a cyst rupture.  The patient has lost about 15 pounds over the last several weeks and is happy about the weight loss.  The patient reports that she and her son went to the memorial of her other son on his birthday and celebrated his birthday there at the [place].  It sounds like she and her son handled it well.  The patient talked at the end of the session about always putting on the face and pretending to be something that she is not.  We talked about the need to have further conversation about this as she brought it up at the end of the session.  The patient states that she does this because no one wants to hear how she really feels.  We will address this at our next  session.  Interventions: Grief Therapy, Insight Oriented and CBT  Diagnosis:PTSD (post-traumatic stress disorder)  Grief at loss of child  Plan: Please see plan inClient Abilities/Strengths  Intelligent, ability for insight, motivated  Client Treatment Preferences  Outpatient Individual therapy  Client Statement of Needs  "I need some therapy to deal with my trauma and my grief"  Treatment Level  Outpatient Individual therapy  Symptoms  Experiences disturbances in sleep.: (Status: improved). Experiences  disturbing and persistent thoughts, images, and/or perceptions of the traumatic event.: (Status: improved). Has been exposed to a traumatic event involving actual or perceived  threat of death or serious injury.: (Status: maintained). Impairment in social,  occupational, or other areas of functioning.:  (Status: improved). Reports  difficulty concentrating as well as feelings of guilt.: (Status: improved).  Thoughts dominated by loss coupled with poor concentration, tearful spells, and confusion about the  future.: (Status: Improved).  Problems Addressed  Grief / Loss Unresolved, Posttraumatic Stress Disorder (PTSD)  Goals 1. Begin a healthy grieving process around the loss. Objective Begin verbalizing feelings associated with the loss. Target Date: 2022-09-24 Frequency: Weekly Progress: 50 Modality: individual Related Interventions 1. Assist the client in identifying and expressing feelings connected with his/her loss. Objective Tell in detail the story of the current loss that is triggering symptoms. Target Date: 2022-09-24 Frequency: Weekly Progress: 60 Modality: individual Related Interventions 1. Create a safe environment for disclosure and actively build the  level of trust with the client in  individual sessions through consistent eye contact, active listening, unconditional positive  regard, and warm acceptance to help increase his/her ability to identify and express  thoughts  and feelings. 2. Use empathy, compassion, and support, allowing the client to tell in detail the story of his/her  recent loss. 2. Returns to the level of psychological functioning prior to exposure to  the traumatic event. Objective Participate in Eye Movement Desensitization and Reprocessing (EMDR) to reduce emotional distress  related to traumatic thoughts, feelings, and images. Target Date: 2022-09-24 Frequency: Weekly Progress: 0 Modality: individual Related Interventions 1. Utilize Eye Movement Desensitization and Reprocessing (EMDR) to reduce the client's  emotional reactivity to the traumatic event and reduce PTSD symptoms. Objective Learn and implement guided self-dialogue to manage thoughts, feelings, and urges brought on by  encounters with trauma-related situations. Target Date: 2022-09-24 Frequency: Weekly Progress: 50 Modality: individual  Related Interventions 1. Teach the client a guided self-dialogue procedure in which he/she learns to recognize  maladaptive self-talk, challenges its biases, copes with engendered feelings, overcomes  avoidance, and reinforces his/her accomplishments; review and reinforce progress, problemsolve obstacles. Diagnosis Axis  F43.10 (Posttraumatic stress disorder) - Posttraumatic Stress  Disorder  Conditions For Discharge Achievement of treatment goals and objectives  The patient approved this plan and is currently maintaining progress.    Deserai Cansler G Tieara Flitton, LCSW

## 2021-11-04 ENCOUNTER — Ambulatory Visit: Payer: BC Managed Care – PPO | Admitting: Psychology

## 2021-11-10 ENCOUNTER — Ambulatory Visit (INDEPENDENT_AMBULATORY_CARE_PROVIDER_SITE_OTHER): Payer: BC Managed Care – PPO | Admitting: Psychology

## 2021-11-10 DIAGNOSIS — F4310 Post-traumatic stress disorder, unspecified: Secondary | ICD-10-CM | POA: Diagnosis not present

## 2021-11-10 DIAGNOSIS — F4321 Adjustment disorder with depressed mood: Secondary | ICD-10-CM | POA: Diagnosis not present

## 2021-11-10 DIAGNOSIS — Z634 Disappearance and death of family member: Secondary | ICD-10-CM

## 2021-11-11 ENCOUNTER — Ambulatory Visit: Payer: BC Managed Care – PPO | Admitting: Psychology

## 2021-11-11 NOTE — Progress Notes (Signed)
[facility] Counselor/Therapist Progress Note  Patient ID: Heidi [name], MRN: [id number],    Date:  11/10/2021  Time Spent: 60 minutes  Treatment Type: Individual Therapy  Reported Symptoms: sadness, irritability,   Mental Status Exam: Appearance:  Casual     Behavior: Appropriate  Motor: Normal  Speech/Language:  Normal Rate  Affect:  blunted  Mood:  normal  Thought process: normal  Thought content:   WNL  Sensory/Perceptual disturbances:   WNL  Orientation: oriented to person, place, time/date, and situation  Attention: Good  Concentration: Good  Memory: WNL  Fund of knowledge:  Good  Insight:   Good  Judgment:  Good  Impulse Control: Good   Risk Assessment: Danger to Self:  No Self-injurious Behavior: No Danger to Others: No Duty to Warn:no Physical Aggression / Violence:No  Access to Firearms a concern: No  Gang Involvement:No   Subjective: The patient attended a face to face individual therapy session in the office today.  The patient presents with a blunted affect and mood is pleasant.  The patient reports that she is doing okay.  She talked today about putting on a mask for everyone and she is afraid to talk about how she feels because she is afraid that people cannot handle it.  She reports that she even does this with her therapist even though she knows that that is the job of this therapist to listen to how she feels.  In some ways I wonder if this is a control mechanism because she really does not want to deal with some of her issues related to her son's death.  She states that she has always done this.  We talked about the need for her to do EMDR as one of the reactions that she tends to have is that she automatically goes to being anxious if someone does not contact her within a reasonable time.  She talked about this being worse since [name] passed away.  I encouraged her to consider doing EMDR so that she would not have as much of a reaction  and I explained to her that we would talk more about this during her next time together.  Interventions: Grief Therapy, Insight Oriented and CBT  Diagnosis:No diagnosis found.  Plan: Please see plan inClient Abilities/Strengths  Intelligent, ability for insight, motivated  Client Treatment Preferences  Outpatient Individual therapy  Client Statement of Needs  "I need some therapy to deal with my trauma and my grief"  Treatment Level  Outpatient Individual therapy  Symptoms  Experiences disturbances in sleep.: (Status: improved). Experiences  disturbing and persistent thoughts, images, and/or perceptions of the traumatic event.: (Status: improved). Has been exposed to a traumatic event involving actual or perceived  threat of death or serious injury.: (Status: maintained). Impairment in social,  occupational, or other areas of functioning.:  (Status: improved). Reports  difficulty concentrating as well as feelings of guilt.: (Status: improved).  Thoughts dominated by loss coupled with poor concentration, tearful spells, and confusion about the  future.: (Status: Improved).  Problems Addressed  Grief / Loss Unresolved, Posttraumatic Stress Disorder (PTSD)  Goals 1. Begin a healthy grieving process around the loss. Objective Begin verbalizing feelings associated with the loss. Target Date: 2022-09-24 Frequency: Weekly Progress: 50 Modality: individual Related Interventions 1. Assist the client in identifying and expressing feelings connected with his/her loss. Objective Tell in detail the story of the current loss that is triggering symptoms. Target Date: 2022-09-24 Frequency: Weekly Progress: 60 Modality: individual Related Interventions  1. Create a safe environment for disclosure and actively build the level of trust with the client in  individual sessions through consistent eye contact, active listening, unconditional positive  regard, and warm acceptance to help increase  his/her ability to identify and express thoughts  and feelings. 2. Use empathy, compassion, and support, allowing the client to tell in detail the story of his/her  recent loss. 2. Returns to the level of psychological functioning prior to exposure to  the traumatic event. Objective Participate in Eye Movement Desensitization and Reprocessing (EMDR) to reduce emotional distress  related to traumatic thoughts, feelings, and images. Target Date: 2022-09-24 Frequency: Weekly Progress: 0 Modality: individual Related Interventions 1. Utilize Eye Movement Desensitization and Reprocessing (EMDR) to reduce the client's  emotional reactivity to the traumatic event and reduce PTSD symptoms. Objective Learn and implement guided self-dialogue to manage thoughts, feelings, and urges brought on by  encounters with trauma-related situations. Target Date: 2022-09-24 Frequency: Weekly Progress: 50 Modality: individual  Related Interventions 1. Teach the client a guided self-dialogue procedure in which he/she learns to recognize  maladaptive self-talk, challenges its biases, copes with engendered feelings, overcomes  avoidance, and reinforces his/her accomplishments; review and reinforce progress, problemsolve obstacles. Diagnosis Axis  F43.10 (Posttraumatic stress disorder) - Posttraumatic Stress  Disorder  Conditions For Discharge Achievement of treatment goals and objectives  The patient approved this plan and is currently maintaining progress.    Makaylee Spielberg G Shadi Sessler, LCSW

## 2021-11-17 ENCOUNTER — Ambulatory Visit (INDEPENDENT_AMBULATORY_CARE_PROVIDER_SITE_OTHER): Payer: BC Managed Care – PPO | Admitting: Psychology

## 2021-11-17 DIAGNOSIS — Z634 Disappearance and death of family member: Secondary | ICD-10-CM | POA: Diagnosis not present

## 2021-11-17 DIAGNOSIS — F4310 Post-traumatic stress disorder, unspecified: Secondary | ICD-10-CM

## 2021-11-17 DIAGNOSIS — F4321 Adjustment disorder with depressed mood: Secondary | ICD-10-CM | POA: Diagnosis not present

## 2021-11-18 ENCOUNTER — Ambulatory Visit: Payer: BC Managed Care – PPO | Admitting: Psychology

## 2021-11-18 NOTE — Progress Notes (Signed)
[facility] Counselor/Therapist Progress Note  Patient ID: Ronne Savoia, MRN: [id number],    Date:  11/082023  Time Spent: 60 minutes  Treatment Type: Individual Therapy  Reported Symptoms: sadness, irritability,   Mental Status Exam: Appearance:  Casual     Behavior: Appropriate  Motor: Normal  Speech/Language:  Normal Rate  Affect:  blunted  Mood:  normal  Thought process: normal  Thought content:   WNL  Sensory/Perceptual disturbances:   WNL  Orientation: oriented to person, place, time/date, and situation  Attention: Good  Concentration: Good  Memory: WNL  Fund of knowledge:  Good  Insight:   Good  Judgment:  Good  Impulse Control: Good   Risk Assessment: Danger to Self:  No Self-injurious Behavior: No Danger to Others: No Duty to Warn:no Physical Aggression / Violence:No  Access to Firearms a concern: No  Gang Involvement:No   Subjective: The patient attended a face to face individual therapy session in the office today.  The patient presents with a blunted affect and mood is pleasant.  Today I brought up what the patient brought up about masking herself with everyone and she stated that she had something more pressing to deal with than that.  I think that she is avoiding dealing with her own issues and she did write some things over the weekend related to the loss of her son Aurelio Brash.  The patient talked about her nephew and is very concerned that he might at some point take his own life and she is feeling very anxious about that.  She talked about her concerns because he is masking his own depression and not talking about it and she also talked about how he wants to speak with his mother about these things but his mother also pretends that everything is okay.  I related this back to her own issues with masking things and her brother who committed suicide also did the same thing.  We talked about the importance of working through issues and feeling like  he can be her authentic self.  We will continue to work on trying to get her to be more real and stop taking care of everyone else and take care of herself.  Interventions: Grief Therapy, Insight Oriented and CBT  Diagnosis:PTSD (post-traumatic stress disorder)  Grief at loss of child  Plan: Please see plan inClient Abilities/Strengths  Intelligent, ability for insight, motivated  Client Treatment Preferences  Outpatient Individual therapy  Client Statement of Needs  "I need some therapy to deal with my trauma and my grief"  Treatment Level  Outpatient Individual therapy  Symptoms  Experiences disturbances in sleep.: (Status: improved). Experiences  disturbing and persistent thoughts, images, and/or perceptions of the traumatic event.: (Status: improved). Has been exposed to a traumatic event involving actual or perceived  threat of death or serious injury.: (Status: maintained). Impairment in social,  occupational, or other areas of functioning.:  (Status: improved). Reports  difficulty concentrating as well as feelings of guilt.: (Status: improved).  Thoughts dominated by loss coupled with poor concentration, tearful spells, and confusion about the  future.: (Status: Improved).  Problems Addressed  Grief / Loss Unresolved, Posttraumatic Stress Disorder (PTSD)  Goals 1. Begin a healthy grieving process around the loss. Objective Begin verbalizing feelings associated with the loss. Target Date: 2022-09-24 Frequency: Weekly Progress: 50 Modality: individual Related Interventions 1. Assist the client in identifying and expressing feelings connected with his/her loss. Objective Tell in detail the story of the current loss that is  triggering symptoms. Target Date: 2022-09-24 Frequency: Weekly Progress: 60 Modality: individual Related Interventions 1. Create a safe environment for disclosure and actively build the level of trust with the client in  individual sessions through  consistent eye contact, active listening, unconditional positive  regard, and warm acceptance to help increase his/her ability to identify and express thoughts  and feelings. 2. Use empathy, compassion, and support, allowing the client to tell in detail the story of his/her  recent loss. 2. Returns to the level of psychological functioning prior to exposure to  the traumatic event. Objective Participate in Eye Movement Desensitization and Reprocessing (EMDR) to reduce emotional distress  related to traumatic thoughts, feelings, and images. Target Date: 2022-09-24 Frequency: Weekly Progress: 0 Modality: individual Related Interventions 1. Utilize Eye Movement Desensitization and Reprocessing (EMDR) to reduce the client's  emotional reactivity to the traumatic event and reduce PTSD symptoms. Objective Learn and implement guided self-dialogue to manage thoughts, feelings, and urges brought on by  encounters with trauma-related situations. Target Date: 2022-09-24 Frequency: Weekly Progress: 50 Modality: individual  Related Interventions 1. Teach the client a guided self-dialogue procedure in which he/she learns to recognize  maladaptive self-talk, challenges its biases, copes with engendered feelings, overcomes  avoidance, and reinforces his/her accomplishments; review and reinforce progress, problemsolve obstacles. Diagnosis Axis  F43.10 (Posttraumatic stress disorder) - Posttraumatic Stress  Disorder  Conditions For Discharge Achievement of treatment goals and objectives  The patient approved this plan and is currently maintaining progress.    Joylyn Duggin G Quiara Killian, LCSW

## 2021-11-24 ENCOUNTER — Ambulatory Visit (INDEPENDENT_AMBULATORY_CARE_PROVIDER_SITE_OTHER): Payer: BC Managed Care – PPO | Admitting: Psychology

## 2021-11-24 DIAGNOSIS — Z634 Disappearance and death of family member: Secondary | ICD-10-CM | POA: Diagnosis not present

## 2021-11-24 DIAGNOSIS — F4321 Adjustment disorder with depressed mood: Secondary | ICD-10-CM | POA: Diagnosis not present

## 2021-11-24 DIAGNOSIS — F4310 Post-traumatic stress disorder, unspecified: Secondary | ICD-10-CM | POA: Diagnosis not present

## 2021-11-25 ENCOUNTER — Ambulatory Visit: Payer: BC Managed Care – PPO | Admitting: Psychology

## 2021-11-25 NOTE — Progress Notes (Signed)
[facility] Counselor/Therapist Progress Note  Patient ID: Heidi [name], MRN: [id number],    Date:  11/24/2021  Time Spent: 60 minutes  Treatment Type: Individual Therapy  Reported Symptoms: sadness, irritability,   Mental Status Exam: Appearance:  Casual     Behavior: Appropriate  Motor: Normal  Speech/Language:  Normal Rate  Affect:  blunted  Mood:  normal  Thought process: normal  Thought content:   WNL  Sensory/Perceptual disturbances:   WNL  Orientation: oriented to person, place, time/date, and situation  Attention: Good  Concentration: Good  Memory: WNL  Fund of knowledge:  Good  Insight:   Good  Judgment:  Good  Impulse Control: Good   Risk Assessment: Danger to Self:  No Self-injurious Behavior: No Danger to Others: No Duty to Warn:no Physical Aggression / Violence:No  Access to Firearms a concern: No  Gang Involvement:No   Subjective: The patient attended a face to face individual therapy session in the office today.  The patient presents with a blunted affect and mood is pleasant.  The patient seems to be doing a little more with writing and grieving.  She talked about having ebbs and flows of sadness.  She has been doing some writing and putting some small movies about her son.  We talked about her possibly being able to use this to help other people with suicide prevention.  In addition, we talked about how much she dislikes her job.  She has an interview next week and we talked about how she plans on handling that.   Interventions: Grief Therapy, Insight Oriented and CBT  Diagnosis:PTSD (post-traumatic stress disorder)  Grief at loss of child  Plan: Please see plan inClient Abilities/Strengths  Intelligent, ability for insight, motivated  Client Treatment Preferences  Outpatient Individual therapy  Client Statement of Needs  "I need some therapy to deal with my trauma and my grief"  Treatment Level  Outpatient Individual  therapy  Symptoms  Experiences disturbances in sleep.: (Status: improved). Experiences  disturbing and persistent thoughts, images, and/or perceptions of the traumatic event.: (Status: improved). Has been exposed to a traumatic event involving actual or perceived  threat of death or serious injury.: (Status: maintained). Impairment in social,  occupational, or other areas of functioning.:  (Status: improved). Reports  difficulty concentrating as well as feelings of guilt.: (Status: improved).  Thoughts dominated by loss coupled with poor concentration, tearful spells, and confusion about the  future.: (Status: Improved).  Problems Addressed  Grief / Loss Unresolved, Posttraumatic Stress Disorder (PTSD)  Goals 1. Begin a healthy grieving process around the loss. Objective Begin verbalizing feelings associated with the loss. Target Date: 2022-09-24 Frequency: Weekly Progress: 50 Modality: individual Related Interventions 1. Assist the client in identifying and expressing feelings connected with his/her loss. Objective Tell in detail the story of the current loss that is triggering symptoms. Target Date: 2022-09-24 Frequency: Weekly Progress: 60 Modality: individual Related Interventions 1. Create a safe environment for disclosure and actively build the level of trust with the client in  individual sessions through consistent eye contact, active listening, unconditional positive  regard, and warm acceptance to help increase his/her ability to identify and express thoughts  and feelings. 2. Use empathy, compassion, and support, allowing the client to tell in detail the story of his/her  recent loss. 2. Returns to the level of psychological functioning prior to exposure to  the traumatic event. Objective Participate in Eye Movement Desensitization and Reprocessing (EMDR) to reduce emotional distress  related to  traumatic thoughts, feelings, and images. Target Date: 2022-09-24 Frequency:  Weekly Progress: 0 Modality: individual Related Interventions 1. Utilize Eye Movement Desensitization and Reprocessing (EMDR) to reduce the client's  emotional reactivity to the traumatic event and reduce PTSD symptoms. Objective Learn and implement guided self-dialogue to manage thoughts, feelings, and urges brought on by  encounters with trauma-related situations. Target Date: 2022-09-24 Frequency: Weekly Progress: 50 Modality: individual  Related Interventions 1. Teach the client a guided self-dialogue procedure in which he/she learns to recognize  maladaptive self-talk, challenges its biases, copes with engendered feelings, overcomes  avoidance, and reinforces his/her accomplishments; review and reinforce progress, problemsolve obstacles. Diagnosis Axis  F43.10 (Posttraumatic stress disorder) - Posttraumatic Stress  Disorder  Conditions For Discharge Achievement of treatment goals and objectives  The patient approved this plan and is currently maintaining progress.    Keeshawn Fakhouri G Perkins Molina, LCSW

## 2021-11-29 ENCOUNTER — Encounter: Payer: Self-pay | Admitting: Family Medicine

## 2021-11-29 ENCOUNTER — Ambulatory Visit (INDEPENDENT_AMBULATORY_CARE_PROVIDER_SITE_OTHER): Payer: BC Managed Care – PPO | Admitting: Family Medicine

## 2021-11-29 VITALS — BP 117/68 | HR 70 | Temp 98.1°F | Wt 164.0 lb

## 2021-11-29 DIAGNOSIS — F439 Reaction to severe stress, unspecified: Secondary | ICD-10-CM

## 2021-11-29 DIAGNOSIS — Z716 Tobacco abuse counseling: Secondary | ICD-10-CM

## 2021-11-29 NOTE — Progress Notes (Signed)
I,Heidi [name],acting as a scribe for Mila Merry, MD.,have documented all relevant documentation on the behalf of Mila Merry, MD,as directed by  Mila Merry, MD while in the presence of Mila Merry, MD.    Established patient visit   Patient: Heidi [name]   DOB: [birth year]   46 y.o. Female  MRN: [id number] Visit Date: 11/29/2021  Today's healthcare provider: Mila Merry, MD   Chief Complaint  Patient presents with   Tobacco use   Subjective    HPI  Tobacco use: Patient is here to discuss smoking cessation. Smokes intermittently and has quit in the past. Desire to smoke is usually related to stress and anxiety which has been worse lately. She intends to stop smoking at some point, but is currently under quite a bit of stress and not a point where she feels she can quit successfully.    Medications: Outpatient Medications Prior to Visit  Medication Sig   diclofenac (VOLTAREN) 75 MG EC tablet TAKE 1 TABLET BY MOUTH TWICE A DAY AS NEEDED   eszopiclone (LUNESTA) 1 MG TABS tablet Take 1 mg by mouth at bedtime.   gabapentin (NEURONTIN) 300 MG capsule TAKE 1-2 CAPSULES (300-600 MG TOTAL) BY MOUTH AT BEDTIME.   lamoTRIgine (LAMICTAL) 200 MG tablet Take by mouth every morning.   valACYclovir (VALTREX) 1000 MG tablet TAKE 1 TABLET BY MOUTH EVERY DAY   albuterol (VENTOLIN HFA) 108 (90 Base) MCG/ACT inhaler Inhale 2 puffs into the lungs every 6 (six) hours as needed for wheezing or shortness of breath.   Cholecalciferol (VITAMIN D3) 1.25 MG (50000 UT) CAPS Take 1 capsule by mouth once a week.   No facility-administered medications prior to visit.    Review of Systems  Constitutional:  Negative for appetite change, chills, fatigue and fever.  Respiratory:  Negative for chest tightness and shortness of breath.   Cardiovascular:  Negative for chest pain and palpitations.  Gastrointestinal:  Negative for abdominal pain, nausea and vomiting.  Neurological:  Negative  for dizziness and weakness.       Objective    BP 117/68 (BP Location: Left Arm, Patient Position: Sitting, Cuff Size: Normal)   Pulse 70   Temp 98.1 F (36.7 C) (Oral)   Wt 164 lb (74.4 kg)   SpO2 100% Comment: room air  BMI 24.94 kg/m    Physical Exam   General appearance: Well developed, well nourished female, cooperative and in no acute distress Head: Normocephalic, without obvious abnormality, atraumatic Respiratory: Respirations even and unlabored, normal respiratory rate Extremities: All extremities are intact.  Skin: Skin color, texture, turgor normal. No rashes seen  Psych: Appropriate mood and affect. Neurologic: Mental status: Alert, oriented to person, place, and time, thought content appropriate.   Assessment & Plan     1. Encounter for smoking cessation counseling Discussed health benefits of smoking cessation. She is currently enduring quite a bit of situational stress and not currently in a state that she feels she can quit successfully. She does intend to quit at some point. She does not want to try smoking cessation medications at this time, particularly due to potential to interact with other medications.   2. Situational stress        The entirety of the information documented in the History of Present Illness, Review of Systems and Physical Exam were personally obtained by me. Portions of this information were initially documented by the CMA and reviewed by me for thoroughness and accuracy.  Lelon Huh, MD  [facility] [phone] (phone) [phone] (fax)  [place]

## 2021-12-01 ENCOUNTER — Ambulatory Visit (INDEPENDENT_AMBULATORY_CARE_PROVIDER_SITE_OTHER): Payer: BC Managed Care – PPO | Admitting: Psychology

## 2021-12-01 DIAGNOSIS — F4310 Post-traumatic stress disorder, unspecified: Secondary | ICD-10-CM

## 2021-12-01 DIAGNOSIS — F4321 Adjustment disorder with depressed mood: Secondary | ICD-10-CM

## 2021-12-01 DIAGNOSIS — Z634 Disappearance and death of family member: Secondary | ICD-10-CM | POA: Diagnosis not present
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
## 2021-12-02 ENCOUNTER — Ambulatory Visit: Payer: BC Managed Care – PPO | Admitting: Psychology

## 2021-12-07 ENCOUNTER — Encounter: Payer: Self-pay | Admitting: Family Medicine

## 2021-12-08 ENCOUNTER — Ambulatory Visit (INDEPENDENT_AMBULATORY_CARE_PROVIDER_SITE_OTHER): Payer: BC Managed Care – PPO | Admitting: Psychology

## 2021-12-08 DIAGNOSIS — F4310 Post-traumatic stress disorder, unspecified: Secondary | ICD-10-CM | POA: Diagnosis not present

## 2021-12-08 DIAGNOSIS — Z634 Disappearance and death of family member: Secondary | ICD-10-CM | POA: Diagnosis not present

## 2021-12-08 DIAGNOSIS — F4321 Adjustment disorder with depressed mood: Secondary | ICD-10-CM

## 2021-12-08 NOTE — Patient Instructions (Signed)
I strongly encourage you to stop smoking. Health benefits and strategies for smoking cessation were discussed at today's visit. Marland Kitchen

## 2021-12-09 ENCOUNTER — Ambulatory Visit: Payer: BC Managed Care – PPO | Admitting: Psychology
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
## 2021-12-15 ENCOUNTER — Ambulatory Visit (INDEPENDENT_AMBULATORY_CARE_PROVIDER_SITE_OTHER): Payer: BC Managed Care – PPO | Admitting: Psychology

## 2021-12-15 DIAGNOSIS — Z634 Disappearance and death of family member: Secondary | ICD-10-CM | POA: Diagnosis not present

## 2021-12-15 DIAGNOSIS — F4310 Post-traumatic stress disorder, unspecified: Secondary | ICD-10-CM

## 2021-12-15 DIAGNOSIS — F4321 Adjustment disorder with depressed mood: Secondary | ICD-10-CM

## 2021-12-16 ENCOUNTER — Ambulatory Visit: Payer: BC Managed Care – PPO | Admitting: Psychology
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
## 2021-12-22 ENCOUNTER — Ambulatory Visit (INDEPENDENT_AMBULATORY_CARE_PROVIDER_SITE_OTHER): Payer: BC Managed Care – PPO | Admitting: Psychology

## 2021-12-22 DIAGNOSIS — F4321 Adjustment disorder with depressed mood: Secondary | ICD-10-CM

## 2021-12-22 DIAGNOSIS — Z634 Disappearance and death of family member: Secondary | ICD-10-CM | POA: Diagnosis not present

## 2021-12-22 DIAGNOSIS — F4310 Post-traumatic stress disorder, unspecified: Secondary | ICD-10-CM | POA: Diagnosis not present

## 2021-12-23 ENCOUNTER — Ambulatory Visit: Payer: BC Managed Care – PPO | Admitting: Psychology
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
## 2021-12-29 ENCOUNTER — Ambulatory Visit (INDEPENDENT_AMBULATORY_CARE_PROVIDER_SITE_OTHER): Payer: BC Managed Care – PPO | Admitting: Psychology

## 2021-12-29 DIAGNOSIS — Z634 Disappearance and death of family member: Secondary | ICD-10-CM

## 2021-12-29 DIAGNOSIS — F4321 Adjustment disorder with depressed mood: Secondary | ICD-10-CM

## 2021-12-29 DIAGNOSIS — F4310 Post-traumatic stress disorder, unspecified: Secondary | ICD-10-CM | POA: Diagnosis not present

## 2021-12-30 ENCOUNTER — Ambulatory Visit: Payer: BC Managed Care – PPO | Admitting: Psychology
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
## 2022-01-05 ENCOUNTER — Ambulatory Visit: Payer: BC Managed Care – PPO | Admitting: Psychology

## 2022-01-06 ENCOUNTER — Ambulatory Visit: Payer: BC Managed Care – PPO | Admitting: Psychology

## 2022-02-08 ENCOUNTER — Other Ambulatory Visit: Payer: Self-pay | Admitting: Family Medicine

## 2022-02-08 DIAGNOSIS — M359 Systemic involvement of connective tissue, unspecified: Secondary | ICD-10-CM

## 2022-02-08 NOTE — Telephone Encounter (Signed)
Requested Prescriptions  Pending Prescriptions Disp Refills   diclofenac (VOLTAREN) 75 MG EC tablet [Pharmacy Med Name: DICLOFENAC SOD EC 75 MG TAB] 180 tablet 1    Sig: TAKE 1 TABLET BY MOUTH TWICE A DAY AS NEEDED     Analgesics:  NSAIDS Failed - 02/08/2022  1:43 AM      Failed - Manual Review: Labs are only required if the patient has taken medication for more than 8 weeks.      Passed - Cr in normal range and within 360 days    Creatinine  Date Value Ref Range Status  05/13/2011 0.77 0.60 - 1.30 mg/dL Final   Creatinine, Ser  Date Value Ref Range Status  10/16/2021 0.74 0.44 - 1.00 mg/dL Final         Passed - HGB in normal range and within 360 days    Hemoglobin  Date Value Ref Range Status  10/16/2021 13.0 12.0 - 15.0 g/dL Final  06/18/2020 12.7 11.1 - 15.9 g/dL Final         Passed - PLT in normal range and within 360 days    Platelets  Date Value Ref Range Status  10/16/2021 294 150 - 400 K/uL Final  06/18/2020 341 150 - 450 x10E3/uL Final         Passed - HCT in normal range and within 360 days    HCT  Date Value Ref Range Status  10/16/2021 38.9 36.0 - 46.0 % Final   Hematocrit  Date Value Ref Range Status  06/18/2020 37.5 34.0 - 46.6 % Final         Passed - eGFR is 30 or above and within 360 days    EGFR (African American)  Date Value Ref Range Status  05/13/2011 >60  Final   GFR calc Af Amer  Date Value Ref Range Status  04/09/2019 108 >59 mL/min/1.73 Final   EGFR (Non-African Amer.)  Date Value Ref Range Status  05/13/2011 >60  Final    Comment:    eGFR values <61mL/min/1.73 m2 may be an indication of chronic kidney disease (CKD). Calculated eGFR is useful in patients with stable renal function. The eGFR calculation will not be reliable in acutely ill patients when serum creatinine is changing rapidly. It is not useful in  patients on dialysis. The eGFR calculation may not be applicable to patients at the low and high extremes of body sizes,  pregnant women, and vegetarians.    GFR, Estimated  Date Value Ref Range Status  10/16/2021 >60 >60 mL/min Final    Comment:    (NOTE) Calculated using the CKD-EPI Creatinine Equation (2021)    eGFR  Date Value Ref Range Status  04/03/2020 92 >59 mL/min/1.73 Final         Passed - Patient is not pregnant      Passed - Valid encounter within last 12 months    Recent Outpatient Visits           2 months ago Encounter for smoking cessation counseling   [facility] Birdie Sons, MD   11 months ago Mild intermittent asthma with acute exacerbation   [place] Gwyneth Sprout, FNP   1 year ago Lower abdominal pain   [employer] [facility] Birdie Sons, MD   1 year ago Other fatigue   [place], Donald E, MD   2 years ago Restless leg syndrome   [place]  [facility] Caryn Section, Kirstie Peri, MD

## 2022-02-16 ENCOUNTER — Ambulatory Visit: Payer: BC Managed Care – PPO | Admitting: Psychology

## 2022-02-16 DIAGNOSIS — F4321 Adjustment disorder with depressed mood: Secondary | ICD-10-CM

## 2022-02-16 DIAGNOSIS — Z634 Disappearance and death of family member: Secondary | ICD-10-CM | POA: Diagnosis not present

## 2022-02-16 DIAGNOSIS — F4310 Post-traumatic stress disorder, unspecified: Secondary | ICD-10-CM
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
## 2022-03-02 ENCOUNTER — Ambulatory Visit: Payer: BC Managed Care – PPO | Admitting: Psychology

## 2022-03-02 DIAGNOSIS — F4310 Post-traumatic stress disorder, unspecified: Secondary | ICD-10-CM

## 2022-03-02 DIAGNOSIS — Z634 Disappearance and death of family member: Secondary | ICD-10-CM | POA: Diagnosis not present

## 2022-03-02 DIAGNOSIS — F4321 Adjustment disorder with depressed mood: Secondary | ICD-10-CM
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
## 2022-03-15 ENCOUNTER — Ambulatory Visit: Payer: BC Managed Care – PPO | Admitting: Psychology

## 2022-03-15 DIAGNOSIS — F4310 Post-traumatic stress disorder, unspecified: Secondary | ICD-10-CM | POA: Diagnosis not present

## 2022-03-15 DIAGNOSIS — Z634 Disappearance and death of family member: Secondary | ICD-10-CM

## 2022-03-15 DIAGNOSIS — F4321 Adjustment disorder with depressed mood: Secondary | ICD-10-CM
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
## 2022-03-30 ENCOUNTER — Ambulatory Visit: Payer: BC Managed Care – PPO | Admitting: Psychology

## 2022-03-30 DIAGNOSIS — F4323 Adjustment disorder with mixed anxiety and depressed mood: Secondary | ICD-10-CM

## 2022-03-30 DIAGNOSIS — F4310 Post-traumatic stress disorder, unspecified: Secondary | ICD-10-CM

## 2022-03-30 NOTE — Progress Notes (Signed)
[facility] Counselor/Therapist Progress Note  Patient ID: Heidi [name], MRN: [id number],    Date:  03/30/2022  Time Spent: 60 minutes  Treatment Type: Individual Therapy  Reported Symptoms: sadness, irritability,   Mental Status Exam: Appearance:  Casual     Behavior: Appropriate  Motor: Normal  Speech/Language:  Normal Rate  Affect:  blunted  Mood:  normal  Thought process: normal  Thought content:   WNL  Sensory/Perceptual disturbances:   WNL  Orientation: oriented to person, place, time/date, and situation  Attention: Good  Concentration: Good  Memory: WNL  Fund of knowledge:  Good  Insight:   Good  Judgment:  Good  Impulse Control: Good   Risk Assessment: Danger to Self:  No Self-injurious Behavior: No Danger to Others: No Duty to Warn:no Physical Aggression / Violence:No  Access to Firearms a concern: No  Gang Involvement:No   Subjective: The patient attended a face to face individual therapy session in the office today.  The patient presents with a blunted affect and mood is pleasant.   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  She is exploring options as far as career change but is struggling because she makes more money than the entry-level person would make and she would have to spend money to go back to school to do something different.  We talked a little more about her options and we will continue  to explore these.  The patient also talked about a situation that happened at school and vented about that circumstance.  We talked about how she plans to handle it moving forward. Interventions: Grief Therapy, Insight Oriented and CBT, problem solving  Diagnosis:PTSD (post-traumatic stress disorder)  Adjustment disorder with mixed anxiety and depressed mood  Plan: Please see plan inClient Abilities/Strengths  Intelligent, ability for insight, motivated  Client Treatment Preferences   Outpatient Individual therapy  Client Statement of Needs  "I need some therapy to deal with my trauma and my grief"  Treatment Level  Outpatient Individual therapy  Symptoms  Experiences disturbances in sleep.: (Status: improved). Experiences  disturbing and persistent thoughts, images, and/or perceptions of the traumatic event.: (Status: improved). Has been exposed to a traumatic event involving actual or perceived  threat of death or serious injury.: (Status: maintained). Impairment in social,  occupational, or other areas of functioning.:  (Status: improved). Reports  difficulty concentrating as well as feelings of guilt.: (Status: improved).  Thoughts dominated by loss coupled with poor concentration, tearful spells, and confusion about the  future.: (Status: Improved).  Problems Addressed  Grief / Loss Unresolved, Posttraumatic Stress Disorder (PTSD)  Goals 1. Begin a healthy grieving process around the loss. Objective Begin verbalizing feelings associated with the loss. Target Date: 2022-09-24 Frequency: Weekly Progress: 50 Modality: individual Related Interventions 1. Assist the client in identifying and expressing feelings connected with his/her loss. Objective Tell in detail the story of the current loss that is triggering symptoms. Target Date: 2022-09-24 Frequency: Weekly Progress: 60 Modality: individual Related Interventions 1. Create a safe environment for disclosure and actively build the level of trust with the client in  individual sessions through consistent eye contact, active listening, unconditional positive  regard, and warm acceptance to help increase his/her ability to identify and express thoughts  and feelings. 2. Use empathy, compassion, and support, allowing the client to tell in detail the story of his/her  recent loss. 2. Returns to the level of psychological functioning prior  to exposure to  the traumatic event. Objective Participate in Eye  Movement Desensitization and Reprocessing (EMDR) to reduce emotional distress  related to traumatic thoughts, feelings, and images. Target Date: 2022-09-24 Frequency: Weekly Progress: 0 Modality: individual Related Interventions 1. Utilize Eye Movement Desensitization and Reprocessing (EMDR) to reduce the client's  emotional reactivity to the traumatic event and reduce PTSD symptoms. Objective Learn and implement guided self-dialogue to manage thoughts, feelings, and urges brought on by  encounters with trauma-related situations. Target Date: 2022-09-24 Frequency: Weekly Progress: 50 Modality: individual  Related Interventions 1. Teach the client a guided self-dialogue procedure in which he/she learns to recognize  maladaptive self-talk, challenges its biases, copes with engendered feelings, overcomes  avoidance, and reinforces his/her accomplishments; review and reinforce progress, problemsolve obstacles. Diagnosis Axis  F43.10 (Posttraumatic stress disorder) - Posttraumatic Stress  Disorder  Conditions For Discharge Achievement of treatment goals and objectives  The patient approved this plan and is currently maintaining progress.    Daelin Haste G Othelia Riederer, LCSW

## 2022-04-13 ENCOUNTER — Ambulatory Visit: Payer: BC Managed Care – PPO | Admitting: Psychology

## 2022-04-27 ENCOUNTER — Ambulatory Visit: Payer: BC Managed Care – PPO | Admitting: Psychology

## 2022-04-27 DIAGNOSIS — Z634 Disappearance and death of family member: Secondary | ICD-10-CM

## 2022-04-27 DIAGNOSIS — F4310 Post-traumatic stress disorder, unspecified: Secondary | ICD-10-CM | POA: Diagnosis not present

## 2022-04-27 DIAGNOSIS — F4321 Adjustment disorder with depressed mood: Secondary | ICD-10-CM | POA: Diagnosis not present

## 2022-04-28 NOTE — Progress Notes (Signed)
[facility] Counselor/Therapist Progress Note  Patient ID: Kerston Landeck, MRN: [id number],    Date:  04/27/2022  Time Spent: 60 minutes  Treatment Type: Individual Therapy  Reported Symptoms: sadness, irritability,   Mental Status Exam: Appearance:  Casual     Behavior: Appropriate  Motor: Normal  Speech/Language:  Normal Rate  Affect:  blunted  Mood:  normal  Thought process: normal  Thought content:   WNL  Sensory/Perceptual disturbances:   WNL  Orientation: oriented to person, place, time/date, and situation  Attention: Good  Concentration: Good  Memory: WNL  Fund of knowledge:  Good  Insight:   Good  Judgment:  Good  Impulse Control: Good   Risk Assessment: Danger to Self:  No Self-injurious Behavior: No Danger to Others: No Duty to Warn:no Physical Aggression / Violence:No  Access to Firearms a concern: No  Gang Involvement:No   Subjective: The patient attended a face to face individual therapy session in the office today.  The patient presents with a blunted affect and mood is pleasant.   The patient reports that things are going okay.  She is still grieving the loss of her son but seems to be handling that very well at this point in time.  Today she brought up in issue and concern about her memory.  And we did some problem solving around this to help her know how to move forward with the situation.  I recommended that she possibly get tested to see about whether she is entering menopause as she reports some word-finding problems.  In addition we talked about her contacting her PCP to get a referral to a neuropsychologist for cognitive testing as the last step and I recommended that she talk with her psychiatrist about the possibility that her medicine could be causing some of these issues. Diagnosis:PTSD (post-traumatic stress disorder)  Grief at loss of child  Plan: Please see plan inClient Abilities/Strengths  Intelligent, ability for insight,  motivated  Client Treatment Preferences  Outpatient Individual therapy  Client Statement of Needs  "I need some therapy to deal with my trauma and my grief"  Treatment Level  Outpatient Individual therapy  Symptoms  Experiences disturbances in sleep.: (Status: improved). Experiences  disturbing and persistent thoughts, images, and/or perceptions of the traumatic event.: (Status: improved). Has been exposed to a traumatic event involving actual or perceived  threat of death or serious injury.: (Status: maintained). Impairment in social,  occupational, or other areas of functioning.:  (Status: improved). Reports  difficulty concentrating as well as feelings of guilt.: (Status: improved).  Thoughts dominated by loss coupled with poor concentration, tearful spells, and confusion about the  future.: (Status: Improved).  Problems Addressed  Grief / Loss Unresolved, Posttraumatic Stress Disorder (PTSD)  Goals 1. Begin a healthy grieving process around the loss. Objective Begin verbalizing feelings associated with the loss. Target Date: 2022-09-24 Frequency: Weekly Progress: 50 Modality: individual Related Interventions 1. Assist the client in identifying and expressing feelings connected with his/her loss. Objective Tell in detail the story of the current loss that is triggering symptoms. Target Date: 2022-09-24 Frequency: Weekly Progress: 60 Modality: individual Related Interventions 1. Create a safe environment for disclosure and actively build the level of trust with the client in  individual sessions through consistent eye contact, active listening, unconditional positive  regard, and warm acceptance to help increase his/her ability to identify and express thoughts  and feelings. 2. Use empathy, compassion, and support, allowing the client to tell in detail the story of  his/her  recent loss. 2. Returns to the level of psychological functioning prior to exposure to  the traumatic  event. Objective Participate in Eye Movement Desensitization and Reprocessing (EMDR) to reduce emotional distress  related to traumatic thoughts, feelings, and images. Target Date: 2022-09-24 Frequency: Weekly Progress: 0 Modality: individual Related Interventions 1. Utilize Eye Movement Desensitization and Reprocessing (EMDR) to reduce the client's  emotional reactivity to the traumatic event and reduce PTSD symptoms. Objective Learn and implement guided self-dialogue to manage thoughts, feelings, and urges brought on by  encounters with trauma-related situations. Target Date: 2022-09-24 Frequency: Weekly Progress: 50 Modality: individual  Related Interventions 1. Teach the client a guided self-dialogue procedure in which he/she learns to recognize  maladaptive self-talk, challenges its biases, copes with engendered feelings, overcomes  avoidance, and reinforces his/her accomplishments; review and reinforce progress, problemsolve obstacles. Diagnosis Axis  F43.10 (Posttraumatic stress disorder) - Posttraumatic Stress  Disorder  Conditions For Discharge Achievement of treatment goals and objectives  The patient approved this plan and is currently maintaining progress.    Dontasia Miranda G Mithran Strike, LCSW

## 2022-05-08 ENCOUNTER — Encounter: Payer: Self-pay | Admitting: Family Medicine

## 2022-05-09 ENCOUNTER — Encounter: Payer: Self-pay | Admitting: Family Medicine

## 2022-05-09 ENCOUNTER — Ambulatory Visit
Admission: RE | Admit: 2022-05-09 | Discharge: 2022-05-09 | Disposition: A | Discharge status: Home / Self Care | Payer: BC Managed Care – PPO | Source: Ambulatory Visit | Attending: Family Medicine | Admitting: Family Medicine

## 2022-05-09 ENCOUNTER — Ambulatory Visit: Payer: BC Managed Care – PPO | Admitting: Family Medicine

## 2022-05-09 VITALS — BP 126/75 | HR 63 | Ht 68.0 in | Wt 160.0 lb

## 2022-05-09 DIAGNOSIS — E538 Deficiency of other specified B group vitamins: Secondary | ICD-10-CM | POA: Diagnosis not present

## 2022-05-09 DIAGNOSIS — M5441 Lumbago with sciatica, right side: Secondary | ICD-10-CM | POA: Insufficient documentation

## 2022-05-09 DIAGNOSIS — M2550 Pain in unspecified joint: Secondary | ICD-10-CM

## 2022-05-09 DIAGNOSIS — R413 Other amnesia: Secondary | ICD-10-CM

## 2022-05-09 NOTE — Progress Notes (Signed)
      Established patient visit   Patient: Heidi [name]   DOB: 04-24-1975   47 y.o. Female  MRN: [id number] Visit Date: 05/09/2022  Today's healthcare provider: Mila Merry, MD   No chief complaint on file.  Subjective    HPI  Pt stated-lower back pain radiated lower right leg--worse especially when sitting then stand--started about 3 weeks ago, but was much more severe over night a week ago. 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 She reports has generalized arthralgias and myalgias that continues to worsen over time. She has appointment with new rheumatologist at [facility] on August 22. She is also noted to have history of vitamin B12 deficiency, but has been off of b12 supplements for awhile. She states she feels her memory and focus are progressively deteriorating.   Medications: Outpatient Medications Prior to Visit  Medication Sig   diclofenac (VOLTAREN) 75 MG EC tablet TAKE 1 TABLET BY MOUTH TWICE A DAY AS NEEDED   gabapentin (NEURONTIN) 300 MG capsule TAKE 1-2 CAPSULES (300-600 MG TOTAL) BY MOUTH AT BEDTIME.   lamoTRIgine (LAMICTAL) 200 MG tablet Take by mouth every morning.   valACYclovir (VALTREX) 1000 MG tablet TAKE 1 TABLET BY MOUTH EVERY DAY   [DISCONTINUED] eszopiclone (LUNESTA) 1 MG TABS tablet Take 1 mg by mouth at bedtime.   No facility-administered medications prior to visit.         Objective    BP 126/75 (BP Location: Right Arm, Patient Position: Sitting, Cuff Size: Normal)   Pulse 63   Ht 5\' 8"  (1.727 m)   Wt 160 lb (72.6 kg)   SpO2 97%   BMI 24.33 kg/m    Physical Exam   General: Appearance:    Well developed, well nourished female in no acute distress  Eyes:    PERRL, conjunctiva/corneas clear, EOM's intact       Lungs:     Clear to auscultation bilaterally, respirations unlabored  Heart:    Normal heart rate. Normal rhythm. No murmurs, rubs, or gallops.     MS:   All extremities are intact.    Neurologic:   Awake, alert, oriented x 3. No apparent focal neurological defect.         Assessment & Plan     1. Acute right-sided low back pain with right-sided sciatica  - DG Lumbar Spine Complete; Future  2. Arthralgia, unspecified joint  - Sed Rate (ESR) - ANA Direct w/Reflex if Positive - Rheumatoid Factor - CYCLIC CITRUL PEPTIDE ANTIBODY, IGG/IGA - C-reactive protein  Appointment with new neurologist is scheduled 8-22 at [facility].   3. Memory change  - VITAMIN D 25 Hydroxy (Vit-D Deficiency, Fractures) - CBC Consider neurology referral if labs are normal.   4. B12 deficiency Currently not on b12 supplements.  - Vitamin B12 - CBC      The entirety of the information documented in the History of Present Illness, Review of Systems and Physical Exam were personally obtained by me. Portions of this information were initially documented by the CMA and reviewed by me for thoroughness and accuracy.     Mila Merry, MD  [facility] [phone] (phone) [phone] (fax)  [facility]

## 2022-05-10 NOTE — Telephone Encounter (Signed)
Seen in office visit 05/09/2022

## 2022-05-11 ENCOUNTER — Ambulatory Visit: Payer: BC Managed Care – PPO | Admitting: Psychology

## 2022-05-11 DIAGNOSIS — F4321 Adjustment disorder with depressed mood: Secondary | ICD-10-CM

## 2022-05-11 DIAGNOSIS — Z634 Disappearance and death of family member: Secondary | ICD-10-CM | POA: Diagnosis not present

## 2022-05-11 DIAGNOSIS — F4323 Adjustment disorder with mixed anxiety and depressed mood: Secondary | ICD-10-CM

## 2022-05-11 DIAGNOSIS — F4310 Post-traumatic stress disorder, unspecified: Secondary | ICD-10-CM | POA: Diagnosis not present

## 2022-05-11 LAB — CBC
Hematocrit: 31.9 % — ABNORMAL LOW (ref 34.0–46.6)
MCV: 98 fL — ABNORMAL HIGH (ref 79–97)

## 2022-05-12 LAB — CBC
Hemoglobin: 11.4 g/dL (ref 11.1–15.9)
MCH: 35 pg — ABNORMAL HIGH (ref 26.6–33.0)
MCHC: 35.7 g/dL (ref 31.5–35.7)
Platelets: 347 10*3/uL (ref 150–450)
RBC: 3.26 x10E6/uL — ABNORMAL LOW (ref 3.77–5.28)
RDW: 11.3 % — ABNORMAL LOW (ref 11.7–15.4)
WBC: 6.1 10*3/uL (ref 3.4–10.8)

## 2022-05-12 LAB — VITAMIN D 25 HYDROXY (VIT D DEFICIENCY, FRACTURES): Vit D, 25-Hydroxy: 39.9 ng/mL (ref 30.0–100.0)

## 2022-05-12 LAB — C-REACTIVE PROTEIN: CRP: 5 mg/L (ref 0–10)

## 2022-05-12 LAB — CYCLIC CITRUL PEPTIDE ANTIBODY, IGG/IGA: Cyclic Citrullin Peptide Ab: 3 units (ref 0–19)

## 2022-05-12 LAB — RHEUMATOID FACTOR: Rheumatoid fact SerPl-aCnc: <10 IU/mL (ref <14.0)

## 2022-05-12 LAB — VITAMIN B12: Vitamin B-12: 183 pg/mL — ABNORMAL LOW (ref 232–1245)

## 2022-05-12 LAB — SEDIMENTATION RATE: Sed Rate: 33 mm/hr — ABNORMAL HIGH (ref 0–32)

## 2022-05-12 LAB — ANA W/REFLEX IF POSITIVE: Anti Nuclear Antibody (ANA): NEGATIVE

## 2022-05-12 NOTE — Progress Notes (Signed)
[facility] Counselor/Therapist Progress Note  Patient ID: Heidi [name], MRN: [id number],    Date:  05/11/2022  Time Spent: 60 minutes  Treatment Type: Individual Therapy  Reported Symptoms: sadness, irritability,   Mental Status Exam: Appearance:  Casual     Behavior: Appropriate  Motor: Normal  Speech/Language:  Normal Rate  Affect:  blunted  Mood:  normal  Thought process: normal  Thought content:   WNL  Sensory/Perceptual disturbances:   WNL  Orientation: oriented to person, place, time/date, and situation  Attention: Good  Concentration: Good  Memory: WNL  Fund of knowledge:  Good  Insight:   Good  Judgment:  Good  Impulse Control: Good   Risk Assessment: Danger to Self:  No Self-injurious Behavior: No Danger to Others: No Duty to Warn:no Physical Aggression / Violence:No  Access to Firearms a concern: No  Gang Involvement:No   Subjective: The patient attended a face to face individual therapy session in the office today.  The patient presents with a blunted affect and mood is a little frustrated.  The patient reports that she is having some health issues and some problems with her back and sciatica.  The patient reports that she is really frustrated at work and we talked about the connection between her stress level and her physical health.  She has tried to find other employment but is not able to find anything at present.  She says that she thought about applying for a job at [employer].  We talked about her applying and that there was no set rules that she had to take it if they offered it to her but that it might be an option for her.  Encouraged the patient and provided supportive therapy today.  Diagnosis:PTSD (post-traumatic stress disorder)  Grief at loss of child  Adjustment disorder with mixed anxiety and depressed mood  Plan: Please see plan inClient Abilities/Strengths  Intelligent, ability for insight, motivated  Client  Treatment Preferences  Outpatient Individual therapy  Client Statement of Needs  "I need some therapy to deal with my trauma and my grief"  Treatment Level  Outpatient Individual therapy  Symptoms  Experiences disturbances in sleep.: (Status: improved). Experiences  disturbing and persistent thoughts, images, and/or perceptions of the traumatic event.: (Status: improved). Has been exposed to a traumatic event involving actual or perceived  threat of death or serious injury.: (Status: maintained). Impairment in social,  occupational, or other areas of functioning.:  (Status: improved). Reports  difficulty concentrating as well as feelings of guilt.: (Status: improved).  Thoughts dominated by loss coupled with poor concentration, tearful spells, and confusion about the  future.: (Status: Improved).  Problems Addressed  Grief / Loss Unresolved, Posttraumatic Stress Disorder (PTSD)  Goals 1. Begin a healthy grieving process around the loss. Objective Begin verbalizing feelings associated with the loss. Target Date: 2022-09-24 Frequency: Weekly Progress: 50 Modality: individual Related Interventions 1. Assist the client in identifying and expressing feelings connected with his/her loss. Objective Tell in detail the story of the current loss that is triggering symptoms. Target Date: 2022-09-24 Frequency: Weekly Progress: 60 Modality: individual Related Interventions 1. Create a safe environment for disclosure and actively build the level of trust with the client in  individual sessions through consistent eye contact, active listening, unconditional positive  regard, and warm acceptance to help increase his/her ability to identify and express thoughts  and feelings. 2. Use empathy, compassion, and support, allowing the client to tell in detail the story of his/her  recent  loss. 2. Returns to the level of psychological functioning prior to exposure to  the traumatic  event. Objective Participate in Eye Movement Desensitization and Reprocessing (EMDR) to reduce emotional distress  related to traumatic thoughts, feelings, and images. Target Date: 2022-09-24 Frequency: Weekly Progress: 0 Modality: individual Related Interventions 1. Utilize Eye Movement Desensitization and Reprocessing (EMDR) to reduce the client's  emotional reactivity to the traumatic event and reduce PTSD symptoms. Objective Learn and implement guided self-dialogue to manage thoughts, feelings, and urges brought on by  encounters with trauma-related situations. Target Date: 2022-09-24 Frequency: Weekly Progress: 50 Modality: individual  Related Interventions 1. Teach the client a guided self-dialogue procedure in which he/she learns to recognize  maladaptive self-talk, challenges its biases, copes with engendered feelings, overcomes  avoidance, and reinforces his/her accomplishments; review and reinforce progress, problemsolve obstacles. Diagnosis Axis  F43.10 (Posttraumatic stress disorder) - Posttraumatic Stress  Disorder  Conditions For Discharge Achievement of treatment goals and objectives  The patient approved this plan and is currently maintaining progress.    Hila Bolding G Brena Windsor, LCSW                                                                                                               Cadin Luka G Bindi Klomp, LCSW

## 2022-05-13 ENCOUNTER — Encounter: Payer: Self-pay | Admitting: Family Medicine

## 2022-05-18 ENCOUNTER — Encounter: Payer: Self-pay | Admitting: Family Medicine

## 2022-05-18 ENCOUNTER — Other Ambulatory Visit: Payer: Self-pay | Admitting: Family Medicine

## 2022-05-18 DIAGNOSIS — M5412 Radiculopathy, cervical region: Secondary | ICD-10-CM

## 2022-05-25 ENCOUNTER — Ambulatory Visit: Payer: BC Managed Care – PPO | Admitting: Psychology

## 2022-05-25 DIAGNOSIS — F4321 Adjustment disorder with depressed mood: Secondary | ICD-10-CM

## 2022-05-25 DIAGNOSIS — F4310 Post-traumatic stress disorder, unspecified: Secondary | ICD-10-CM

## 2022-05-25 DIAGNOSIS — Z634 Disappearance and death of family member: Secondary | ICD-10-CM | POA: Diagnosis not present

## 2022-05-26 NOTE — Progress Notes (Signed)
[facility] Counselor/Therapist Progress Note  Patient ID: Tirsa Sabir, MRN: [id number],    Date:  05/25/2022  Time Spent: 60 minutes  Treatment Type: Individual Therapy  Reported Symptoms: sadness, irritability,   Mental Status Exam: Appearance:  Casual     Behavior: Appropriate  Motor: Normal  Speech/Language:  Normal Rate  Affect:  blunted  Mood:  Irritable and sad  Thought process: normal  Thought content:   WNL  Sensory/Perceptual disturbances:   WNL  Orientation: oriented to person, place, time/date, and situation  Attention: Good  Concentration: Good  Memory: WNL  Fund of knowledge:  Good  Insight:   Good  Judgment:  Good  Impulse Control: Good   Risk Assessment: Danger to Self:  No Self-injurious Behavior: No Danger to Others: No Duty to Warn:no Physical Aggression / Violence:No  Access to Firearms a concern: No  Gang Involvement:No   Subjective: The patient attended a face to face individual therapy session in the office today.  The patient presents with a blunted affect and mood is irritable and sad.  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  She has been wanting to leave that job for quite some time but she wanted to do that on her terms and now her job has been eliminated.  She also reports that she is having some physical ailments and some issues with her back and neck.  In addition to that it was Mother's Day over the weekend and she was very sad about the loss of her son.  Provided validation and support today and gave her some encouragement.  We will plan to do some problem solving and hopefully come up with a plan for her to be able to find some sort of employment that would help her feel secure and that she might like. Diagnosis:PTSD (post-traumatic stress disorder)  Grief at loss of child  Plan: Please see plan inClient Abilities/Strengths   Intelligent, ability for insight, motivated  Client Treatment Preferences  Outpatient Individual therapy  Client Statement of Needs  "I need some therapy to deal with my trauma and my grief"  Treatment Level  Outpatient Individual therapy  Symptoms  Experiences disturbances in sleep.: (Status: improved). Experiences  disturbing and persistent thoughts, images, and/or perceptions of the traumatic event.: (Status: improved). Has been exposed to a traumatic event involving actual or perceived  threat of death or serious injury.: (Status: maintained). Impairment in social,  occupational, or other areas of functioning.:  (Status: improved). Reports  difficulty concentrating as well as feelings of guilt.: (Status: improved).  Thoughts dominated by loss coupled with poor concentration, tearful spells, and confusion about the  future.: (Status: Improved).  Problems Addressed  Grief / Loss Unresolved, Posttraumatic Stress Disorder (PTSD)  Goals 1. Begin a healthy grieving process around the loss. Objective Begin verbalizing feelings associated with the loss. Target Date: 2022-09-24 Frequency: Weekly Progress: 50 Modality: individual Related Interventions 1. Assist the client in identifying and expressing feelings connected with his/her loss. Objective Tell in detail the story of the current loss that is triggering symptoms. Target Date: 2022-09-24 Frequency: Weekly Progress: 60 Modality: individual Related Interventions 1. Create a safe environment for disclosure and actively build the level of trust with the client in  individual sessions through consistent eye contact, active listening, unconditional positive  regard, and warm acceptance to help increase his/her ability to identify and express thoughts  and feelings. 2. Use empathy,  compassion, and support, allowing the client to tell in detail the story of his/her  recent loss. 2. Returns to the level of psychological functioning  prior to exposure to  the traumatic event. Objective Participate in Eye Movement Desensitization and Reprocessing (EMDR) to reduce emotional distress  related to traumatic thoughts, feelings, and images. Target Date: 2022-09-24 Frequency: Weekly Progress: 0 Modality: individual Related Interventions 1. Utilize Eye Movement Desensitization and Reprocessing (EMDR) to reduce the client's  emotional reactivity to the traumatic event and reduce PTSD symptoms. Objective Learn and implement guided self-dialogue to manage thoughts, feelings, and urges brought on by  encounters with trauma-related situations. Target Date: 2022-09-24 Frequency: Weekly Progress: 50 Modality: individual  Related Interventions 1. Teach the client a guided self-dialogue procedure in which he/she learns to recognize  maladaptive self-talk, challenges its biases, copes with engendered feelings, overcomes  avoidance, and reinforces his/her accomplishments; review and reinforce progress, problemsolve obstacles. Diagnosis Axis  F43.10 (Posttraumatic stress disorder) - Posttraumatic Stress  Disorder  Conditions For Discharge Achievement of treatment goals and objectives  The patient approved this plan and is currently maintaining progress.    Jenne Sellinger G Kennia Vanvorst, LCSW

## 2022-06-08 ENCOUNTER — Ambulatory Visit: Payer: BC Managed Care – PPO | Admitting: Psychology

## 2022-06-16 ENCOUNTER — Other Ambulatory Visit: Payer: Self-pay

## 2022-06-16 ENCOUNTER — Encounter: Payer: Self-pay | Admitting: Family Medicine

## 2022-06-16 DIAGNOSIS — E538 Deficiency of other specified B group vitamins: Secondary | ICD-10-CM

## 2022-06-22 ENCOUNTER — Ambulatory Visit (INDEPENDENT_AMBULATORY_CARE_PROVIDER_SITE_OTHER): Payer: BC Managed Care – PPO | Admitting: Psychology

## 2022-06-22 DIAGNOSIS — F4310 Post-traumatic stress disorder, unspecified: Secondary | ICD-10-CM | POA: Diagnosis not present

## 2022-06-22 DIAGNOSIS — Z634 Disappearance and death of family member: Secondary | ICD-10-CM

## 2022-06-22 DIAGNOSIS — F4321 Adjustment disorder with depressed mood: Secondary | ICD-10-CM

## 2022-06-22 DIAGNOSIS — F4323 Adjustment disorder with mixed anxiety and depressed mood: Secondary | ICD-10-CM

## 2022-06-22 NOTE — Progress Notes (Signed)
[facility] Counselor/Therapist Progress Note  Patient ID: Heidi [name], MRN: [id number],    Date:  06/22/2022  Time Spent: 60 minutes  Treatment Type: Individual Therapy  Reported Symptoms: sadness, irritability,   Mental Status Exam: Appearance:  Casual     Behavior: Appropriate  Motor: Normal  Speech/Language:  Normal Rate  Affect:  blunted  Mood:  Irritable and sad  Thought process: normal  Thought content:   WNL  Sensory/Perceptual disturbances:   WNL  Orientation: oriented to person, place, time/date, and situation  Attention: Good  Concentration: Good  Memory: WNL  Fund of knowledge:  Good  Insight:   Good  Judgment:  Good  Impulse Control: Good   Risk Assessment: Danger to Self:  No Self-injurious Behavior: No Danger to Others: No Duty to Warn:no Physical Aggression / Violence:No  Access to Firearms a concern: No  Gang Involvement:No   Subjective: The patient attended a face to face individual therapy session via video  visit.  The patient gave verbal consent for the session to be held on caregility and the patient is aware of the limitations of telehealth.  The patient was in her backyard alone and the therapist was in the office.  The patient presents as very frustrated today because she is having her neck and back pain.  She reports that she cannot see a neurologist until July 11.  I encouraged her to call the office again and to see if she can get in any earlier.  She is in a good deal of pain and that is why she did not drive to the office today.  We talked about the upcoming date of her son's death and the conversation that she also had with her other son about missing his brother.  The patient seems to be managing things okay but did have some difficulty at Mother's Day related to the death of her son.  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  Provided cognitive  behavioral therapy today to help the patient cope with her chronic pain issues and also provided some problem solving.  Interventions:  CBT, problem solving, insight oriented, interpersonal  Diagnosis:PTSD (post-traumatic stress disorder)  Grief at loss of child  Adjustment disorder with mixed anxiety and depressed mood  Plan: Please see plan inClient Abilities/Strengths  Intelligent, ability for insight, motivated  Client Treatment Preferences  Outpatient Individual therapy  Client Statement of Needs  "I need some therapy to deal with my trauma and my grief"  Treatment Level  Outpatient Individual therapy  Symptoms  Experiences disturbances in sleep.: (Status: improved). Experiences  disturbing and persistent thoughts, images, and/or perceptions of the traumatic event.: (Status: improved). Has been exposed to a traumatic event involving actual or perceived  threat of death or serious injury.: (Status: maintained). Impairment in social,  occupational, or other areas of functioning.:  (Status: improved). Reports  difficulty concentrating as well as feelings of guilt.: (Status: improved).  Thoughts dominated by loss coupled with poor concentration, tearful spells, and confusion about the  future.: (Status: Improved).  Problems Addressed  Grief / Loss Unresolved, Posttraumatic Stress Disorder (PTSD)  Goals 1. Begin a healthy grieving process around the loss. Objective Begin verbalizing feelings associated with the loss. Target Date: 2022-09-24 Frequency:  BiWeekly Progress: 50 Modality: individual Related Interventions 1. Assist the client in identifying and expressing feelings connected with his/her loss. Objective Tell in detail the story of  the current loss that is triggering symptoms. Target Date: 2022-09-24 Frequency: Bi Weekly Progress: 60 Modality: individual Related Interventions 1. Create a safe environment for disclosure and actively build the level of trust with the  client in  individual sessions through consistent eye contact, active listening, unconditional positive  regard, and warm acceptance to help increase his/her ability to identify and express thoughts  and feelings. 2. Use empathy, compassion, and support, allowing the client to tell in detail the story of his/her  recent loss. 2. Returns to the level of psychological functioning prior to exposure to  the traumatic event. Objective Participate in Eye Movement Desensitization and Reprocessing (EMDR) to reduce emotional distress  related to traumatic thoughts, feelings, and images. Target Date: 2022-09-24 Frequency: BiWeekly Progress: 0 Modality: individual Related Interventions 1. Utilize Eye Movement Desensitization and Reprocessing (EMDR) to reduce the client's  emotional reactivity to the traumatic event and reduce PTSD symptoms. Objective Learn and implement guided self-dialogue to manage thoughts, feelings, and urges brought on by  encounters with trauma-related situations. Target Date: 2022-09-24 Frequency: BiWeekly Progress: 50 Modality: individual  Related Interventions 1. Teach the client a guided self-dialogue procedure in which he/she learns to recognize  maladaptive self-talk, challenges its biases, copes with engendered feelings, overcomes  avoidance, and reinforces his/her accomplishments; review and reinforce progress, problemsolve obstacles. Diagnosis Axis  F43.10 (Posttraumatic stress disorder) - Posttraumatic Stress  Disorder  Conditions For Discharge Achievement of treatment goals and objectives  The patient approved this plan and is currently maintaining progress.    Navina Wohlers G Alsie Younes, LCSW                                                                                                                  Ola Raap G Giovanna Kemmerer, LCSW

## 2022-06-29 LAB — VITAMIN B12: Vitamin B-12: 804 pg/mL (ref 232–1245)

## 2022-07-06 ENCOUNTER — Ambulatory Visit (INDEPENDENT_AMBULATORY_CARE_PROVIDER_SITE_OTHER): Payer: BC Managed Care – PPO | Admitting: Psychology

## 2022-07-06 DIAGNOSIS — Z634 Disappearance and death of family member: Secondary | ICD-10-CM

## 2022-07-06 DIAGNOSIS — F4310 Post-traumatic stress disorder, unspecified: Secondary | ICD-10-CM | POA: Diagnosis not present

## 2022-07-06 DIAGNOSIS — F4323 Adjustment disorder with mixed anxiety and depressed mood: Secondary | ICD-10-CM

## 2022-07-06 DIAGNOSIS — F4321 Adjustment disorder with depressed mood: Secondary | ICD-10-CM | POA: Diagnosis not present

## 2022-07-07 NOTE — Progress Notes (Signed)
[facility] Counselor/Therapist Progress Note  Patient ID: Heidi [name], MRN: [id number],    Date:  07/06/2022  Time In:  5:05 Time out: 6:05  Treatment Type: Individual Therapy  Reported Symptoms: sadness, irritability,   Mental Status Exam: Appearance:  Casual     Behavior: Appropriate  Motor: Normal  Speech/Language:  Normal Rate  Affect:  blunted  Mood:  pleasant  Thought process: normal  Thought content:   WNL  Sensory/Perceptual disturbances:   WNL  Orientation: oriented to person, place, time/date, and situation  Attention: Good  Concentration: Good  Memory: WNL  Fund of knowledge:  Good  Insight:   Good  Judgment:  Good  Impulse Control: Good   Risk Assessment: Danger to Self:  No Self-injurious Behavior: No Danger to Others: No Duty to Warn:no Physical Aggression / Violence:No  Access to Firearms a concern: No  Gang Involvement:No   Subjective: The patient attended a face to face individual therapy session in the office today.  The patient presents with a blunted affect and her mood is pleasant.  She reports that she is not having as many problems with her shoulder and her neck.  The patient talked about feeling some grief over her son and really having a realization that he has been 2 years now and is more vocal  that she is never going to get to hug him again.  We talked about this grief being very normal.  In addition the patient has signed up for school starting in the fall to get her behavioral analysis certificate.  The patient is still looking for employment and is going to move to the new school but does not feel like she really wants to stay in the public school system.  We talked about that.  Provided some grief counseling today and also some problem solving.  Interventions:  CBT, problem solving, insight oriented, interpersonal  Diagnosis:PTSD (post-traumatic stress disorder)  Grief at loss of child  Adjustment disorder with mixed  anxiety and depressed mood  Plan: Please see plan inClient Abilities/Strengths  Intelligent, ability for insight, motivated  Client Treatment Preferences  Outpatient Individual therapy  Client Statement of Needs  "I need some therapy to deal with my trauma and my grief"  Treatment Level  Outpatient Individual therapy  Symptoms  Experiences disturbances in sleep.: (Status: improved). Experiences  disturbing and persistent thoughts, images, and/or perceptions of the traumatic event.: (Status: improved). Has been exposed to a traumatic event involving actual or perceived  threat of death or serious injury.: (Status: maintained). Impairment in social,  occupational, or other areas of functioning.:  (Status: improved). Reports  difficulty concentrating as well as feelings of guilt.: (Status: improved).  Thoughts dominated by loss coupled with poor concentration, tearful spells, and confusion about the  future.: (Status: Improved).  Problems Addressed  Grief / Loss Unresolved, Posttraumatic Stress Disorder (PTSD)  Goals 1. Begin a healthy grieving process around the loss. Objective Begin verbalizing feelings associated with the loss. Target Date: 2022-09-24 Frequency:  BiWeekly Progress: 50 Modality: individual Related Interventions 1. Assist the client in identifying and expressing feelings connected with his/her loss. Objective Tell in detail the story of the current loss that is triggering symptoms. Target Date: 2022-09-24 Frequency: Bi Weekly Progress: 60 Modality: individual Related Interventions 1. Create a safe environment for disclosure and actively build the level of trust with the client in  individual sessions through consistent eye contact, active listening, unconditional positive  regard, and warm acceptance to help increase his/her  ability to identify and express thoughts  and feelings. 2. Use empathy, compassion, and support, allowing the client to tell in detail the  story of his/her  recent loss. 2. Returns to the level of psychological functioning prior to exposure to  the traumatic event. Objective Participate in Eye Movement Desensitization and Reprocessing (EMDR) to reduce emotional distress  related to traumatic thoughts, feelings, and images. Target Date: 2022-09-24 Frequency: BiWeekly Progress: 0 Modality: individual Related Interventions 1. Utilize Eye Movement Desensitization and Reprocessing (EMDR) to reduce the client's  emotional reactivity to the traumatic event and reduce PTSD symptoms. Objective Learn and implement guided self-dialogue to manage thoughts, feelings, and urges brought on by  encounters with trauma-related situations. Target Date: 2022-09-24 Frequency: BiWeekly Progress: 50 Modality: individual  Related Interventions 1. Teach the client a guided self-dialogue procedure in which he/she learns to recognize  maladaptive self-talk, challenges its biases, copes with engendered feelings, overcomes  avoidance, and reinforces his/her accomplishments; review and reinforce progress, problemsolve obstacles. Diagnosis Axis  F43.10 (Posttraumatic stress disorder) - Posttraumatic Stress  Disorder  Conditions For Discharge Achievement of treatment goals and objectives  The patient approved this plan and is currently maintaining progress.    Bryar Dahms G Hillery Bhalla, LCSW

## 2022-07-20 ENCOUNTER — Ambulatory Visit: Payer: BC Managed Care – PPO | Admitting: Psychology

## 2022-07-20 DIAGNOSIS — F4321 Adjustment disorder with depressed mood: Secondary | ICD-10-CM

## 2022-07-20 DIAGNOSIS — F4310 Post-traumatic stress disorder, unspecified: Secondary | ICD-10-CM | POA: Diagnosis not present

## 2022-07-20 DIAGNOSIS — Z634 Disappearance and death of family member: Secondary | ICD-10-CM | POA: Diagnosis not present

## 2022-07-21 NOTE — Progress Notes (Signed)
[facility] Counselor/Therapist Progress Note  Patient ID: Heidi [name], MRN: [id number],    Date:  07/20/2022  Time Spent:  60 minutes  Time In:  5:03 Time out: 6:05  Treatment Type: Individual Therapy  Reported Symptoms: sadness, irritability,   Mental Status Exam: Appearance:  Casual     Behavior: Appropriate  Motor: Normal  Speech/Language:  Normal Rate  Affect:  blunted  Mood:  pleasant  Thought process: normal  Thought content:   WNL  Sensory/Perceptual disturbances:   WNL  Orientation: oriented to person, place, time/date, and situation  Attention: Good  Concentration: Good  Memory: WNL  Fund of knowledge:  Good  Insight:   Good  Judgment:  Good  Impulse Control: Good   Risk Assessment: Danger to Self:  No Self-injurious Behavior: No Danger to Others: No Duty to Warn:no Physical Aggression / Violence:No  Access to Firearms a concern: No  Gang Involvement:No   Subjective: The patient attended a face to face individual therapy session in the office today.  The patient presents with a blunted affect and her mood is pleasant.  The patient reports that she feels like she is doing better physically but she has a neurology appointment tomorrow.  She says that she is going to go ahead and keep it so that she can talk with her neurologist about the pain that she has.  The patient talked the majority of the session today about her anxiety related to the upcoming election.  She talked about all the political issues that are really causing her concern.  I encouraged her to work with herself on not watching as much news if possible.  I explained that I understood that she wanted to be informed but I also explained the correlation between watching that and her anxiety level.  The patient is very aware that the only thing she really has the opportunity to do is to vote.  We will continue to process this as needed to help her relieve some of her  stress.  Interventions:  CBT, problem solving, insight oriented, interpersonal  Diagnosis:PTSD (post-traumatic stress disorder)  Grief at loss of child  Plan: Please see plan inClient Abilities/Strengths  Intelligent, ability for insight, motivated  Client Treatment Preferences  Outpatient Individual therapy  Client Statement of Needs  "I need some therapy to deal with my trauma and my grief"  Treatment Level  Outpatient Individual therapy  Symptoms  Experiences disturbances in sleep.: (Status: improved). Experiences  disturbing and persistent thoughts, images, and/or perceptions of the traumatic event.: (Status: improved). Has been exposed to a traumatic event involving actual or perceived  threat of death or serious injury.: (Status: maintained). Impairment in social,  occupational, or other areas of functioning.:  (Status: improved). Reports  difficulty concentrating as well as feelings of guilt.: (Status: improved).  Thoughts dominated by loss coupled with poor concentration, tearful spells, and confusion about the  future.: (Status: Improved).  Problems Addressed  Grief / Loss Unresolved, Posttraumatic Stress Disorder (PTSD)  Goals 1. Begin a healthy grieving process around the loss. Objective Begin verbalizing feelings associated with the loss. Target Date: 2022-09-24 Frequency:  BiWeekly Progress: 50 Modality: individual Related Interventions 1. Assist the client in identifying and expressing feelings connected with his/her loss. Objective Tell in detail the story of the current loss that is triggering symptoms. Target Date: 2022-09-24 Frequency: Bi Weekly Progress: 60 Modality: individual Related Interventions 1. Create a safe environment for disclosure and actively build the level of trust with the client  in  individual sessions through consistent eye contact, active listening, unconditional positive  regard, and warm acceptance to help increase his/her ability to  identify and express thoughts  and feelings. 2. Use empathy, compassion, and support, allowing the client to tell in detail the story of his/her  recent loss. 2. Returns to the level of psychological functioning prior to exposure to  the traumatic event. Objective Participate in Eye Movement Desensitization and Reprocessing (EMDR) to reduce emotional distress  related to traumatic thoughts, feelings, and images. Target Date: 2022-09-24 Frequency: BiWeekly Progress: 0 Modality: individual Related Interventions 1. Utilize Eye Movement Desensitization and Reprocessing (EMDR) to reduce the client's  emotional reactivity to the traumatic event and reduce PTSD symptoms. Objective Learn and implement guided self-dialogue to manage thoughts, feelings, and urges brought on by  encounters with trauma-related situations. Target Date: 2022-09-24 Frequency: BiWeekly Progress: 50 Modality: individual  Related Interventions 1. Teach the client a guided self-dialogue procedure in which he/she learns to recognize  maladaptive self-talk, challenges its biases, copes with engendered feelings, overcomes  avoidance, and reinforces his/her accomplishments; review and reinforce progress, problemsolve obstacles. Diagnosis Axis  F43.10 (Posttraumatic stress disorder) - Posttraumatic Stress  Disorder  Conditions For Discharge Achievement of treatment goals and objectives  The patient approved this plan and is currently maintaining progress.    Heidi [name] Heidi Silfies, LCSW

## 2022-07-26 ENCOUNTER — Other Ambulatory Visit: Payer: Self-pay | Admitting: Neurology

## 2022-07-26 DIAGNOSIS — M5412 Radiculopathy, cervical region: Secondary | ICD-10-CM

## 2022-07-31 ENCOUNTER — Ambulatory Visit
Admission: RE | Admit: 2022-07-31 | Discharge: 2022-07-31 | Disposition: A | Discharge status: Home / Self Care | Payer: BC Managed Care – PPO | Source: Ambulatory Visit | Attending: Neurology | Admitting: Neurology

## 2022-07-31 DIAGNOSIS — M5412 Radiculopathy, cervical region: Secondary | ICD-10-CM | POA: Diagnosis not present

## 2022-08-03 ENCOUNTER — Ambulatory Visit (INDEPENDENT_AMBULATORY_CARE_PROVIDER_SITE_OTHER): Payer: BC Managed Care – PPO | Admitting: Psychology

## 2022-08-03 DIAGNOSIS — F4310 Post-traumatic stress disorder, unspecified: Secondary | ICD-10-CM | POA: Diagnosis not present

## 2022-08-03 DIAGNOSIS — Z634 Disappearance and death of family member: Secondary | ICD-10-CM | POA: Diagnosis not present

## 2022-08-03 DIAGNOSIS — F4321 Adjustment disorder with depressed mood: Secondary | ICD-10-CM | POA: Diagnosis not present

## 2022-08-04 NOTE — Progress Notes (Signed)
[facility] Counselor/Therapist Progress Note  Patient ID: Heidi [name], MRN: [id number],    Date:  08/03/2022  Time Spent:  60 minutes  Time In:  5:03 Time out: 6:05  Treatment Type: Individual Therapy  Reported Symptoms: sadness, irritability,   Mental Status Exam: Appearance:  Casual     Behavior: Appropriate  Motor: Normal  Speech/Language:  Normal Rate  Affect:  blunted  Mood:  pleasant  Thought process: normal  Thought content:   WNL  Sensory/Perceptual disturbances:   WNL  Orientation: oriented to person, place, time/date, and situation  Attention: Good  Concentration: Good  Memory: WNL  Fund of knowledge:  Good  Insight:   Good  Judgment:  Good  Impulse Control: Good   Risk Assessment: Danger to Self:  No Self-injurious Behavior: No Danger to Others: No Duty to Warn:no Physical Aggression / Violence:No  Access to Firearms a concern: No  Gang Involvement:No   Subjective: The patient attended a face to face individual therapy session in the office today.  The patient presents with a blunted affect and her mood is pleasant.  The patient reports that she had had an MRI after her neurology appointment.  She has not heard the results yet.  The patient also talked about applying for a job and asked him some advice on how to handle and interview tomorrow.  We talked about what question she could ask in when to approach salary  and those kinds of things.  In addition the patient talked about having a realization that she does have racing thoughts and we talked briefly about that being a symptom of bipolar disorder but she seems to be functioning okay right now and there is nothing to do to worry with that.  The patient is currently on medications and is managed well.  We briefly touched on the anniversary date of her son's death coming up on August 30, 2022. Interventions:  CBT, problem solving, insight oriented, interpersonal  Diagnosis:PTSD (post-traumatic  stress disorder)  Grief at loss of child  Plan: Please see plan inClient Abilities/Strengths  Intelligent, ability for insight, motivated  Client Treatment Preferences  Outpatient Individual therapy  Client Statement of Needs  "I need some therapy to deal with my trauma and my grief"  Treatment Level  Outpatient Individual therapy  Symptoms  Experiences disturbances in sleep.: (Status: improved). Experiences  disturbing and persistent thoughts, images, and/or perceptions of the traumatic event.: (Status: improved). Has been exposed to a traumatic event involving actual or perceived  threat of death or serious injury.: (Status: maintained). Impairment in social,  occupational, or other areas of functioning.:  (Status: improved). Reports  difficulty concentrating as well as feelings of guilt.: (Status: improved).  Thoughts dominated by loss coupled with poor concentration, tearful spells, and confusion about the  future.: (Status: Improved).  Problems Addressed  Grief / Loss Unresolved, Posttraumatic Stress Disorder (PTSD)  Goals 1. Begin a healthy grieving process around the loss. Objective Begin verbalizing feelings associated with the loss. Target Date: 2022-09-24 Frequency:  BiWeekly Progress: 60 Modality: individual Related Interventions 1. Assist the client in identifying and expressing feelings connected with his/her loss. Objective Tell in detail the story of the current loss that is triggering symptoms. Target Date: 2022-09-24 Frequency: Bi Weekly Progress: 60 Modality: individual Related Interventions 1. Create a safe environment for disclosure and actively build the level of trust with the client in  individual sessions through consistent eye contact, active listening, unconditional positive  regard, and warm acceptance to  help increase his/her ability to identify and express thoughts  and feelings. 2. Use empathy, compassion, and support, allowing the client to tell  in detail the story of his/her  recent loss. 2. Returns to the level of psychological functioning prior to exposure to  the traumatic event. Objective Participate in Eye Movement Desensitization and Reprocessing (EMDR) to reduce emotional distress  related to traumatic thoughts, feelings, and images. Target Date: 2022-09-24 Frequency: BiWeekly Progress: 0 Modality: individual Related Interventions 1. Utilize Eye Movement Desensitization and Reprocessing (EMDR) to reduce the client's  emotional reactivity to the traumatic event and reduce PTSD symptoms. Objective Learn and implement guided self-dialogue to manage thoughts, feelings, and urges brought on by  encounters with trauma-related situations. Target Date: 2022-09-24 Frequency: BiWeekly Progress: 60 Modality: individual  Related Interventions 1. Teach the client a guided self-dialogue procedure in which he/she learns to recognize  maladaptive self-talk, challenges its biases, copes with engendered feelings, overcomes  avoidance, and reinforces his/her accomplishments; review and reinforce progress, problemsolve obstacles. Diagnosis Axis  F43.10 (Posttraumatic stress disorder) - Posttraumatic Stress  Disorder  Conditions For Discharge Achievement of treatment goals and objectives  The patient approved this plan and is currently maintaining progress.    Heidi [name] G Tersea Aulds, LCSW

## 2022-08-11 ENCOUNTER — Other Ambulatory Visit: Payer: Self-pay | Admitting: Family Medicine

## 2022-08-11 DIAGNOSIS — M359 Systemic involvement of connective tissue, unspecified: Secondary | ICD-10-CM

## 2022-08-11 NOTE — Telephone Encounter (Signed)
Requested medication (s) are due for refill today: Yes  Requested medication (s) are on the active medication list: Yes  Last refill:  02/08/22  Future visit scheduled: No  Notes to clinic:  Manual review.    Requested Prescriptions  Pending Prescriptions Disp Refills   diclofenac (VOLTAREN) 75 MG EC tablet [Pharmacy Med Name: DICLOFENAC SOD EC 75 MG TAB] 180 tablet 1    Sig: TAKE 1 TABLET BY MOUTH TWICE A DAY AS NEEDED     Analgesics:  NSAIDS Failed - 08/11/2022  2:51 AM      Failed - Manual Review: Labs are only required if the patient has taken medication for more than 8 weeks.      Failed - HCT in normal range and within 360 days    Hematocrit  Date Value Ref Range Status  05/10/2022 31.9 (L) 34.0 - 46.6 % Final         Passed - Cr in normal range and within 360 days    Creatinine  Date Value Ref Range Status  05/13/2011 0.77 0.60 - 1.30 mg/dL Final   Creatinine, Ser  Date Value Ref Range Status  10/16/2021 0.74 0.44 - 1.00 mg/dL Final         Passed - HGB in normal range and within 360 days    Hemoglobin  Date Value Ref Range Status  05/10/2022 11.4 11.1 - 15.9 g/dL Final         Passed - PLT in normal range and within 360 days    Platelets  Date Value Ref Range Status  05/10/2022 347 150 - 450 x10E3/uL Final         Passed - eGFR is 30 or above and within 360 days    EGFR (African American)  Date Value Ref Range Status  05/13/2011 >60  Final   GFR calc Af Amer  Date Value Ref Range Status  04/09/2019 108 >59 mL/min/1.73 Final   EGFR (Non-African Amer.)  Date Value Ref Range Status  05/13/2011 >60  Final    Comment:    eGFR values <28mL/min/1.73 m2 may be an indication of chronic kidney disease (CKD). Calculated eGFR is useful in patients with stable renal function. The eGFR calculation will not be reliable in acutely ill patients when serum creatinine is changing rapidly. It is not useful in  patients on dialysis. The eGFR calculation may not be  applicable to patients at the low and high extremes of body sizes, pregnant women, and vegetarians.    GFR, Estimated  Date Value Ref Range Status  10/16/2021 >60 >60 mL/min Final    Comment:    (NOTE) Calculated using the CKD-EPI Creatinine Equation (2021)    eGFR  Date Value Ref Range Status  04/03/2020 92 >59 mL/min/1.73 Final         Passed - Patient is not pregnant      Passed - Valid encounter within last 12 months    Recent Outpatient Visits           3 months ago Acute right-sided low back pain with right-sided sciatica   [facility] [name], MD   8 months ago Encounter for smoking cessation counseling   [facility] [name], MD   1 year ago Mild intermittent asthma with acute exacerbation   [facility] Merita Norton T, FNP   2 years ago Lower abdominal pain   [facility] Mila Merry  E, MD   2 years ago Other fatigue   [facility] [name], MD

## 2022-08-14 ENCOUNTER — Other Ambulatory Visit: Payer: Self-pay | Admitting: Family Medicine

## 2022-08-14 DIAGNOSIS — G2581 Restless legs syndrome: Secondary | ICD-10-CM

## 2022-08-15 NOTE — Telephone Encounter (Signed)
Requested Prescriptions  Pending Prescriptions Disp Refills   gabapentin (NEURONTIN) 300 MG capsule [Pharmacy Med Name: GABAPENTIN 300 MG CAPSULE] 180 capsule 0    Sig: TAKE 1-2 CAPSULES (300-600 MG TOTAL) BY MOUTH AT BEDTIME.     Neurology: Anticonvulsants - gabapentin Passed - 08/14/2022 12:26 PM      Passed - Cr in normal range and within 360 days    Creatinine  Date Value Ref Range Status  05/13/2011 0.77 0.60 - 1.30 mg/dL Final   Creatinine, Ser  Date Value Ref Range Status  10/16/2021 0.74 0.44 - 1.00 mg/dL Final         Passed - Completed PHQ-2 or PHQ-9 in the last 360 days      Passed - Valid encounter within last 12 months    Recent Outpatient Visits           3 months ago Acute right-sided low back pain with right-sided sciatica   [facility] [name], MD   8 months ago Encounter for smoking cessation counseling   [facility] [name], MD   1 year ago Mild intermittent asthma with acute exacerbation   [facility] Merita Norton T, FNP   2 years ago Lower abdominal pain   [facility] [name], MD   2 years ago Other fatigue   [facility] [name], MD

## 2022-08-17 ENCOUNTER — Ambulatory Visit: Payer: BC Managed Care – PPO | Admitting: Psychology

## 2022-08-17 DIAGNOSIS — Z634 Disappearance and death of family member: Secondary | ICD-10-CM | POA: Diagnosis not present

## 2022-08-17 DIAGNOSIS — F4323 Adjustment disorder with mixed anxiety and depressed mood: Secondary | ICD-10-CM

## 2022-08-17 DIAGNOSIS — F4310 Post-traumatic stress disorder, unspecified: Secondary | ICD-10-CM | POA: Diagnosis not present

## 2022-08-17 DIAGNOSIS — F4321 Adjustment disorder with depressed mood: Secondary | ICD-10-CM

## 2022-08-18 NOTE — Progress Notes (Signed)
[facility] Counselor/Therapist Progress Note  Patient ID: Heidi [name], MRN: [id number],    Date:  08/17/2022  Time Spent:  60 minutes  Time In:  5:03 Time out: 6:05  Treatment Type: Individual Therapy  Reported Symptoms: sadness, irritability,   Mental Status Exam: Appearance:  Casual     Behavior: Appropriate  Motor: Normal  Speech/Language:  Normal Rate  Affect:  blunted  Mood:  pleasant  Thought process: normal  Thought content:   WNL  Sensory/Perceptual disturbances:   WNL  Orientation: oriented to person, place, time/date, and situation  Attention: Good  Concentration: Good  Memory: WNL  Fund of knowledge:  Good  Insight:   Good  Judgment:  Good  Impulse Control: Good   Risk Assessment: Danger to Self:  No Self-injurious Behavior: No Danger to Others: No Duty to Warn:no Physical Aggression / Violence:No  Access to Firearms a concern: No  Gang Involvement:No   Subjective: The patient attended a face to face individual therapy session in the office today.  The patient presents with a blunted affect and her mood is pleasant.  The patient reports that she got an offer from that interview that she had.  She has also had some other interviews but feels like the offer she has may be the best option.  She says that she is very excited about being able to make this decision.  She is waiting for something in writing so that she can make that resignation from her job.  The patient talked today about feeling better about politics as well and she seems to be doing well with her health issues.  We talked about the next Friday being the anniversary date of her son's suicide and discussed what she is planning to do.  We talked about her doing what she needs to do to take care of herself and she may end up getting together with her son and their father.  Encouraged her to be true to what it is that she feels like she needs to do time on her her  son.  Interventions:  CBT, problem solving, insight oriented, interpersonal  Diagnosis:PTSD (post-traumatic stress disorder)  Grief at loss of child  Adjustment disorder with mixed anxiety and depressed mood  Plan: Please see plan inClient Abilities/Strengths  Intelligent, ability for insight, motivated  Client Treatment Preferences  Outpatient Individual therapy  Client Statement of Needs  "I need some therapy to deal with my trauma and my grief"  Treatment Level  Outpatient Individual therapy  Symptoms  Experiences disturbances in sleep.: (Status: improved). Experiences  disturbing and persistent thoughts, images, and/or perceptions of the traumatic event.: (Status: improved). Has been exposed to a traumatic event involving actual or perceived  threat of death or serious injury.: (Status: maintained). Impairment in social,  occupational, or other areas of functioning.:  (Status: improved). Reports  difficulty concentrating as well as feelings of guilt.: (Status: improved).  Thoughts dominated by loss coupled with poor concentration, tearful spells, and confusion about the  future.: (Status: Improved).  Problems Addressed  Grief / Loss Unresolved, Posttraumatic Stress Disorder (PTSD)  Goals 1. Begin a healthy grieving process around the loss. Objective Begin verbalizing feelings associated with the loss. Target Date: 2022-09-24 Frequency:  BiWeekly Progress: 60 Modality: individual Related Interventions 1. Assist the client in identifying and expressing feelings connected with his/her loss. Objective Tell in detail the story of the current loss that is triggering symptoms. Target Date: 2022-09-24 Frequency: Bi Weekly Progress: 60 Modality: individual  Related Interventions 1. Create a safe environment for disclosure and actively build the level of trust with the client in  individual sessions through consistent eye contact, active listening, unconditional positive  regard,  and warm acceptance to help increase his/her ability to identify and express thoughts  and feelings. 2. Use empathy, compassion, and support, allowing the client to tell in detail the story of his/her  recent loss. 2. Returns to the level of psychological functioning prior to exposure to  the traumatic event. Objective Participate in Eye Movement Desensitization and Reprocessing (EMDR) to reduce emotional distress  related to traumatic thoughts, feelings, and images. Target Date: 2022-09-24 Frequency: BiWeekly Progress: 0 Modality: individual Related Interventions 1. Utilize Eye Movement Desensitization and Reprocessing (EMDR) to reduce the client's  emotional reactivity to the traumatic event and reduce PTSD symptoms. Objective Learn and implement guided self-dialogue to manage thoughts, feelings, and urges brought on by  encounters with trauma-related situations. Target Date: 2022-09-24 Frequency: BiWeekly Progress: 60 Modality: individual  Related Interventions 1. Teach the client a guided self-dialogue procedure in which he/she learns to recognize  maladaptive self-talk, challenges its biases, copes with engendered feelings, overcomes  avoidance, and reinforces his/her accomplishments; review and reinforce progress, problemsolve obstacles. Diagnosis Axis  F43.10 (Posttraumatic stress disorder) - Posttraumatic Stress  Disorder  Conditions For Discharge Achievement of treatment goals and objectives  The patient approved this plan and is currently maintaining progress.    Heidi Bolduc G Agapito Hanway, LCSW

## 2022-08-31 ENCOUNTER — Ambulatory Visit: Payer: BC Managed Care – PPO | Admitting: Psychology

## 2022-09-07 ENCOUNTER — Encounter: Payer: Self-pay | Admitting: Family Medicine

## 2022-09-14 ENCOUNTER — Ambulatory Visit (INDEPENDENT_AMBULATORY_CARE_PROVIDER_SITE_OTHER): Payer: BC Managed Care – PPO | Admitting: Psychology

## 2022-09-14 DIAGNOSIS — F4310 Post-traumatic stress disorder, unspecified: Secondary | ICD-10-CM

## 2022-09-14 DIAGNOSIS — F4323 Adjustment disorder with mixed anxiety and depressed mood: Secondary | ICD-10-CM | POA: Diagnosis not present

## 2022-09-14 DIAGNOSIS — Z634 Disappearance and death of family member: Secondary | ICD-10-CM | POA: Diagnosis not present

## 2022-09-14 DIAGNOSIS — F4321 Adjustment disorder with depressed mood: Secondary | ICD-10-CM

## 2022-09-14 NOTE — Progress Notes (Signed)
[facility] Counselor/Therapist Progress Note  Patient ID: Heidi [name], MRN: [id number],    Date:  09/14/2022  Time Spent:  60 minutes  Time In:  5:03 Time out: 6:05  Treatment Type: Individual Therapy  Reported Symptoms: sadness, irritability,   Mental Status Exam: Appearance:  Casual     Behavior: Appropriate  Motor: Normal  Speech/Language:  Normal Rate  Affect:  blunted  Mood:  pleasant  Thought process: normal  Thought content:   WNL  Sensory/Perceptual disturbances:   WNL  Orientation: oriented to person, place, time/date, and situation  Attention: Good  Concentration: Good  Memory: WNL  Fund of knowledge:  Good  Insight:   Good  Judgment:  Good  Impulse Control: Good   Risk Assessment: Danger to Self:  No Self-injurious Behavior: No Danger to Others: No Duty to Warn:no Physical Aggression / Violence:No  Access to Firearms a concern: No  Gang Involvement:No   Subjective: The patient attended a face to face individual therapy session via video visit today.  The patient gave verbal consent for the session to be on caregility and is aware of the limitations of telehealth.  The patient was in her backyard alone and the therapist was in the office.  The patient presents with a blunted affect and her mood is pleasant.  The patient states that she is excited about her new change with the new job and also going to school.  She starts her new job a week from Monday.  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  The patient did talk today about wanting to discontinue therapy right now because of the financial and situation and insurance.  The patient seems to be doing well for the most part anyway.  The patient talked today about her son Aurelio Brash and she also talked about a situation with one of her colleagues whose son overdosed.  We talked about how she helped him and his wife by attending the wake  for their son.  The patient seems to be handling things okay with the loss of her son and does not seem to be struggling as much with the grief even though she is definitely sad about the loss.  We will discontinue recurring appointments as of this week and she is aware that she can come back if she needs to. Interventions:  CBT, problem solving, insight oriented, interpersonal  Diagnosis:PTSD (post-traumatic stress disorder)  Grief at loss of child  Adjustment disorder with mixed anxiety and depressed mood  Plan: Please see plan inClient Abilities/Strengths  Intelligent, ability for insight, motivated  Client Treatment Preferences  Outpatient Individual therapy  Client Statement of Needs  "I need some therapy to deal with my trauma and my grief"  Treatment Level  Outpatient Individual therapy  Symptoms  Experiences disturbances in sleep.: (Status: improved). Experiences  disturbing and persistent thoughts, images, and/or perceptions of the traumatic event.: (Status: improved). Has been exposed to a traumatic event involving actual or perceived  threat of death or serious injury.: (Status: maintained). Impairment in social,  occupational, or other areas of functioning.:  (Status: improved). Reports  difficulty concentrating as well as feelings of guilt.: (Status: improved).  Thoughts dominated by loss coupled with poor concentration, tearful spells, and confusion about the  future.: (Status: Improved).  Problems Addressed  Grief / Loss Unresolved, Posttraumatic Stress Disorder (PTSD)  Goals 1. Begin a healthy grieving process around the loss. Objective Begin  verbalizing feelings associated with the loss. Target Date: 2022-09-24 Frequency:  BiWeekly Progress: 70 Modality: individual Related Interventions 1. Assist the client in identifying and expressing feelings connected with his/her loss. Objective Tell in detail the story of the current loss that is triggering symptoms. Target  Date: 2022-09-24 Frequency: Bi Weekly Progress: 70 Modality: individual Related Interventions 1. Create a safe environment for disclosure and actively build the level of trust with the client in  individual sessions through consistent eye contact, active listening, unconditional positive  regard, and warm acceptance to help increase his/her ability to identify and express thoughts  and feelings. 2. Use empathy, compassion, and support, allowing the client to tell in detail the story of his/her  recent loss. 2. Returns to the level of psychological functioning prior to exposure to  the traumatic event. Objective Participate in Eye Movement Desensitization and Reprocessing (EMDR) to reduce emotional distress  related to traumatic thoughts, feelings, and images. Target Date: 2022-09-24 Frequency: BiWeekly Progress: 0 Modality: individual Related Interventions 1. Utilize Eye Movement Desensitization and Reprocessing (EMDR) to reduce the client's  emotional reactivity to the traumatic event and reduce PTSD symptoms. Objective Learn and implement guided self-dialogue to manage thoughts, feelings, and urges brought on by  encounters with trauma-related situations. Target Date: 2022-09-24 Frequency: BiWeekly Progress: 70 Modality: individual  Related Interventions 1. Teach the client a guided self-dialogue procedure in which he/she learns to recognize  maladaptive self-talk, challenges its biases, copes with engendered feelings, overcomes  avoidance, and reinforces his/her accomplishments; review and reinforce progress, problemsolve obstacles. Diagnosis Axis  F43.10 (Posttraumatic stress disorder) - Posttraumatic Stress  Disorder  Conditions For Discharge Achievement of treatment goals and objectives  The patient approved this plan and is currently maintaining progress.    Langley Ingalls G Davien Malone,  LCSW

## 2022-09-28 ENCOUNTER — Ambulatory Visit: Payer: BC Managed Care – PPO | Admitting: Psychology

## 2022-10-12 ENCOUNTER — Ambulatory Visit: Payer: BC Managed Care – PPO | Admitting: Psychology

## 2022-10-26 ENCOUNTER — Ambulatory Visit: Payer: BC Managed Care – PPO | Admitting: Psychology

## 2022-11-09 ENCOUNTER — Ambulatory Visit: Payer: BC Managed Care – PPO | Admitting: Psychology

## 2022-11-13 ENCOUNTER — Other Ambulatory Visit: Payer: Self-pay | Admitting: Family Medicine

## 2022-11-13 DIAGNOSIS — G2581 Restless legs syndrome: Secondary | ICD-10-CM

## 2023-01-09 ENCOUNTER — Encounter: Payer: Self-pay | Admitting: Family Medicine

## 2023-01-16 ENCOUNTER — Ambulatory Visit: Payer: 59 | Admitting: Family Medicine

## 2023-01-16 VITALS — BP 137/94 | HR 64 | Resp 18 | Ht 68.0 in | Wt 178.0 lb

## 2023-01-16 DIAGNOSIS — M5412 Radiculopathy, cervical region: Secondary | ICD-10-CM

## 2023-01-16 DIAGNOSIS — M2550 Pain in unspecified joint: Secondary | ICD-10-CM

## 2023-01-16 DIAGNOSIS — F4310 Post-traumatic stress disorder, unspecified: Secondary | ICD-10-CM

## 2023-01-16 DIAGNOSIS — F439 Reaction to severe stress, unspecified: Secondary | ICD-10-CM | POA: Diagnosis not present

## 2023-01-16 DIAGNOSIS — M359 Systemic involvement of connective tissue, unspecified: Secondary | ICD-10-CM | POA: Diagnosis not present

## 2023-01-16 MED ORDER — PREDNISONE 10 MG PO TABS: ORAL_TABLET | ORAL | 0 refills | Status: AC

## 2023-01-16 MED ORDER — PREGABALIN 75 MG PO CAPS: 75.0000 mg | ORAL_CAPSULE | Freq: Two times a day (BID) | ORAL | 1 refills | Status: DC

## 2023-01-22 NOTE — Telephone Encounter (Signed)
 Seen in office 01-16-2023

## 2023-01-23 NOTE — Progress Notes (Signed)
 Established patient visit   Patient: Heidi [name]   DOB: Aug 23, 1975   48 y.o. Female  MRN: [id number] Visit Date: 01/16/2023  Today's healthcare provider: Nancyann Perry, MD   Chief Complaint  Patient presents with   Back Pain    Lower back, improves with movement, laying down is difficult and she has a hard time moving   Hip Pain    Right side   Subjective    Discussed the use of AI scribe software for clinical note transcription with the patient, who gave verbal consent to proceed.  History of Present Illness   The patient, a 48 year old with a history of chronic pain presents with worsening pain in the right hip and lower back. The pain, described as constant and debilitating, is particularly severe at night, causing significant sleep disturbances. The patient reports stiffness and sharp, shooting pains when attempting to change positions in bed, often requiring assistance to move. The pain has been ongoing for several months and is exacerbated by prolonged periods of standing or sitting.  The patient also reports new-onset knee pain, specifically when squatting, which she attributes to increased bending at her new job. The patient has a history of neck pain due to diagnosed bulging discs and bone spurring, and she expresses concern about potential similar issues in the lower back.  In addition to the musculoskeletal complaints, the patient describes a burning sensation in her legs, primarily noticed upon waking or after changing positions during sleep. This sensation is severe enough to necessitate getting out of bed for relief. The patient also reports intermittent dull aching in the entire right leg, although this is not consistent.  The patient has also been experiencing a slight tingling, burning, and throbbing sensation in her hands during sleep, regardless of hand position. The sensation is not described as painful, but rather as a feeling of slight swelling. The patient  has a history of a diagnosed pinched nerve in the neck, which was previously associated with similar symptoms.  The patient has been managing her symptoms primarily with heat and ice, and occasionally over-the-counter pain relievers for headaches. She has been taking diclofenac  which she believes may be providing some relief, although she is unsure. She also reports taking gabapentin  for restless legs, which she believes is effective for that specific symptom but does not alleviate the burning sensation in her legs.  The patient has a history of anxiety, depression, and PTSD and is currently on Zoloft and lamotrigine. She expresses a strong desire to avoid any medications that could potentially exacerbate her mental health symptoms. She also reports a recent tenderness in the area of her lymph nodes, although this is not a consistent symptom.  In summary, the patient presents with worsening chronic pain in the right hip and lower back, new-onset knee pain, and ongoing burning sensations in the legs and hands. These symptoms are significantly impacting the patient's quality of life, particularly her sleep, and she is seeking both relief and a plan to prevent further damage.       Medications: Outpatient Medications Prior to Visit  Medication Sig   cyanocobalamin (VITAMIN B12) 1000 MCG tablet Take 1,000 mcg by mouth daily.   diclofenac  (VOLTAREN ) 75 MG EC tablet TAKE 1 TABLET BY MOUTH TWICE A DAY AS NEEDED   gabapentin  (NEURONTIN ) 300 MG capsule TAKE 1-2 CAPSULES (300-600 MG TOTAL) BY MOUTH AT BEDTIME.   lamoTRIgine (LAMICTAL) 200 MG tablet Take by mouth every morning.   sertraline (ZOLOFT)  50 MG tablet Take 50 mg by mouth daily.   valACYclovir  (VALTREX ) 1000 MG tablet TAKE 1 TABLET BY MOUTH EVERY DAY   No facility-administered medications prior to visit.   Review of Systems     Objective    BP (!) 137/94   Pulse 64   Resp 18   Ht 5' 8 (1.727 m)   Wt 178 lb (80.7 kg)   SpO2 100%   BMI  27.06 kg/m   Physical Exam   General: Appearance:    Well developed, well nourished female in no acute distress  Eyes:    PERRL, conjunctiva/corneas clear, EOM's intact       Lungs:     Clear to auscultation bilaterally, respirations unlabored  Heart:    Normal heart rate. Normal rhythm. No murmurs, rubs, or gallops.    MS:   All extremities are intact.    Neurologic:   Awake, alert, oriented x 3. No apparent focal neurological defect.           Assessment & Plan        Chronic Pain (Lower Back, Hips, Knees) Persistent pain, worse at night, causing sleep disturbance. Pain is relieved by movement. No clear pattern of pain. Previous history of neck pain due to bulging discs and bone spurring. -Start Prednisone  to reduce inflammation, monitor for mood changes. -Start Lyrica  twice a day for nerve pain, continue Gabapentin  at night for restless legs. -Continue Diclofenac . -Consider MRI if pain returns after finishing Prednisone  course.  Restless Legs Syndrome Burning sensation in legs, more noticeable upon waking or after movement during sleep. Gabapentin  at night has been effective. -Continue Gabapentin  at night.  Carpal Tunnel Syndrome Intermittent tingling, burning, and throbbing in hands, not related to wrist position. No significant swelling. -Continue current management, monitor symptoms.  General Health Maintenance -Continue Doculase as needed.         Nancyann Perry, MD  [facility] [phone] (phone) [phone] (fax)  [facility]

## 2023-02-13 ENCOUNTER — Encounter: Payer: Self-pay | Admitting: Family Medicine

## 2023-02-15 ENCOUNTER — Other Ambulatory Visit: Payer: Self-pay | Admitting: Family Medicine

## 2023-02-15 DIAGNOSIS — M359 Systemic involvement of connective tissue, unspecified: Secondary | ICD-10-CM

## 2023-02-17 ENCOUNTER — Other Ambulatory Visit: Payer: Self-pay | Admitting: Family Medicine

## 2023-02-17 DIAGNOSIS — G2581 Restless legs syndrome: Secondary | ICD-10-CM

## 2023-02-21 ENCOUNTER — Encounter: Payer: Self-pay | Admitting: Family Medicine

## 2023-02-21 ENCOUNTER — Other Ambulatory Visit: Payer: Self-pay | Admitting: Family Medicine

## 2023-02-21 ENCOUNTER — Other Ambulatory Visit: Payer: Self-pay

## 2023-02-21 DIAGNOSIS — M359 Systemic involvement of connective tissue, unspecified: Secondary | ICD-10-CM

## 2023-02-21 MED ORDER — GABAPENTIN 300 MG PO CAPS: 300.0000 mg | ORAL_CAPSULE | Freq: Every day | ORAL | 3 refills | Status: DC

## 2023-03-01 ENCOUNTER — Encounter: Payer: Self-pay | Admitting: Family Medicine

## 2023-03-06 ENCOUNTER — Telehealth: Payer: Self-pay | Admitting: Gastroenterology

## 2023-03-06 NOTE — Telephone Encounter (Signed)
 She is not due for her screening colonoscopy until 03/2028  RV

## 2023-03-06 NOTE — Telephone Encounter (Signed)
 Pt requesting call in ref to time for next colonoscopy unsure that it has been 5 years

## 2023-03-06 NOTE — Telephone Encounter (Signed)
 Please advise if patient is due for colonoscopy. On 08/06/2020 you informed patient by mychart that her colonoscopy was normal you reviewed it form [facility]. Please let me know when she will be do for a colonoscopy

## 2023-03-06 NOTE — Telephone Encounter (Signed)
 Called and left a message for call back

## 2023-03-07 ENCOUNTER — Other Ambulatory Visit: Payer: Self-pay

## 2023-03-07 DIAGNOSIS — Z800 Family history of malignant neoplasm of digestive organs: Secondary | ICD-10-CM

## 2023-03-07 DIAGNOSIS — K529 Noninfective gastroenteritis and colitis, unspecified: Secondary | ICD-10-CM

## 2023-03-07 MED ORDER — NA SULFATE-K SULFATE-MG SULF 17.5-3.13-1.6 GM/177ML PO SOLN: 354.0000 mL | Freq: Once | ORAL | 0 refills | Status: AC

## 2023-03-07 NOTE — Telephone Encounter (Signed)
 Called and left a message for call back

## 2023-03-07 NOTE — Telephone Encounter (Signed)
 Patient states she had been on a 5 year plan since her mother had colon cancer and had it age 48. Patient states she thought she would be due for another one.

## 2023-03-07 NOTE — Telephone Encounter (Signed)
 Sent mychart message because patient said this morning she could not answer at work and to just sent a [employer]

## 2023-03-13 ENCOUNTER — Other Ambulatory Visit: Payer: Self-pay | Admitting: Orthopedic Surgery

## 2023-03-13 DIAGNOSIS — M4807 Spinal stenosis, lumbosacral region: Secondary | ICD-10-CM

## 2023-03-13 DIAGNOSIS — M25551 Pain in right hip: Secondary | ICD-10-CM

## 2023-03-15 ENCOUNTER — Encounter: Payer: Self-pay | Admitting: Gastroenterology

## 2023-03-17 ENCOUNTER — Ambulatory Visit
Admission: RE | Admit: 2023-03-17 | Discharge: 2023-03-17 | Disposition: A | Discharge status: Home / Self Care | Source: Ambulatory Visit | Attending: Orthopedic Surgery | Admitting: Orthopedic Surgery

## 2023-03-17 DIAGNOSIS — M4807 Spinal stenosis, lumbosacral region: Secondary | ICD-10-CM

## 2023-03-17 DIAGNOSIS — M25551 Pain in right hip: Secondary | ICD-10-CM

## 2023-03-21 IMAGING — CT CT ABD-PELV W/O CM
2 of 4 series · 16 of 46 positions shown, 18 images · non-contrast
Comparison: CT chest abdomen pelvis dated 05/19/2007.

CLINICAL DATA: 44-year-old female with abdominal distension.

EXAM:
CT ABDOMEN AND PELVIS WITHOUT CONTRAST
TECHNIQUE: Multidetector CT imaging of the abdomen and pelvis was performed
following the standard protocol without IV contrast.

[Series 2: routine abd/pel wo · axial · 0.86mm/px · z∈[-512,-77]mm · 13 of 95 slices shown, 15 images]
[im 4/95  soft-tissue]
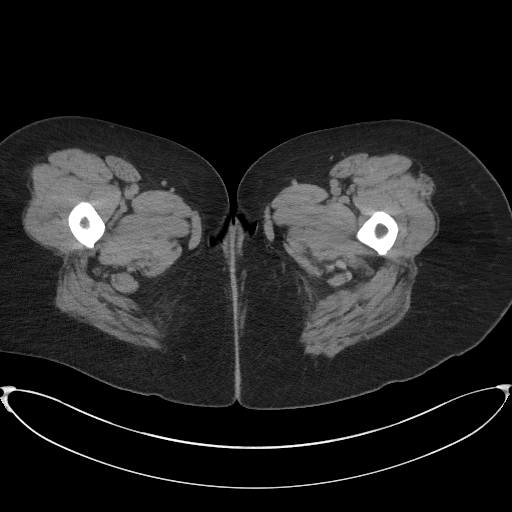
[im 4/95  bone]
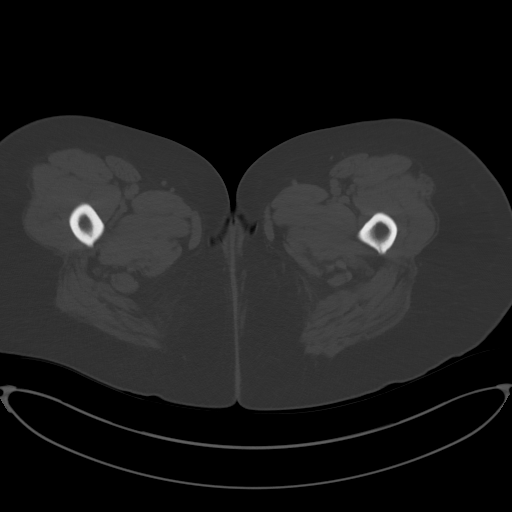
[im 12/95  soft-tissue]
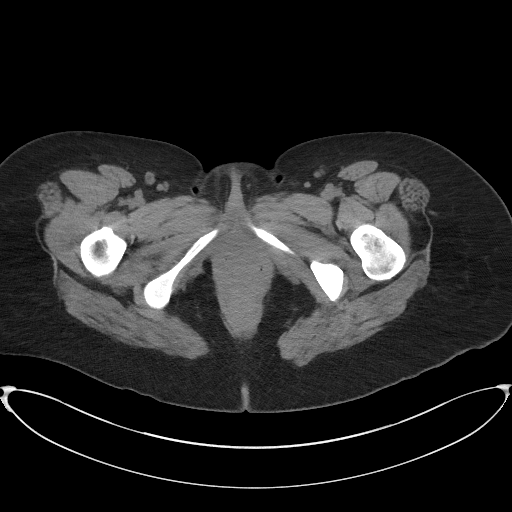
[im 19/95  soft-tissue]
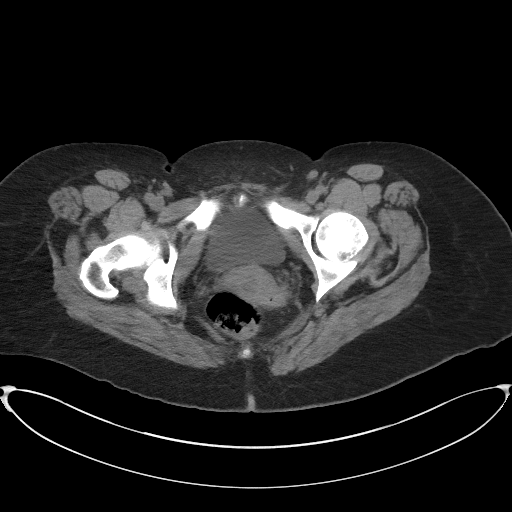
[im 27/95  soft-tissue]
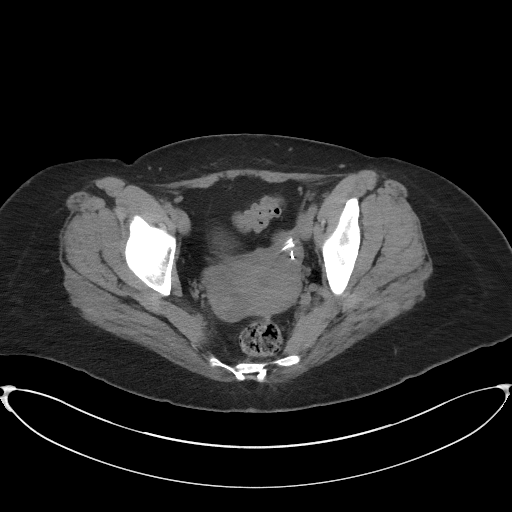
[im 34/95  soft-tissue]
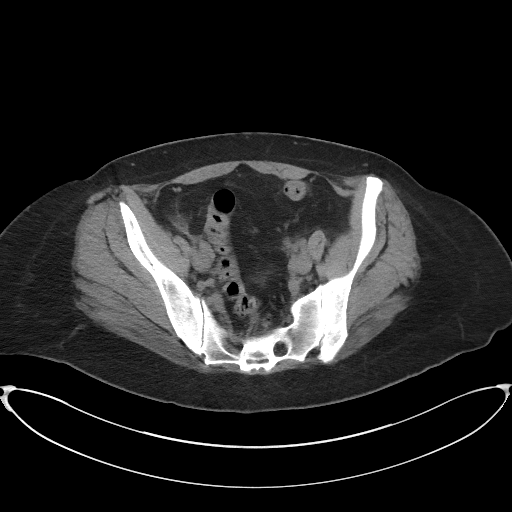
[im 42/95  soft-tissue]
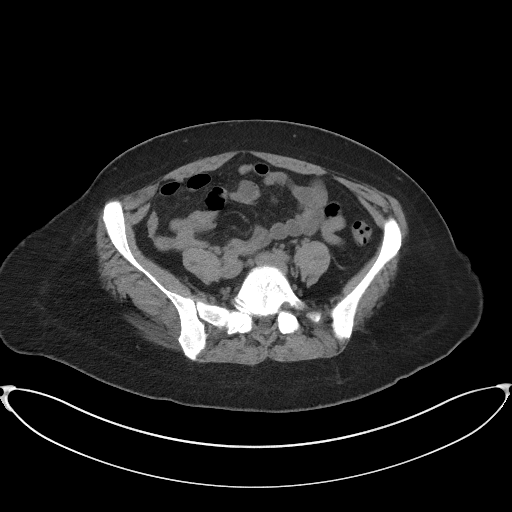
[im 49/95  soft-tissue]
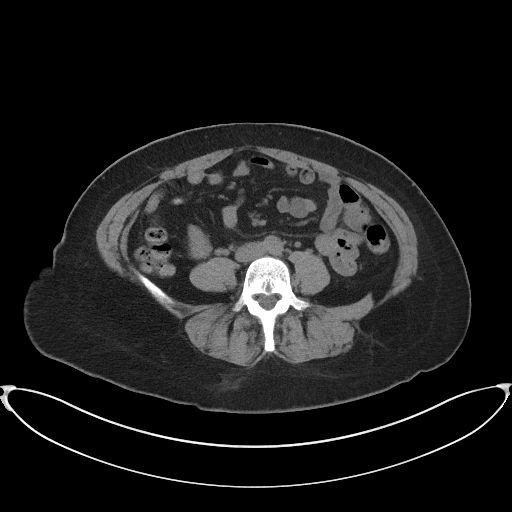
[im 53/95  soft-tissue]
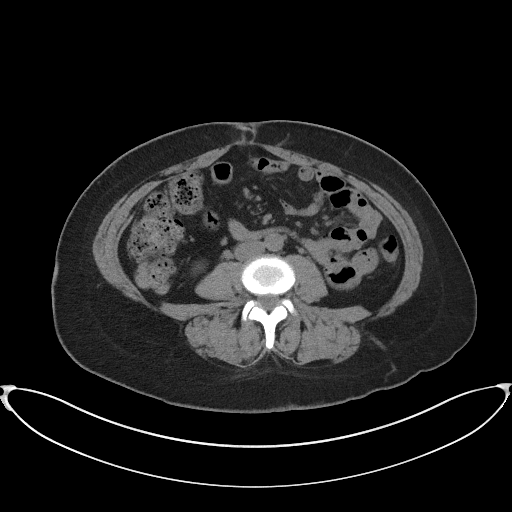
[im 61/95  soft-tissue]
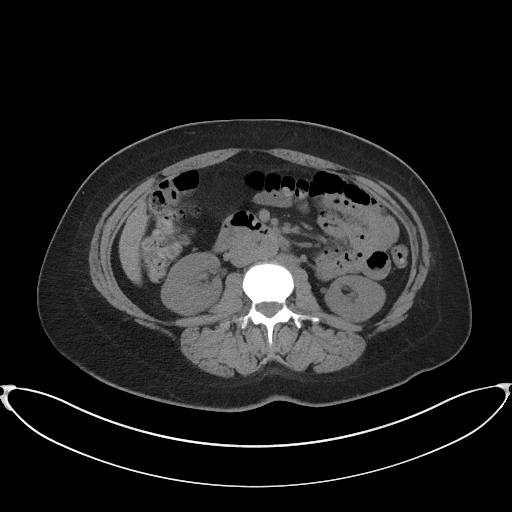
[im 61/95  bone]
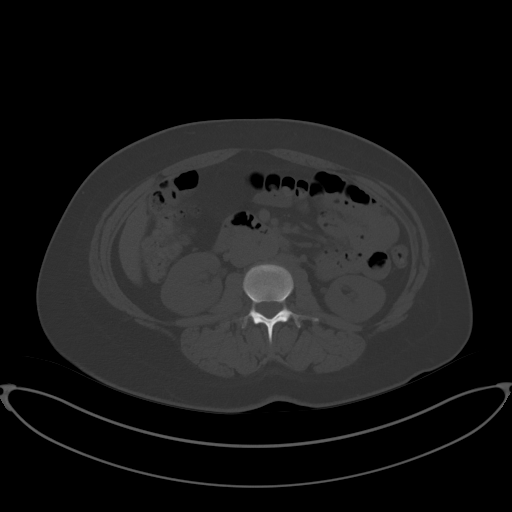
[im 68/95  soft-tissue]
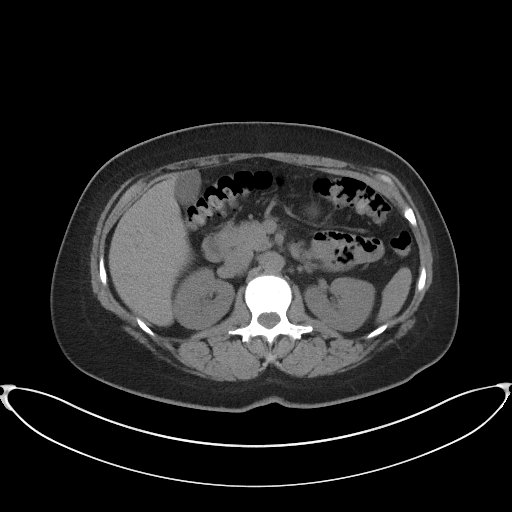
[im 76/95  soft-tissue]
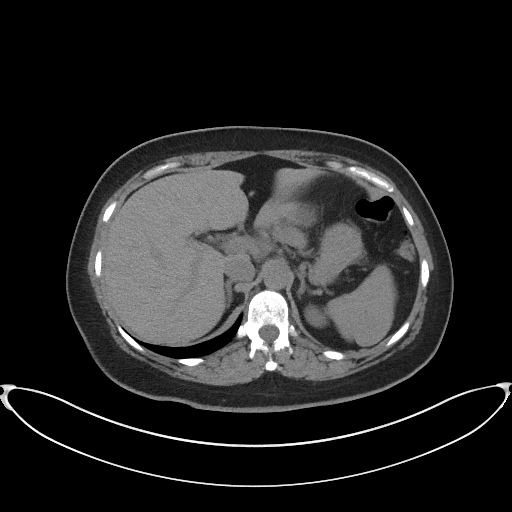
[im 83/95  soft-tissue]
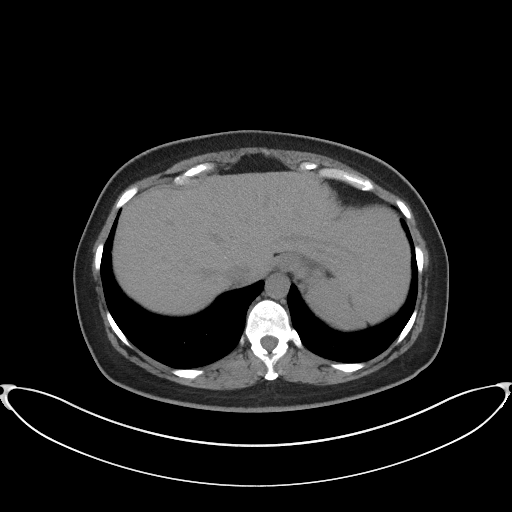
[im 91/95  soft-tissue]
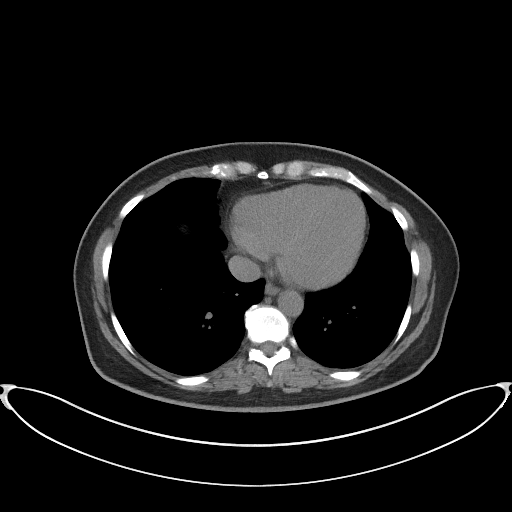

[Series 5: coronal st · coronal · 0.84mm/px · 3 of 87 slices shown]
[im 29/87  soft-tissue]
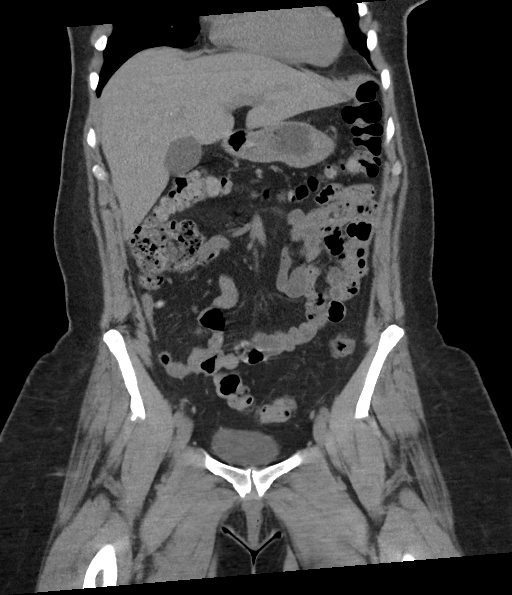
[im 39/87  soft-tissue]
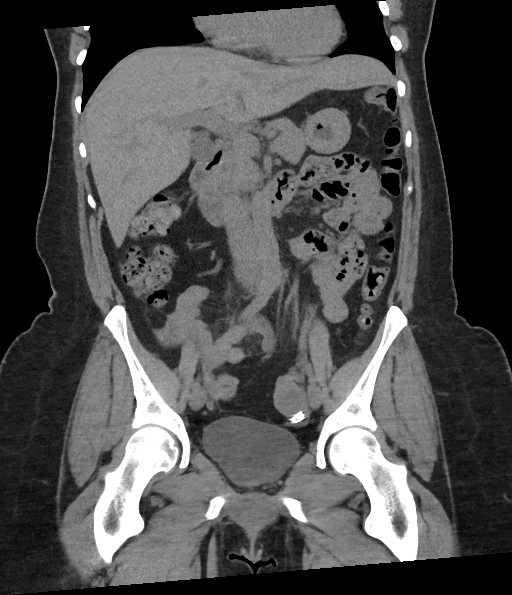
[im 48/87  soft-tissue]
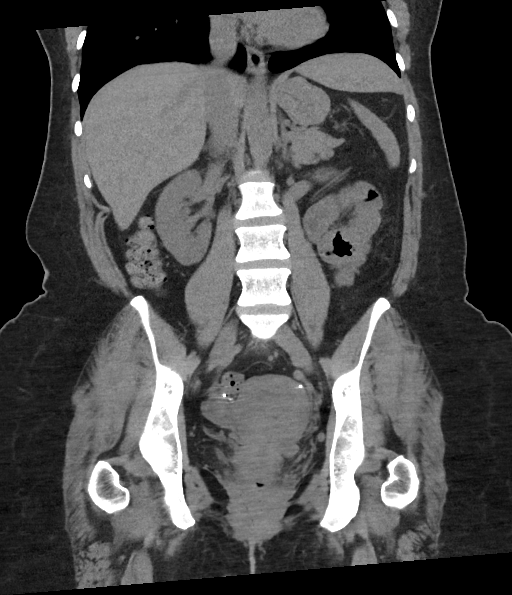

[16 of 46 positions shown; findings below may reference images not displayed]

FINDINGS: Evaluation of this exam is limited in the absence of intravenous
contrast.

Lower chest: The visualized lung bases are clear.

No intra-abdominal free air or free fluid.

Hepatobiliary: No focal liver abnormality is seen. No gallstones,
gallbladder wall thickening, or biliary dilatation.

Pancreas: Unremarkable. No pancreatic ductal dilatation or
surrounding inflammatory changes.

Spleen: Normal in size without focal abnormality.

Adrenals/Urinary Tract: The adrenal glands unremarkable. Kidneys,
visualized ureters, and urinary bladder appear unremarkable.

Stomach/Bowel: Small scattered sigmoid diverticula without active
inflammatory changes there is no bowel obstruction or active
inflammation. The appendix is normal.

Vascular/Lymphatic: The abdominal aorta and IVC unremarkable. No
portal venous gas. There is no adenopathy.

Reproductive: The uterus is grossly unremarkable. Bilateral adnexal
tubal ligation clips and occlusive device is noted. The ovaries are
grossly unremarkable

Other: Small fat containing umbilical hernia. No fluid collection or
inflammatory changes.

Musculoskeletal: Degenerative changes at L5-S1. No acute osseous
pathology.
IMPRESSION: 1. No acute intra-abdominal or pelvic pathology. No bowel
obstruction. Normal appendix.
2. Small scattered sigmoid diverticula.

## 2023-03-24 ENCOUNTER — Ambulatory Visit
Admission: RE | Admit: 2023-03-24 | Discharge: 2023-03-24 | Disposition: A | Discharge status: Home / Self Care | Payer: Self-pay | Attending: Gastroenterology | Admitting: Gastroenterology

## 2023-03-24 ENCOUNTER — Encounter: Payer: Self-pay | Admitting: Gastroenterology

## 2023-03-24 ENCOUNTER — Other Ambulatory Visit: Payer: Self-pay

## 2023-03-24 ENCOUNTER — Ambulatory Visit: Payer: Self-pay | Admitting: Anesthesiology

## 2023-03-24 ENCOUNTER — Encounter
Admission: RE | Disposition: A | Discharge status: Home / Self Care | Payer: Self-pay | Source: Home / Self Care | Attending: Gastroenterology

## 2023-03-24 DIAGNOSIS — F17210 Nicotine dependence, cigarettes, uncomplicated: Secondary | ICD-10-CM | POA: Insufficient documentation

## 2023-03-24 DIAGNOSIS — D122 Benign neoplasm of ascending colon: Secondary | ICD-10-CM | POA: Diagnosis not present

## 2023-03-24 DIAGNOSIS — F419 Anxiety disorder, unspecified: Secondary | ICD-10-CM | POA: Diagnosis not present

## 2023-03-24 DIAGNOSIS — F32A Depression, unspecified: Secondary | ICD-10-CM | POA: Insufficient documentation

## 2023-03-24 DIAGNOSIS — Z800 Family history of malignant neoplasm of digestive organs: Secondary | ICD-10-CM | POA: Diagnosis not present

## 2023-03-24 DIAGNOSIS — Z1211 Encounter for screening for malignant neoplasm of colon: Secondary | ICD-10-CM | POA: Diagnosis not present

## 2023-03-24 DIAGNOSIS — K635 Polyp of colon: Secondary | ICD-10-CM

## 2023-03-24 HISTORY — DX: Restless legs syndrome: G25.81

## 2023-03-24 HISTORY — DX: Other specified health status: Z78.9

## 2023-03-24 HISTORY — PX: POLYPECTOMY: SHX5525

## 2023-03-24 HISTORY — PX: COLONOSCOPY WITH PROPOFOL: SHX5780

## 2023-03-24 HISTORY — DX: Nonrheumatic mitral (valve) prolapse: I34.1

## 2023-03-24 HISTORY — DX: Presence of spectacles and contact lenses: Z97.3

## 2023-03-24 HISTORY — DX: Torticollis: M43.6

## 2023-03-24 LAB — POCT PREGNANCY, URINE: Preg Test, Ur: NEGATIVE

## 2023-03-24 SURGERY — COLONOSCOPY WITH PROPOFOL
Anesthesia: General | Site: Rectum

## 2023-03-24 MED ORDER — EPHEDRINE 5 MG/ML INJ
INTRAVENOUS | Status: AC
  Filled 2023-03-24: qty 5

## 2023-03-24 MED ORDER — LACTATED RINGERS IV SOLN: INTRAVENOUS | Status: DC

## 2023-03-24 MED ORDER — STERILE WATER FOR IRRIGATION IR SOLN
Status: DC | PRN
  Administered 2023-03-24: 60 mL

## 2023-03-24 MED ORDER — STERILE WATER FOR IRRIGATION IR SOLN
Status: DC | PRN
  Administered 2023-03-24: 1

## 2023-03-24 MED ORDER — EPHEDRINE SULFATE (PRESSORS) 50 MG/ML IJ SOLN
INTRAMUSCULAR | Status: DC | PRN
  Administered 2023-03-24: 7.5 mg via INTRAVENOUS

## 2023-03-24 MED ORDER — SODIUM CHLORIDE 0.9 % IV SOLN: INTRAVENOUS | Status: DC

## 2023-03-24 MED ORDER — PROPOFOL 10 MG/ML IV BOLUS
INTRAVENOUS | Status: DC | PRN
  Administered 2023-03-24 (×2): 40 mg via INTRAVENOUS
  Administered 2023-03-24: 100 mg via INTRAVENOUS
  Administered 2023-03-24: 40 mg via INTRAVENOUS
  Administered 2023-03-24 (×2): 30 mg via INTRAVENOUS

## 2023-03-24 SURGICAL SUPPLY — 15 items
CLIP HMST 235XBRD CATH ROT (MISCELLANEOUS) IMPLANT
ELECT REM PT RETURN 9FT ADLT (ELECTROSURGICAL) IMPLANT
ELECTRODE REM PT RTRN 9FT ADLT (ELECTROSURGICAL) IMPLANT
FORCEPS BIOP RAD 4 LRG CAP 4 (CUTTING FORCEPS) IMPLANT
GOWN CVR UNV OPN BCK APRN NK (MISCELLANEOUS) ×4 IMPLANT
INJECTOR VARIJECT VIN23 (MISCELLANEOUS) IMPLANT
KIT DEFENDO VALVE AND CONN (KITS) IMPLANT
KIT PRC NS LF DISP ENDO (KITS) ×2 IMPLANT
MANIFOLD NEPTUNE II (INSTRUMENTS) ×2 IMPLANT
MARKER SPOT ENDO TATTOO 5ML (MISCELLANEOUS) IMPLANT
PROBE APC STR FIRE (PROBE) IMPLANT
RETRIEVER NET ROTH 2.5X230 LF (MISCELLANEOUS) IMPLANT
SNARE COLD EXACTO (MISCELLANEOUS) IMPLANT
TRAP ETRAP POLY (MISCELLANEOUS) IMPLANT
VARIJECT INJECTOR VIN23 (MISCELLANEOUS) IMPLANT

## 2023-03-24 NOTE — Addendum Note (Signed)
 Addendum  created 03/24/23 0923 by Cylah Fannin, [place], CRNA   Intraprocedure Meds edited

## 2023-03-24 NOTE — Anesthesia Preprocedure Evaluation (Signed)
 Anesthesia Evaluation  Patient identified by MRN, date of birth, ID band Patient awake    Reviewed: Allergy & Precautions, H&P , NPO status , Patient's Chart, lab work & pertinent test results  Airway Mallampati: II  TM Distance: >3 FB Neck ROM: Full    Dental no notable dental hx.  Has "a cap somewhere" in right lower jaw:   Pulmonary neg pulmonary ROS, asthma , Current Smoker   Pulmonary exam normal breath sounds clear to auscultation       Cardiovascular negative cardio ROS Normal cardiovascular exam Rhythm:Regular Rate:Normal     Neuro/Psych  PSYCHIATRIC DISORDERS Anxiety Depression    negative neurological ROS  negative psych ROS   GI/Hepatic negative GI ROS, Neg liver ROS,,,  Endo/Other  negative endocrine ROS    Renal/GU negative Renal ROS  negative genitourinary   Musculoskeletal negative musculoskeletal ROS (+)    Abdominal   Peds negative pediatric ROS (+)  Hematology negative hematology ROS (+)   Anesthesia Other Findings Asthma  Depression Clotting disorder (HCC)  DVT (deep venous thrombosis) (HCC) Anxiety  Lupus  Family history of ovarian cancer Restless leg syndrome Wears contact lenses  Neck stiffness Body piercing  MVP (mitral valve prolapse)    Reproductive/Obstetrics negative OB ROS                              Anesthesia Physical Anesthesia Plan  ASA: 3  Anesthesia Plan: General   Post-op Pain Management:    Induction: Intravenous  PONV Risk Score and Plan:   Airway Management Planned: Natural Airway and Nasal Cannula  Additional Equipment:   Intra-op Plan:   Post-operative Plan:   Informed Consent: I have reviewed the patients History and Physical, chart, labs and discussed the procedure including the risks, benefits and alternatives for the proposed anesthesia with the patient or authorized representative who has indicated his/her  understanding and acceptance.     Dental Advisory Given  Plan Discussed with: Anesthesiologist, CRNA and Surgeon  Anesthesia Plan Comments: (Patient consented for risks of anesthesia including but not limited to:  - adverse reactions to medications - risk of airway placement if required - damage to eyes, teeth, lips or other oral mucosa - nerve damage due to positioning  - sore throat or hoarseness - Damage to heart, brain, nerves, lungs, other parts of body or loss of life  Patient voiced understanding and assent.)         Anesthesia Quick Evaluation

## 2023-03-24 NOTE — H&P (Addendum)
 Arlyss Repress, MD [address]  [address]  [place], [place] [address]  Main: [phone]  Fax: [phone] Pager: [phone]  Primary Care Physician:  [name], MD Primary Gastroenterologist:  Dr. Arlyss Repress  Pre-Procedure History & Physical: HPI:  Heidi [name] is a 48 y.o. female is here for an colonoscopy.   Past Medical History:  Diagnosis Date   Anxiety    Asthma    Body piercing    Tragus piercing.  Pt reports CANNOT be removed   Clotting disorder (HCC)    DVT   Depression    DVT (deep venous thrombosis) (HCC) 2004   Family history of colon cancer in mother    Family history of ovarian cancer    4/21 cancer genetic tesitng letter sent   Herpes genitalis    Lupus    MVP (mitral valve prolapse)    Dx'd "many years ago". No cardiologist.   Neck stiffness    bulging discs   Restless leg syndrome    Wears contact lenses     Past Surgical History:  Procedure Laterality Date   ABLATION     COLONOSCOPY  2011   ENDOMETRIAL BIOPSY     ESOPHAGOGASTRODUODENOSCOPY (EGD) WITH PROPOFOL N/A 03/10/2021   Procedure: ESOPHAGOGASTRODUODENOSCOPY (EGD) WITH PROPOFOL;  Surgeon: Toney Reil, MD;  Location: [facility] ENDOSCOPY;  Service: Gastroenterology;  Laterality: N/A;   NASAL SEPTUM SURGERY     NOSE SURGERY  1995   deviated nose septum   TONSILLECTOMY  1995   TUBAL LIGATION  2009    Prior to Admission medications   Medication Sig Start Date End Date Taking? Authorizing Provider  cyanocobalamin (VITAMIN B12) 1000 MCG tablet Take 1,000 mcg by mouth in the morning and at bedtime.   Yes [provider]  diclofenac (VOLTAREN) 75 MG EC tablet TAKE 1 TABLET BY MOUTH TWICE A DAY AS NEEDED 02/18/23  Yes [name], MD  gabapentin (NEURONTIN) 300 MG capsule Take 1-2 capsules (300-600 mg total) by mouth at bedtime. 02/21/23  Yes [name], MD  lamoTRIgine (LAMICTAL) 200 MG tablet Take by mouth every morning. 01/20/21  Yes [provider]  sertraline (ZOLOFT) 50 MG tablet Take 50 mg by mouth daily. 12/15/22  Yes [provider]  valACYclovir (VALTREX) 1000 MG tablet TAKE 1 TABLET BY MOUTH EVERY DAY 09/10/20  Yes Vena Austria, MD  pregabalin (LYRICA) 75 MG capsule Take 1 capsule (75 mg total) by mouth 2 (two) times daily. Continue current dose of gabapentin at bedtime Patient not taking: Reported on 03/15/2023 01/16/23   [name], MD    Allergies as of 03/07/2023 - Review Complete 01/16/2023  Allergen Reaction Noted   Zyprexa [olanzapine]  04/03/2020    Family History  Problem Relation Age of Onset   Cancer Mother 28       colon, rectal   Diabetes Father    Bladder Cancer Maternal Grandmother    Bladder Cancer Maternal Grandfather    Uterine cancer Maternal Aunt     Social History   Socioeconomic History   Marital status: Divorced    Spouse name: Not on file   Number of children: Not on file   Years of education: Not on file   Highest education level: Master's degree (e.g., MA, MS, MEng, MEd, MSW, MBA)  Occupational History   Not on file  Tobacco Use   Smoking status: Every Day    Current packs/day: 0.25    Average packs/day:  0.3 packs/day for 11.2 years (3.0 ttl pk-yrs)    Types: Cigarettes    Start date: 11/29/1992    Last attempt to quit: 11/29/1993   Smokeless tobacco: Never  Vaping Use   Vaping status: Never Used  Substance and Sexual Activity   Alcohol use: Yes    Comment: occasional   Drug use: No   Sexual activity: Yes    Birth control/protection: Surgical  Other Topics Concern   Not on file  Social History Narrative   Not on file   Social Drivers of Health   Financial Resource Strain: Low Risk  (03/13/2023)   Received from [facility]   Overall Financial Resource Strain (CARDIA)    Difficulty of Paying Living Expenses: Not hard at all  Food Insecurity: No Food Insecurity (03/13/2023)   Received from [facility]   Hunger  Vital Sign    Worried About Running Out of Food in the Last Year: Never true    Ran Out of Food in the Last Year: Never true  Transportation Needs: No Transportation Needs (03/13/2023)   Received from [facility] - Transportation    In the past 12 months, has lack of transportation kept you from medical appointments or from getting medications?: No    Lack of Transportation (Non-Medical): No  Physical Activity: Unknown (05/08/2022)   Exercise Vital Sign    Days of Exercise per Week: 0 days    Minutes of Exercise per Session: Not on file  Stress: No Stress Concern Present (05/08/2022)   [employer] of Occupational Health - Occupational Stress Questionnaire    Feeling of Stress : Only a little  Social Connections: Socially Isolated (05/08/2022)   Social Connection and Isolation Panel [NHANES]    Frequency of Communication with Friends and Family: More than three times a week    Frequency of Social Gatherings with Friends and Family: Once a week    Attends Religious Services: Never    Database administrator or Organizations: No    Attends Engineer, structural: Not on file    Marital Status: Divorced  Catering manager Violence: Not on file    Review of Systems: See HPI, otherwise negative ROS  Physical Exam: BP 113/87   Pulse 61   Temp 98.4 F (36.9 C) (Temporal)   Resp 16   Ht 5\' 8"  (1.727 m)   Wt 76.2 kg   LMP 03/09/2023 (Exact Date)   SpO2 98%   BMI 25.54 kg/m  General:   Alert,  pleasant and cooperative in NAD Head:  Normocephalic and atraumatic. Neck:  Supple; no masses or thyromegaly. Lungs:  Clear throughout to auscultation.    Heart:  Regular rate and rhythm. Abdomen:  Soft, nontender and nondistended. Normal bowel sounds, without guarding, and without rebound.   Neurologic:  Alert and  oriented x4;  grossly normal neurologically.  Impression/Plan: Heidi [name] is here for an colonoscopy to be performed for family h/o  colon cancer  Risks, benefits, limitations, and alternatives regarding  colonoscopy have been reviewed with the patient.  Questions have been answered.  All parties agreeable.   Lannette Donath, MD  03/24/2023, 8:22 AM

## 2023-03-24 NOTE — Anesthesia Postprocedure Evaluation (Signed)
 Anesthesia Post Note  Patient: Heidi [name]  Procedure(s) Performed: COLONOSCOPY WITH PROPOFOL (Rectum) POLYPECTOMY (Rectum)  Patient location during evaluation: PACU Anesthesia Type: General Level of consciousness: awake and alert Pain management: pain level controlled Vital Signs Assessment: post-procedure vital signs reviewed and stable Respiratory status: spontaneous breathing, nonlabored ventilation, respiratory function stable and patient connected to nasal cannula oxygen Cardiovascular status: blood pressure returned to baseline and stable Postop Assessment: no apparent nausea or vomiting Anesthetic complications: no   No notable events documented.   Last Vitals:  Vitals:   03/24/23 0850 03/24/23 0855  BP: 104/73   Pulse: (!) 59 (!) 59  Resp: 14 18  Temp:    SpO2: 96% 99%    Last Pain:  Vitals:   03/24/23 0846  TempSrc:   PainSc: 0-No pain                 [name] C [name]

## 2023-03-24 NOTE — Transfer of Care (Signed)
 Immediate Anesthesia Transfer of Care Note  Patient: Heidi [name]  Procedure(s) Performed: COLONOSCOPY WITH PROPOFOL (Rectum) POLYPECTOMY (Rectum)  Patient Location: PACU  Anesthesia Type: General  Level of Consciousness: awake, alert  and patient cooperative  Airway and Oxygen Therapy: Patient Spontanous Breathing and Patient connected to supplemental oxygen  Post-op Assessment: Post-op Vital signs reviewed, Patient's Cardiovascular Status Stable, Respiratory Function Stable, Patent Airway and No signs of Nausea or vomiting  Post-op Vital Signs: Reviewed and stable  Complications: No notable events documented.

## 2023-03-24 NOTE — Op Note (Signed)
 [facility] Gastroenterology Patient Name: Heidi [name] Procedure Date: 03/24/2023 8:19 AM MRN: [id number] Account #: [id number] Date of Birth: [birth year] Admit Type: Outpatient Age: 48 Room: [facility] OR ROOM 01 Gender: Female Note Status: Finalized Instrument Name: [id number] Procedure:             Colonoscopy Indications:           Screening in patient at increased risk: Colorectal                         cancer in mother before age 36 Providers:             Steven Veazie Alger Memos MD, MD Referring MD:          Demetrios Isaacs. Sherrie Mustache, MD (Referring MD) Medicines:             General Anesthesia Complications:         No immediate complications. Estimated blood loss: None. Procedure:             Pre-Anesthesia Assessment:                        - Prior to the procedure, a History and Physical was                         performed, and patient medications and allergies were                         reviewed. The patient is competent. The risks and                         benefits of the procedure and the sedation options and                         risks were discussed with the patient. All questions                         were answered and informed consent was obtained.                         Patient identification and proposed procedure were                         verified by the physician, the nurse, the                         anesthesiologist, the anesthetist and the technician                         in the pre-procedure area in the procedure room in the                         endoscopy suite. Mental Status Examination: alert and                         oriented. Airway Examination: normal oropharyngeal                         airway and neck mobility. Respiratory Examination:  clear to auscultation. CV Examination: normal.                         Prophylactic Antibiotics: The patient does not require                         prophylactic  antibiotics. Prior Anticoagulants: The                         patient has taken no anticoagulant or antiplatelet                         agents. ASA Grade Assessment: III - A patient with                         severe systemic disease. After reviewing the risks and                         benefits, the patient was deemed in satisfactory                         condition to undergo the procedure. The anesthesia                         plan was to use general anesthesia. Immediately prior                         to administration of medications, the patient was                         re-assessed for adequacy to receive sedatives. The                         heart rate, respiratory rate, oxygen saturations,                         blood pressure, adequacy of pulmonary ventilation, and                         response to care were monitored throughout the                         procedure. The physical status of the patient was                         re-assessed after the procedure.                        After obtaining informed consent, the colonoscope was                         passed under direct vision. Throughout the procedure,                         the patient's blood pressure, pulse, and oxygen                         saturations were monitored continuously. The  Colonoscope was introduced through the anus and                         advanced to the the cecum, identified by appendiceal                         orifice and ileocecal valve. The colonoscopy was                         performed without difficulty. The patient tolerated                         the procedure well. The quality of the bowel                         preparation was evaluated using the BBPS [facility] Bowel                         Preparation Scale) with scores of: Right Colon = 3,                         Transverse Colon = 3 and Left Colon = 3 (entire mucosa                         seen  well with no residual staining, small fragments                         of stool or opaque liquid). The total BBPS score                         equals 9. The ileocecal valve, appendiceal orifice,                         and rectum were photographed. Findings:      The perianal and digital rectal examinations were normal. Pertinent       negatives include normal sphincter tone and no palpable rectal lesions.      A 4 mm polyp was found in the proximal ascending colon. The polyp was       sessile. The polyp was removed with a cold snare. Resection and       retrieval were complete. Estimated blood loss: none.      The retroflexed view of the distal rectum and anal verge was normal and       showed no anal or rectal abnormalities.      The exam was otherwise without abnormality. Impression:            - One 4 mm polyp in the proximal ascending colon,                         removed with a cold snare. Resected and retrieved.                        - The distal rectum and anal verge are normal on                         retroflexion view.                        -  The examination was otherwise normal. Recommendation:        - Discharge patient to home (with escort).                        - Resume previous diet today.                        - Continue present medications.                        - Await pathology results.                        - Repeat colonoscopy in 5 years for surveillance. Procedure Code(s):     --- Professional ---                        [id number], Colonoscopy, flexible; with removal of                         tumor(s), polyp(s), or other lesion(s) by snare                         technique Diagnosis Code(s):     --- Professional ---                        Z80.0, Family history of malignant neoplasm of                         digestive organs                        D12.2, Benign neoplasm of ascending colon CPT copyright 2022 American Medical Association. All rights  reserved. The codes documented in this report are preliminary and upon coder review may  be revised to meet current compliance requirements. Dr. Libby Maw Toney Reil MD, MD 03/24/2023 8:45:51 AM This report has been signed electronically. Number of Addenda: 0 Note Initiated On: 03/24/2023 8:19 AM Scope Withdrawal Time: 0 hours 11 minutes 36 seconds  Total Procedure Duration: 0 hours 13 minutes 31 seconds  Estimated Blood Loss:  Estimated blood loss: none.      [facility]

## 2023-03-25 ENCOUNTER — Encounter: Payer: Self-pay | Admitting: Gastroenterology

## 2023-03-27 ENCOUNTER — Encounter: Payer: Self-pay | Admitting: Family Medicine

## 2023-03-27 LAB — SURGICAL PATHOLOGY

## 2023-03-28 ENCOUNTER — Encounter: Payer: Self-pay | Admitting: Gastroenterology

## 2023-03-29 NOTE — Telephone Encounter (Signed)
Order the lab work

## 2023-04-06 LAB — FOOD ALLERGY PROFILE
Allergen Corn, IgE: <0.1 kU/L
Clam IgE: <0.1 kU/L
Codfish IgE: <0.1 kU/L
Egg White IgE: <0.1 kU/L
Milk IgE: <0.1 kU/L
Peanut IgE: <0.1 kU/L
Scallop IgE: <0.1 kU/L
Sesame Seed IgE: <0.1 kU/L
Shrimp IgE: <0.1 kU/L
Soybean IgE: <0.1 kU/L
Walnut IgE: <0.1 kU/L
Wheat IgE: <0.1 kU/L

## 2023-04-06 LAB — ALPHA-GAL PANEL
Allergen Lamb IgE: <0.1 kU/L
Beef IgE: <0.1 kU/L
IgE (Immunoglobulin E), Serum: 89 [IU]/mL (ref 6–495)
O215-IgE Alpha-Gal: <0.1 kU/L
Pork IgE: <0.1 kU/L

## 2023-04-17 NOTE — Telephone Encounter (Signed)
 I called [facility] the lady that I spoke to said she did not know of any test they had specifically for Nickel allergy. I Google online and found that two lab test for Nickel allergy were Metal-LTT: or Nickel Lymphocyte Proliferation Test (NiLPT): they said they do not have either one of these test.

## 2023-04-17 NOTE — Telephone Encounter (Signed)
 Could not find test at [facility] either

## 2023-08-15 ENCOUNTER — Other Ambulatory Visit: Payer: Self-pay | Admitting: Family Medicine

## 2023-08-15 DIAGNOSIS — M359 Systemic involvement of connective tissue, unspecified: Secondary | ICD-10-CM

## 2023-09-14 NOTE — Progress Notes (Signed)
 Referring Physician:  Carlisle Benton CROME, FNP [address] [place],  [place] [address]  Primary Physician:  Gasper Nancyann BRAVO, MD  History of Present Illness: 09/18/2023 [name] is here today with a chief complaint of low back pain that radiates down bilateral posterior legs to the area right above her ankles.  It alternates which leg is worse than the other.  The shooting pain down her legs is intermittent and worse in the mornings and at night.  She denies any numbness, but states that she has tingling down the back of her legs.  She denies weakness, but has been having some calf pain and cramping.  She did have some relief with injections previously.  No saddle anesthesia or issues with incontinence.  She does have some temporary relief when leaning forward.   Duration: 1 year, pain in legs started about 7 months ago  Severity: 2/10 to 10/10  Precipitating: aggravated by any movement or daily activities  Modifying factors: made better by rest Weakness: none Timing: constant Bowel/Bladder Dysfunction: none  Conservative measures:  Physical therapy: did some home exercises, without relief Multimodal medical therapy including regular antiinflammatories: Gabapentin , Diclofenac  Injections: 08/25/2023: Bilateral S1 transforaminal ESI (no relief, made the pain worse, burning in calves) 05/05/2023: Bilateral S1 transforaminal ESI (50% relief) 03/31/2023: Bilateral S1 transforaminal ESI (70% relief)   Past Surgery: no spinal surgeries   Almarie Later has no symptoms of cervical myelopathy.  The symptoms are causing a significant impact on the patient's life.   Review of Systems:  A 10 point review of systems is negative, except for the pertinent positives and negatives detailed in the HPI.  Past Medical History: Past Medical History:  Diagnosis Date   Anxiety    Asthma    Body piercing    Tragus piercing.  Pt reports CANNOT be removed   Clotting disorder  (HCC)    DVT   Depression    DVT (deep venous thrombosis) (HCC) 2004   Family history of colon cancer in mother    Family history of ovarian cancer    4/21 cancer genetic tesitng letter sent   Herpes genitalis    Lupus    MVP (mitral valve prolapse)    Dx'd many years ago. No cardiologist.   Neck stiffness    bulging discs   Restless leg syndrome    Wears contact lenses     Past Surgical History: Past Surgical History:  Procedure Laterality Date   ABLATION     COLONOSCOPY  2011   COLONOSCOPY WITH PROPOFOL  N/A 03/24/2023   Procedure: COLONOSCOPY WITH PROPOFOL ;  Surgeon: Unk Corinn Skiff, MD;  Location: [facility];  Service: Endoscopy;  Laterality: N/A;   ENDOMETRIAL BIOPSY     ESOPHAGOGASTRODUODENOSCOPY (EGD) WITH PROPOFOL  N/A 03/10/2021   Procedure: ESOPHAGOGASTRODUODENOSCOPY (EGD) WITH PROPOFOL ;  Surgeon: Unk Corinn Skiff, MD;  Location: [facility];  Service: Gastroenterology;  Laterality: N/A;   NASAL SEPTUM SURGERY     NOSE SURGERY  1995   deviated nose septum   POLYPECTOMY  03/24/2023   Procedure: POLYPECTOMY;  Surgeon: Unk Corinn Skiff, MD;  Location: [facility];  Service: Endoscopy;;   TONSILLECTOMY  1995   TUBAL LIGATION  2009    Allergies: Allergies as of 09/18/2023 - Review Complete 09/18/2023  Allergen Reaction Noted   Antihistamines, chlorpheniramine-type  03/15/2023   Sleep aid [diphenhydramine hcl]  03/15/2023   Zyprexa [olanzapine]  04/03/2020    Medications: Outpatient Encounter Medications as of 09/18/2023  Medication Sig   cyanocobalamin (VITAMIN B12) 1000 MCG tablet Take 1,000 mcg by mouth in the morning and at bedtime.   diclofenac  (VOLTAREN ) 75 MG EC tablet TAKE 1 TABLET BY MOUTH TWICE A DAY AS NEEDED   gabapentin  (NEURONTIN ) 300 MG capsule Take 1-2 capsules (300-600 mg total) by mouth at bedtime.   lamoTRIgine (LAMICTAL) 200 MG tablet Take by mouth every morning.   sertraline (ZOLOFT) 50 MG tablet Take 50 mg by mouth  daily.   valACYclovir  (VALTREX ) 1000 MG tablet TAKE 1 TABLET BY MOUTH EVERY DAY   No facility-administered encounter medications on file as of 09/18/2023.    Social History: Social History   Tobacco Use   Smoking status: Every Day    Current packs/day: 0.25    Average packs/day: 0.3 packs/day for 11.7 years (3.2 ttl pk-yrs)    Types: Cigarettes    Start date: 11/29/1992    Last attempt to quit: 11/29/1993   Smokeless tobacco: Never  Vaping Use   Vaping status: Never Used  Substance Use Topics   Alcohol use: Yes    Comment: occasional   Drug use: No    Family Medical History: Family History  Problem Relation Age of Onset   Cancer Mother 24       colon, rectal   Diabetes Father    Bladder Cancer Maternal Grandmother    Bladder Cancer Maternal Grandfather    Uterine cancer Maternal Aunt     Physical Examination: @VITALWITHPAIN @  General: Patient is well developed, well nourished, calm, collected, and in no apparent distress. Attention to examination is appropriate.  Psychiatric: Patient is non-anxious.  Head:  Pupils equal, round, and reactive to light.  ENT:  Oral mucosa appears well hydrated.  Neck:   Supple.  Full range of motion.  Respiratory: Patient is breathing without any difficulty.  Extremities: No edema.  Vascular: Palpable dorsal pedal pulses.  Skin:   On exposed skin, there are no abnormal skin lesions.  NEUROLOGICAL:     Awake, alert, oriented to person, place, and time.  Speech is clear and fluent. Fund of knowledge is appropriate.   Cranial Nerves: Pupils equal round and reactive to light.  Facial tone is symmetric.   ROM of spine: Tenderness to palpation of lumbar paraspinals.    Strength:  Side Iliopsoas Quads Hamstring PF DF EHL  R 5 5 5 5 5 5   L 5 5 5 5 5 5     Absent Achilles reflex.  2+ bilateral patellar reflex.  Negative straight leg raise.  No clonus. Bilateral upper and lower extremity sensation is intact to light touch.     Gait is normal.   No difficulty with tandem gait.   No evidence of dysmetria noted.  Medical Decision Making  Imaging: EXAM: MRI LUMBAR SPINE WITHOUT CONTRAST   TECHNIQUE: Multiplanar, multisequence MR imaging of the lumbar spine was performed. No intravenous contrast was administered.   COMPARISON:  Lumbar spine radiographs 05/09/2022   FINDINGS: Segmentation: 5 non rib-bearing lumbar type vertebral bodies are present. The lowest fully formed vertebral body is L5.   Alignment: Slight degenerative anterolisthesis is present at L4-5. Minimal retrolisthesis is present at L1-2, L2-3 and L3-4. Normal lumbar lordosis is present.   Vertebrae: Type 2 Modic changes are present bilaterally at L5-S1. Marrow signal and vertebral body heights are otherwise normal. Hemangioma is present posteriorly at T11.   Conus medullaris and cauda equina: Conus extends to the T12-L1 level. Conus and cauda equina appear normal.  Paraspinal and other soft tissues: Limited imaging the abdomen is unremarkable. There is no significant adenopathy. No solid organ lesions are present.   Disc levels:   Disc levels at L1-2 and above are normal.   L2-3: Normal disc signal and height is present. No focal protrusion or stenosis is present.   L3-4: Mild facet hypertrophy is present bilaterally. A shallow central disc protrusion is present. No focal stenosis is present.   L4-5: A moderate central disc protrusion is present. Moderate facet hypertrophy and ligamentum flavum thickening is present bilaterally. This leads to moderate subarticular stenosis bilaterally, right greater than left. Mild foraminal narrowing is also present bilaterally.   L5-S1: A central disc protrusion is present. Disc material extends into the foramina bilaterally. A right synovial cyst contributes to moderate right subarticular stenosis. Mild left subarticular stenosis is present. Mild right foraminal narrowing is present.    IMPRESSION: 1. Moderate subarticular stenosis bilaterally at L4-5, right greater than left. 2. Mild foraminal narrowing bilaterally at L4-5. 3. Moderate right subarticular stenosis at L5-S1 secondary to a right synovial cyst. 4. Mild left subarticular stenosis and mild right foraminal narrowing at L5-S1. 5. Mild facet hypertrophy bilaterally at L3-4 without stenosis.    External EMG report was normal   I have personally reviewed the images and agree with the above interpretation.  Assessment and Plan: Ms. Benavidez is a pleasant 48 y.o. female with known moderate subarticular stenosis bilaterally with low back pain radiating down the posterior aspect of bilateral lower extremities for approximately 7 months.  She has undergone injections which initially helped, but her back pain has persisted.  She also has burning in her calves.  EMG report was normal, concern for neurogenic claudication.  Plan includes the following without forward:  -Flexion and extension x-rays of lumbar spine evaluate for listhesis. - Patient has been doing online physical therapy exercises, but I expressed to her that I need her to do undergo formal physical therapy.  She is in agreement and referral has been sent. - Gabapentin  does help at night.  We discussed taking gabapentin  in the morning to help give her some pain coverage during the day.  We will trial this. - Patient has family that has had surgery Dr. Katrina and that is who she would like to follow-up with in the future.  Plan to see back in approximately 8 weeks when she has undergone at least 6 weeks of physical therapy.   Thank you for involving me in the care of this patient.   I spent a total of 45 minutes in both face-to-face and non-face-to-face activities for this visit on the date of this encounter including preparing to see the patient, obtaining and reviewing separately obtained history, performing medically appropriate examination, counseling  the patient, ordering additional medications and tests, documenting clinical information, independently interpreting results, coordination of care.   Lyle Decamp, PA-C Dept. of Neurosurgery

## 2023-09-18 ENCOUNTER — Encounter: Payer: Self-pay | Admitting: Physician Assistant

## 2023-09-18 ENCOUNTER — Ambulatory Visit
Admission: RE | Admit: 2023-09-18 | Discharge: 2023-09-18 | Disposition: A | Discharge status: Home / Self Care | Source: Ambulatory Visit | Attending: Physician Assistant | Admitting: Physician Assistant

## 2023-09-18 ENCOUNTER — Ambulatory Visit: Admitting: Physician Assistant

## 2023-09-18 VITALS — BP 126/78 | Ht 68.0 in | Wt 176.0 lb

## 2023-09-18 DIAGNOSIS — M48062 Spinal stenosis, lumbar region with neurogenic claudication: Secondary | ICD-10-CM

## 2023-09-18 DIAGNOSIS — M48061 Spinal stenosis, lumbar region without neurogenic claudication: Secondary | ICD-10-CM | POA: Diagnosis not present

## 2023-09-18 MED ORDER — GABAPENTIN 300 MG PO CAPS: 300.0000 mg | ORAL_CAPSULE | Freq: Once | ORAL | 2 refills | Status: DC

## 2023-10-18 NOTE — Progress Notes (Signed)
 Referring Physician:  Gasper Nancyann BRAVO, MD [address] [place],  [place] [address]  Primary Physician:  Gasper Nancyann BRAVO, MD  History of Present Illness: 10/24/2023 [name] is here today with a chief complaint of worsening lower back and right hip pain.  She has experienced lower back pain for approximately a year and a half, initially manageable with morning stiffness. Significant pain developed in her right hip earlier this year. Imaging, including x-rays and CT scans, revealed arthritic changes and slippage in her spine. Physical therapy was attempted but was ineffective, leading to her discharge from the program.  She describes the pain as a burning and aching sensation in her legs, worsening throughout the day, particularly with prolonged standing or activity. The left leg is more affected by sciatica than the right.  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  She has a history of autoimmune issues, which she believes may be linked to Essure tubal ligation implants received in 2008.   Heidi [name] has no symptoms of cervical myelopathy.  The symptoms are causing a significant impact on the patient's life.   I have utilized the care everywhere function in epic to review the outside records available from external health systems.   09/18/2023 Note from [place], [place] [name] is here today with a chief complaint of low back pain that radiates down bilateral posterior legs to the area right above her ankles.  It alternates which leg is worse than the other.  The shooting pain down her legs is intermittent and worse in the mornings and at night.  She denies any numbness, but states that she has tingling down the back of her legs.  She denies weakness, but has been having some calf pain and cramping.  She did have some relief with injections previously.  No saddle anesthesia or issues  with incontinence.  She does have some temporary relief when leaning forward.     Duration: 1 year, pain in legs started about 7 months ago  Severity: 2/10 to 10/10  Precipitating: aggravated by any movement or daily activities  Modifying factors: made better by rest Weakness: none Timing: constant Bowel/Bladder Dysfunction: none   Conservative measures:  Physical therapy: did some home exercises, without relief Multimodal medical therapy including regular antiinflammatories: Gabapentin , Diclofenac  Injections: 08/25/2023: Bilateral S1 transforaminal ESI (no relief, made the pain worse, burning in calves) 05/05/2023: Bilateral S1 transforaminal ESI (50% relief) 03/31/2023: Bilateral S1 transforaminal ESI (70% relief)    Past Surgery: no spinal surgeries   Review of Systems:  A 10 point review of systems is negative, except for the pertinent positives and negatives detailed in the HPI.  Past Medical History: Past Medical History:  Diagnosis Date   Anxiety    Asthma    Body piercing    Tragus piercing.  Pt reports CANNOT be removed   Clotting disorder    DVT   Depression    DVT (deep venous thrombosis) (HCC) 2004   Family history of colon cancer in mother    Family history of ovarian cancer    4/21 cancer genetic tesitng letter sent   Herpes genitalis    Lupus    MVP (mitral valve prolapse)    Dx'd many years ago. No cardiologist.   Neck stiffness    bulging discs   Restless leg syndrome    Wears contact lenses     Past Surgical History: Past Surgical  History:  Procedure Laterality Date   ABLATION     COLONOSCOPY  2011   COLONOSCOPY WITH PROPOFOL  N/A 03/24/2023   Procedure: COLONOSCOPY WITH PROPOFOL ;  Surgeon: Unk Corinn Skiff, MD;  Location: [facility];  Service: Endoscopy;  Laterality: N/A;   ENDOMETRIAL BIOPSY     ESOPHAGOGASTRODUODENOSCOPY (EGD) WITH PROPOFOL  N/A 03/10/2021   Procedure: ESOPHAGOGASTRODUODENOSCOPY (EGD) WITH PROPOFOL ;  Surgeon:  Unk Corinn Skiff, MD;  Location: [facility];  Service: Gastroenterology;  Laterality: N/A;   NASAL SEPTUM SURGERY     NOSE SURGERY  1995   deviated nose septum   POLYPECTOMY  03/24/2023   Procedure: POLYPECTOMY;  Surgeon: Unk Corinn Skiff, MD;  Location: [facility];  Service: Endoscopy;;   TONSILLECTOMY  1995   TUBAL LIGATION  2009    Allergies: Allergies as of 10/24/2023 - Review Complete 10/24/2023  Allergen Reaction Noted   Antihistamines, chlorpheniramine-type  03/15/2023   Sleep aid [diphenhydramine hcl]  03/15/2023   Zyprexa [olanzapine]  04/03/2020    Medications:  Current Outpatient Medications:    cyanocobalamin (VITAMIN B12) 1000 MCG tablet, Take 1,000 mcg by mouth in the morning and at bedtime., Disp: , Rfl:    diclofenac  (VOLTAREN ) 75 MG EC tablet, TAKE 1 TABLET BY MOUTH TWICE A DAY AS NEEDED, Disp: 180 tablet, Rfl: 0   gabapentin  (NEURONTIN ) 300 MG capsule, Take 1-2 capsules (300-600 mg total) by mouth at bedtime., Disp: 180 capsule, Rfl: 3   lamoTRIgine (LAMICTAL) 200 MG tablet, Take by mouth every morning., Disp: , Rfl:    sertraline (ZOLOFT) 50 MG tablet, Take 50 mg by mouth daily., Disp: , Rfl:    valACYclovir  (VALTREX ) 1000 MG tablet, TAKE 1 TABLET BY MOUTH EVERY DAY, Disp: 90 tablet, Rfl: 1  Social History: Social History   Tobacco Use   Smoking status: Every Day    Current packs/day: 0.25    Average packs/day: 0.3 packs/day for 11.8 years (3.2 ttl pk-yrs)    Types: Cigarettes    Start date: 11/29/1992    Last attempt to quit: 11/29/1993   Smokeless tobacco: Never  Vaping Use   Vaping status: Never Used  Substance Use Topics   Alcohol use: Yes    Comment: occasional   Drug use: No    Family Medical History: Family History  Problem Relation Age of Onset   Cancer Mother 63       colon, rectal   Diabetes Father    Bladder Cancer Maternal Grandmother    Bladder Cancer Maternal Grandfather    Uterine cancer Maternal Aunt      Physical Examination: Vitals:   10/24/23 0851  BP: 136/80    General: Patient is in no apparent distress. Attention to examination is appropriate.  Neck:   Supple.  Full range of motion.  Respiratory: Patient is breathing without any difficulty.   NEUROLOGICAL:     Awake, alert, oriented to person, place, and time.  Speech is clear and fluent.   Cranial Nerves: Pupils equal round and reactive to light.  Facial tone is symmetric.  Facial sensation is symmetric. Shoulder shrug is symmetric. Tongue protrusion is midline.  There is no pronator drift.  Strength: Side Biceps Triceps Deltoid Interossei Grip Wrist Ext. Wrist Flex.  R 5 5 5 5 5 5 5   L 5 5 5 5 5 5 5    Side Iliopsoas Quads Hamstring PF DF EHL  R 5 5 5 5 5 5   L 5 5 5 5 5 5    Reflexes  are 1+ and symmetric at the biceps, triceps, brachioradialis, patella and achilles.   Hoffman's is absent.   Bilateral upper and lower extremity sensation is intact to light touch.    No evidence of dysmetria noted.  Gait is normal.     Medical Decision Making  Imaging: MR L spine 03/17/2023 IMPRESSION: 1. Moderate subarticular stenosis bilaterally at L4-5, right greater than left. 2. Mild foraminal narrowing bilaterally at L4-5. 3. Moderate right subarticular stenosis at L5-S1 secondary to a right synovial cyst. 4. Mild left subarticular stenosis and mild right foraminal narrowing at L5-S1. 5. Mild facet hypertrophy bilaterally at L3-4 without stenosis.     Electronically Signed   By: Lonni Necessary M.D.   On: 03/17/2023 14:46   X-rays on September 18, 2023 shows substantial L4 and L5 anterolisthesis.  She has approximately 5 mm of listhesis on extension that increases to 10 mm on flexion.  This is in comparison to her MRI scan, which has minimal anterolisthesis.  I have personally reviewed the images and agree with the above interpretation.  Assessment and Plan: [name] is a pleasant 48 y.o. female with  spondylolisthesis of the lumbar spine worst at L4-5.  She also has significant degenerative disc disease at L5-S1.  She has stenosis at L4-5 secondary to this.  On her MRI scan, she has minimal anterolisthesis.  This increases to approximately 10 mm on the flexion component of her standing x-rays.  There is clear evidence of lumbar instability.  She has failed physical therapy.  I think she is an appropriate candidate to consider L4-5 lateral lumbar interbody fusion with percutaneous fixation.  However, she will need to quit using nicotine products.  We discussed this at length.  She will let me know when she is off nicotine for 30 days and then we will schedule.   I spent a total of 30 minutes in this patient's care today. This time was spent reviewing pertinent records including imaging studies, obtaining and confirming history, performing a directed evaluation, formulating and discussing my recommendations, and documenting the visit within the medical record.     Thank you for involving me in the care of this patient.      Tykeria Wawrzyniak K. Clois MD, [facility] Neurosurgery

## 2023-10-24 ENCOUNTER — Ambulatory Visit: Admitting: Neurosurgery

## 2023-10-24 ENCOUNTER — Encounter: Payer: Self-pay | Admitting: Neurosurgery

## 2023-10-24 VITALS — BP 136/80 | Ht 68.0 in | Wt 177.0 lb

## 2023-10-24 DIAGNOSIS — M532X6 Spinal instabilities, lumbar region: Secondary | ICD-10-CM

## 2023-10-24 DIAGNOSIS — M48061 Spinal stenosis, lumbar region without neurogenic claudication: Secondary | ICD-10-CM | POA: Diagnosis not present

## 2023-10-24 DIAGNOSIS — M51379 Other intervertebral disc degeneration, lumbosacral region without mention of lumbar back pain or lower extremity pain: Secondary | ICD-10-CM | POA: Diagnosis not present

## 2023-10-24 DIAGNOSIS — M4316 Spondylolisthesis, lumbar region: Secondary | ICD-10-CM | POA: Diagnosis not present

## 2023-10-24 DIAGNOSIS — G8929 Other chronic pain: Secondary | ICD-10-CM

## 2023-10-29 ENCOUNTER — Encounter: Payer: Self-pay | Admitting: Neurosurgery

## 2023-10-29 DIAGNOSIS — Z87891 Personal history of nicotine dependence: Secondary | ICD-10-CM

## 2023-10-30 ENCOUNTER — Other Ambulatory Visit: Payer: Self-pay | Admitting: Physician Assistant

## 2023-10-30 DIAGNOSIS — G2581 Restless legs syndrome: Secondary | ICD-10-CM

## 2023-10-30 MED ORDER — GABAPENTIN 300 MG PO CAPS: ORAL_CAPSULE | ORAL | 3 refills | Status: AC

## 2023-10-30 MED ORDER — GABAPENTIN 300 MG PO CAPS: 300.0000 mg | ORAL_CAPSULE | Freq: Two times a day (BID) | ORAL | 3 refills | Status: DC

## 2023-10-30 NOTE — Progress Notes (Signed)
 First RX for Gabapentin  sent to [facility] had 2 different directions attached to it. Spoke with [facility] and they asked to re send this RX with correct directions and they will cancel any other Gabapentin  RXs on file at this time. I spoke with Lyle and received ok to re send RX with correct information

## 2023-10-30 NOTE — Addendum Note (Signed)
 Addended by: GIRARD DON GAILS on: 10/30/2023 09:53 AM   Modules accepted: Orders

## 2023-11-11 ENCOUNTER — Other Ambulatory Visit: Payer: Self-pay | Admitting: Physician Assistant

## 2023-11-11 DIAGNOSIS — M359 Systemic involvement of connective tissue, unspecified: Secondary | ICD-10-CM

## 2023-11-13 NOTE — Telephone Encounter (Signed)
 Probably not a bad idea to have her seen for a good exam to make sure she does not have weakness.   Please schedule her to see any PA and next available follow up.

## 2023-11-14 ENCOUNTER — Ambulatory Visit: Admitting: Neurosurgery

## 2023-11-14 ENCOUNTER — Other Ambulatory Visit: Payer: Self-pay | Admitting: Physician Assistant

## 2023-11-14 MED ORDER — TIZANIDINE HCL 4 MG PO TABS: 2.0000 mg | ORAL_TABLET | Freq: Three times a day (TID) | ORAL | 1 refills | Status: DC | PRN

## 2023-11-20 ENCOUNTER — Other Ambulatory Visit
Admission: RE | Admit: 2023-11-20 | Discharge: 2023-11-20 | Disposition: A | Discharge status: Home / Self Care | Source: Ambulatory Visit | Attending: Neurosurgery | Admitting: Neurosurgery

## 2023-11-20 ENCOUNTER — Ambulatory Visit: Admitting: Physician Assistant

## 2023-11-20 ENCOUNTER — Encounter: Payer: Self-pay | Admitting: Physician Assistant

## 2023-11-20 VITALS — BP 128/78 | Ht 68.0 in | Wt 177.0 lb

## 2023-11-20 DIAGNOSIS — M51379 Other intervertebral disc degeneration, lumbosacral region without mention of lumbar back pain or lower extremity pain: Secondary | ICD-10-CM | POA: Diagnosis not present

## 2023-11-20 DIAGNOSIS — Z87891 Personal history of nicotine dependence: Secondary | ICD-10-CM | POA: Diagnosis present

## 2023-11-20 DIAGNOSIS — M4316 Spondylolisthesis, lumbar region: Secondary | ICD-10-CM

## 2023-11-20 DIAGNOSIS — M48061 Spinal stenosis, lumbar region without neurogenic claudication: Secondary | ICD-10-CM

## 2023-11-20 DIAGNOSIS — M532X6 Spinal instabilities, lumbar region: Secondary | ICD-10-CM

## 2023-11-20 NOTE — Progress Notes (Signed)
 Referring Physician:  Gasper Nancyann BRAVO, MD [address] [place],  [place] [address]  Primary Physician:  Gasper Nancyann BRAVO, MD  History of Present Illness:  [name] is here today with a chief complaint of worsening lower back and right hip pain.  She has experienced lower back pain for approximately a year and a half, initially manageable with morning stiffness. Significant pain developed in her right hip earlier this year. Imaging, including x-rays and CT scans, revealed arthritic changes and slippage in her spine. Physical therapy was attempted but was ineffective, leading to her discharge from the program.  She describes the pain as a burning and aching sensation in her legs, worsening throughout the day, particularly with prolonged standing or activity. The left leg is more affected by sciatica than the right.  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  She has a history of autoimmune issues, which she believes may be linked to Essure tubal ligation implants received in 2008.  11/20/23 Patient comes in today for worsening pain.  She states that by the end of her day she can hardly move at all.  This is affected her activities of daily living including showering, brushing her teeth and brushing her hair due to the significance of her pain.  She has stopped smoking in order to have surgery done as soon as possible.  She continues to use tizanidine as well as gabapentin  for her pain.    Heidi [name] has no symptoms of cervical myelopathy.  The symptoms are causing a significant impact on the patient's life.   I have utilized the care everywhere function in epic to review the outside records available from external health systems.   09/18/2023 Note from [place], [place] [name] is here today with a chief complaint of low back pain that radiates down bilateral posterior legs to the area  right above her ankles.  It alternates which leg is worse than the other.  The shooting pain down her legs is intermittent and worse in the mornings and at night.  She denies any numbness, but states that she has tingling down the back of her legs.  She denies weakness, but has been having some calf pain and cramping.  She did have some relief with injections previously.  No saddle anesthesia or issues with incontinence.  She does have some temporary relief when leaning forward.     Duration: 1 year, pain in legs started about 7 months ago  Severity: 2/10 to 10/10  Precipitating: aggravated by any movement or daily activities  Modifying factors: made better by rest Weakness: none Timing: constant Bowel/Bladder Dysfunction: none   Conservative measures:  Physical therapy: did some home exercises, without relief Multimodal medical therapy including regular antiinflammatories: Gabapentin , Diclofenac  Injections: 08/25/2023: Bilateral S1 transforaminal ESI (no relief, made the pain worse, burning in calves) 05/05/2023: Bilateral S1 transforaminal ESI (50% relief) 03/31/2023: Bilateral S1 transforaminal ESI (70% relief)    Past Surgery: no spinal surgeries   Review of Systems:  A 10 point review of systems is negative, except for the pertinent positives and negatives detailed in the HPI.  Past Medical History: Past Medical History:  Diagnosis Date   Anxiety    Asthma    Body piercing    Tragus piercing.  Pt reports CANNOT be removed   Clotting disorder    DVT   Depression    DVT (deep venous thrombosis) (HCC) 2004  Family history of colon cancer in mother    Family history of ovarian cancer    4/21 cancer genetic tesitng letter sent   Herpes genitalis    Lupus    MVP (mitral valve prolapse)    Dx'd many years ago. No cardiologist.   Neck stiffness    bulging discs   Restless leg syndrome    Wears contact lenses     Past Surgical History: Past Surgical History:   Procedure Laterality Date   ABLATION     COLONOSCOPY  2011   COLONOSCOPY WITH PROPOFOL  N/A 03/24/2023   Procedure: COLONOSCOPY WITH PROPOFOL ;  Surgeon: Unk Corinn Skiff, MD;  Location: [facility];  Service: Endoscopy;  Laterality: N/A;   ENDOMETRIAL BIOPSY     ESOPHAGOGASTRODUODENOSCOPY (EGD) WITH PROPOFOL  N/A 03/10/2021   Procedure: ESOPHAGOGASTRODUODENOSCOPY (EGD) WITH PROPOFOL ;  Surgeon: Unk Corinn Skiff, MD;  Location: [facility];  Service: Gastroenterology;  Laterality: N/A;   NASAL SEPTUM SURGERY     NOSE SURGERY  1995   deviated nose septum   POLYPECTOMY  03/24/2023   Procedure: POLYPECTOMY;  Surgeon: Unk Corinn Skiff, MD;  Location: [facility];  Service: Endoscopy;;   TONSILLECTOMY  1995   TUBAL LIGATION  2009    Allergies: Allergies as of 11/20/2023 - Review Complete 11/20/2023  Allergen Reaction Noted   Antihistamines, chlorpheniramine-type  03/15/2023   Sleep aid [diphenhydramine hcl]  03/15/2023   Zyprexa [olanzapine]  04/03/2020    Medications:  Current Outpatient Medications:    cyanocobalamin (VITAMIN B12) 1000 MCG tablet, Take 1,000 mcg by mouth in the morning and at bedtime., Disp: , Rfl:    diclofenac  (VOLTAREN ) 75 MG EC tablet, TAKE 1 TABLET BY MOUTH TWICE A DAY AS NEEDED WITH FOOD, Disp: 180 tablet, Rfl: 0   gabapentin  (NEURONTIN ) 300 MG capsule, Take 300mg  in the morning and 600mg  at night, Disp: 90 capsule, Rfl: 3   lamoTRIgine (LAMICTAL) 200 MG tablet, Take by mouth every morning., Disp: , Rfl:    sertraline (ZOLOFT) 50 MG tablet, Take 50 mg by mouth daily., Disp: , Rfl:    tiZANidine (ZANAFLEX) 4 MG tablet, Take 0.5-1 tablets (2-4 mg total) by mouth every 8 (eight) hours as needed., Disp: 90 tablet, Rfl: 1   valACYclovir  (VALTREX ) 1000 MG tablet, TAKE 1 TABLET BY MOUTH EVERY DAY, Disp: 90 tablet, Rfl: 1  Social History: Social History   Tobacco Use   Smoking status: Former    Current packs/day: 0.25    Average packs/day:  0.3 packs/day for 11.9 years (3.2 ttl pk-yrs)    Types: Cigarettes    Start date: 11/29/1992    Quit date: 11/29/1993   Smokeless tobacco: Never   Tobacco comments:    QUIT 10/25/23  Vaping Use   Vaping status: Never Used  Substance Use Topics   Alcohol use: Yes    Comment: occasional   Drug use: No    Family Medical History: Family History  Problem Relation Age of Onset   Cancer Mother 41       colon, rectal   Diabetes Father    Bladder Cancer Maternal Grandmother    Bladder Cancer Maternal Grandfather    Uterine cancer Maternal Aunt     Physical Examination: Vitals:   11/20/23 0959  BP: 128/78    General: Patient is in no apparent distress. Attention to examination is appropriate.  Neck:   Supple.  Full range of motion.  Respiratory: Patient is breathing without any difficulty.   NEUROLOGICAL:  Awake, alert, oriented to person, place, and time.  Speech is clear and fluent.   Cranial Nerves: Pupils equal round and reactive to light.  Facial tone is symmetric.  Facial sensation is symmetric. Shoulder shrug is symmetric. Tongue protrusion is midline.  There is no pronator drift.  Strength: Side Biceps Triceps Deltoid Interossei Grip Wrist Ext. Wrist Flex.  R 5 5 5 5 5 5 5   L 5 5 5 5 5 5 5    Side Iliopsoas Quads Hamstring PF DF EHL  R 5 5 5 5 5 5   L 5 5 5 5 5 5    Reflexes are 1+ and symmetric at the biceps, triceps, brachioradialis, patella and achilles.   Hoffman's is absent.   Bilateral upper and lower extremity sensation is intact to light touch.    No evidence of dysmetria noted.  Gait is normal.     Medical Decision Making  Imaging: MR L spine 03/17/2023 IMPRESSION: 1. Moderate subarticular stenosis bilaterally at L4-5, right greater than left. 2. Mild foraminal narrowing bilaterally at L4-5. 3. Moderate right subarticular stenosis at L5-S1 secondary to a right synovial cyst. 4. Mild left subarticular stenosis and mild right  foraminal narrowing at L5-S1. 5. Mild facet hypertrophy bilaterally at L3-4 without stenosis.     Electronically Signed   By: Lonni Necessary M.D.   On: 03/17/2023 14:46   X-rays on September 18, 2023 shows substantial L4 and L5 anterolisthesis.  She has approximately 5 mm of listhesis on extension that increases to 10 mm on flexion.  This is in comparison to her MRI scan, which has minimal anterolisthesis.  I have personally reviewed the images and agree with the above interpretation.  Assessment and Plan: Heidi [name] is a pleasant 48 y.o. female with spondylolisthesis of the lumbar spine worst at L4-5.  She also has significant degenerative disc disease at L5-S1.  She has stenosis at L4-5 secondary to this.  She has seen Dr. Katrina after failing physical therapy and is planning to undergo a L4-5 lateral lumbar interbody fusion with percutaneous fixation.  She has quit smoking.  She is due to have a nicotine test this week.  Once this is complete, we will plan on scheduling her surgery.  She is eager to have this done as soon as possible.  We did discuss other pain modalities in the meantime, but patient would like to hold off at this time.    Thank you for involving me in the care of this patient.     Lyle Decamp, PA-C Neurosurgery

## 2023-11-21 ENCOUNTER — Other Ambulatory Visit: Payer: Self-pay | Admitting: Physician Assistant

## 2023-11-21 MED ORDER — TRAMADOL HCL 50 MG PO TABS: 50.0000 mg | ORAL_TABLET | Freq: Three times a day (TID) | ORAL | 0 refills | Status: DC | PRN

## 2023-11-22 ENCOUNTER — Other Ambulatory Visit: Payer: Self-pay | Admitting: Physician Assistant

## 2023-11-22 MED ORDER — OXYCODONE HCL 5 MG PO TABS: 2.5000 mg | ORAL_TABLET | Freq: Four times a day (QID) | ORAL | 0 refills | Status: DC | PRN

## 2023-11-24 ENCOUNTER — Ambulatory Visit: Payer: Self-pay

## 2023-11-24 LAB — NICOTINE/COTININE METABOLITES
Cotinine: <1 ng/mL
Nicotine: <1 ng/mL

## 2023-11-30 ENCOUNTER — Ambulatory Visit: Admitting: Neurosurgery

## 2023-11-30 ENCOUNTER — Encounter: Payer: Self-pay | Admitting: Neurosurgery

## 2023-11-30 ENCOUNTER — Other Ambulatory Visit: Payer: Self-pay

## 2023-11-30 VITALS — BP 114/78 | Ht 68.0 in | Wt 177.0 lb

## 2023-11-30 DIAGNOSIS — M51372 Other intervertebral disc degeneration, lumbosacral region with discogenic back pain and lower extremity pain: Secondary | ICD-10-CM | POA: Diagnosis not present

## 2023-11-30 DIAGNOSIS — M532X6 Spinal instabilities, lumbar region: Secondary | ICD-10-CM

## 2023-11-30 DIAGNOSIS — M7138 Other bursal cyst, other site: Secondary | ICD-10-CM | POA: Diagnosis not present

## 2023-11-30 DIAGNOSIS — G8929 Other chronic pain: Secondary | ICD-10-CM

## 2023-11-30 DIAGNOSIS — M48061 Spinal stenosis, lumbar region without neurogenic claudication: Secondary | ICD-10-CM | POA: Diagnosis not present

## 2023-11-30 DIAGNOSIS — M4316 Spondylolisthesis, lumbar region: Secondary | ICD-10-CM

## 2023-11-30 DIAGNOSIS — Z01818 Encounter for other preprocedural examination: Secondary | ICD-10-CM

## 2023-11-30 NOTE — Progress Notes (Signed)
 Referring Physician:  Gasper Nancyann BRAVO, MD [address] [place],  [place] [address]  Primary Physician:  Gasper Nancyann BRAVO, MD  History of Present Illness: 11/30/2023 Ms. Cude presents today to discuss her pain.  She has quit smoking.  10/24/2023 Ms. Lillan Mccreadie is here today with a chief complaint of worsening lower back and right hip pain.  She has experienced lower back pain for approximately a year and a half, initially manageable with morning stiffness. Significant pain developed in her right hip earlier this year. Imaging, including x-rays and CT scans, revealed arthritic changes and slippage in her spine. Physical therapy was attempted but was ineffective, leading to her discharge from the program.  She describes the pain as a burning and aching sensation in her legs, worsening throughout the day, particularly with prolonged standing or activity. The left leg is more affected by sciatica than the right.  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  She has a history of autoimmune issues, which she believes may be linked to Essure tubal ligation implants received in 2008.   Samaiyah Howes has no symptoms of cervical myelopathy.  The symptoms are causing a significant impact on the patient's life.   I have utilized the care everywhere function in epic to review the outside records available from external health systems.   09/18/2023 Note from [place], [place] Ms. Treasa Bradshaw is here today with a chief complaint of low back pain that radiates down bilateral posterior legs to the area right above her ankles.  It alternates which leg is worse than the other.  The shooting pain down her legs is intermittent and worse in the mornings and at night.  She denies any numbness, but states that she has tingling down the back of her legs.  She denies weakness, but has been having some calf pain and cramping.  She  did have some relief with injections previously.  No saddle anesthesia or issues with incontinence.  She does have some temporary relief when leaning forward.     Duration: 1 year, pain in legs started about 7 months ago  Severity: 2/10 to 10/10  Precipitating: aggravated by any movement or daily activities  Modifying factors: made better by rest Weakness: none Timing: constant Bowel/Bladder Dysfunction: none   Conservative measures:  Physical therapy: did some home exercises, without relief Multimodal medical therapy including regular antiinflammatories: Gabapentin , Diclofenac  Injections: 08/25/2023: Bilateral S1 transforaminal ESI (no relief, made the pain worse, burning in calves) 05/05/2023: Bilateral S1 transforaminal ESI (50% relief) 03/31/2023: Bilateral S1 transforaminal ESI (70% relief)    Past Surgery: no spinal surgeries   Review of Systems:  A 10 point review of systems is negative, except for the pertinent positives and negatives detailed in the HPI.  Past Medical History: Past Medical History:  Diagnosis Date   Anxiety    Asthma    Body piercing    Tragus piercing.  Pt reports CANNOT be removed   Clotting disorder    DVT   Depression    DVT (deep venous thrombosis) (HCC) 2004   Family history of colon cancer in mother    Family history of ovarian cancer    4/21 cancer genetic tesitng letter sent   Herpes genitalis    Lupus    MVP (mitral valve prolapse)    Dx'd many years ago. No cardiologist.   Neck stiffness    bulging discs   Restless leg syndrome  Wears contact lenses     Past Surgical History: Past Surgical History:  Procedure Laterality Date   ABLATION     COLONOSCOPY  2011   COLONOSCOPY WITH PROPOFOL  N/A 03/24/2023   Procedure: COLONOSCOPY WITH PROPOFOL ;  Surgeon: Unk Corinn Skiff, MD;  Location: [facility];  Service: Endoscopy;  Laterality: N/A;   ENDOMETRIAL BIOPSY     ESOPHAGOGASTRODUODENOSCOPY (EGD) WITH PROPOFOL  N/A  03/10/2021   Procedure: ESOPHAGOGASTRODUODENOSCOPY (EGD) WITH PROPOFOL ;  Surgeon: Unk Corinn Skiff, MD;  Location: [facility];  Service: Gastroenterology;  Laterality: N/A;   NASAL SEPTUM SURGERY     NOSE SURGERY  1995   deviated nose septum   POLYPECTOMY  03/24/2023   Procedure: POLYPECTOMY;  Surgeon: Unk Corinn Skiff, MD;  Location: [facility];  Service: Endoscopy;;   TONSILLECTOMY  1995   TUBAL LIGATION  2009    Allergies: Allergies as of 11/30/2023 - Review Complete 11/30/2023  Allergen Reaction Noted   Antihistamines, chlorpheniramine-type  03/15/2023   Sleep aid [diphenhydramine hcl]  03/15/2023   Zyprexa [olanzapine]  04/03/2020    Medications:  Current Outpatient Medications:    cyanocobalamin (VITAMIN B12) 1000 MCG tablet, Take 1,000 mcg by mouth in the morning and at bedtime., Disp: , Rfl:    diclofenac  (VOLTAREN ) 75 MG EC tablet, TAKE 1 TABLET BY MOUTH TWICE A DAY AS NEEDED WITH FOOD, Disp: 180 tablet, Rfl: 0   gabapentin  (NEURONTIN ) 300 MG capsule, Take 300mg  in the morning and 600mg  at night, Disp: 90 capsule, Rfl: 3   lamoTRIgine (LAMICTAL) 200 MG tablet, Take by mouth every morning., Disp: , Rfl:    oxyCODONE (ROXICODONE) 5 MG immediate release tablet, Take 0.5-1 tablets (2.5-5 mg total) by mouth every 6 (six) hours as needed for severe pain (pain score 7-10)., Disp: 30 tablet, Rfl: 0   sertraline (ZOLOFT) 50 MG tablet, Take 50 mg by mouth daily., Disp: , Rfl:    tiZANidine (ZANAFLEX) 4 MG tablet, Take 0.5-1 tablets (2-4 mg total) by mouth every 8 (eight) hours as needed., Disp: 90 tablet, Rfl: 1   valACYclovir  (VALTREX ) 1000 MG tablet, TAKE 1 TABLET BY MOUTH EVERY DAY, Disp: 90 tablet, Rfl: 1  Social History: Social History   Tobacco Use   Smoking status: Former    Current packs/day: 0.25    Average packs/day: 0.3 packs/day for 11.9 years (3.2 ttl pk-yrs)    Types: Cigarettes    Start date: 11/29/1992    Quit date: 11/29/1993   Smokeless  tobacco: Never   Tobacco comments:    QUIT 10/25/23  Vaping Use   Vaping status: Never Used  Substance Use Topics   Alcohol use: Yes    Comment: occasional   Drug use: No    Family Medical History: Family History  Problem Relation Age of Onset   Cancer Mother 55       colon, rectal   Diabetes Father    Bladder Cancer Maternal Grandmother    Bladder Cancer Maternal Grandfather    Uterine cancer Maternal Aunt     Physical Examination: Vitals:   11/30/23 0953  BP: 114/78    General: Patient is in no apparent distress. Attention to examination is appropriate.  Neck:   Supple.  Full range of motion.  Respiratory: Patient is breathing without any difficulty.   NEUROLOGICAL:     Awake, alert, oriented to person, place, and time.  Speech is clear and fluent.   Cranial Nerves: Pupils equal round and reactive to light.  Facial tone  is symmetric.  Facial sensation is symmetric. Shoulder shrug is symmetric. Tongue protrusion is midline.  There is no pronator drift.  Strength: Side Biceps Triceps Deltoid Interossei Grip Wrist Ext. Wrist Flex.  R 5 5 5 5 5 5 5   L 5 5 5 5 5 5 5    Side Iliopsoas Quads Hamstring PF DF EHL  R 5 5 5 5 5 5   L 5 5 5 5 5 5    Reflexes are 1+ and symmetric at the biceps, triceps, brachioradialis, patella and achilles.   Hoffman's is absent.   Bilateral upper and lower extremity sensation is intact to light touch.    No evidence of dysmetria noted.  Gait is normal.     Medical Decision Making  Imaging: MR L spine 03/17/2023 IMPRESSION: 1. Moderate subarticular stenosis bilaterally at L4-5, right greater than left. 2. Mild foraminal narrowing bilaterally at L4-5. 3. Moderate right subarticular stenosis at L5-S1 secondary to a right synovial cyst. 4. Mild left subarticular stenosis and mild right foraminal narrowing at L5-S1. 5. Mild facet hypertrophy bilaterally at L3-4 without stenosis.     Electronically Signed   By: Lonni Necessary M.D.   On: 03/17/2023 14:46   X-rays on September 18, 2023 shows substantial L4 and L5 anterolisthesis.  She has approximately 5 mm of listhesis on extension that increases to 10 mm on flexion.  This is in comparison to her MRI scan, which has minimal anterolisthesis.  In addition to above, she also has a 5 degree change in angulation of her L5-S1 to space with significant opening of the anterior portion of the displaced.  She has severe degenerative disease at L5-S1.  I have personally reviewed the images and agree with the above interpretation.  Assessment and Plan: Ms. Digioia is a pleasant 48 y.o. female with spondylolisthesis of the lumbar spine worst at L4-5.  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  She has stenosis at L4-5 secondary to this.  On her MRI scan, she has minimal anterolisthesis.  This increases to approximately 10 mm on the flexion component of her standing x-rays.  There is clear evidence of lumbar instability.  She has failed physical therapy.  I think she is an appropriate candidate to consider L4-S1transforaminal lumbar interbody fusion.  I discussed the planned procedure at length with the patient, including the risks, benefits, alternatives, and indications. The risks discussed include but are not limited to bleeding, infection, need for reoperation, spinal fluid leak, stroke, vision loss, anesthetic complication, coma, paralysis, and even death. I also described in detail that improvement was not guaranteed.  The patient expressed understanding of these risks, and asked that we proceed with surgery. I described the surgery in layman's terms, and gave ample opportunity for questions, which were answered to the best of my ability.   I spent a total of 30 minutes in this patient's care today. This time was spent reviewing pertinent records including imaging studies, obtaining and  confirming history, performing a directed evaluation, formulating and discussing my recommendations, and documenting the visit within the medical record.     Thank you for involving me in the care of this patient.      Amar Keenum K. Clois MD, [facility] Neurosurgery

## 2023-11-30 NOTE — Patient Instructions (Signed)
 Please see below for information in regards to your upcoming surgery:   Planned surgery: L4-S1 Transforaminal Lumbar Interbody Fusion, Minimally Invasive (MIS)   Surgery date: 12/25/23 at [facility] (Medical Mall: [address], [place], [place] [address]) - you will find out your arrival time the business day before your surgery.   Pre-op appointment at [facility] Pre-admit Testing: you will receive a call with a date/time for this appointment. If you are scheduled for an in person appointment, Pre-admit Testing is located on the first floor of the [facility] building, [address], [address]. During this appointment, they will advise you which medications you can take the morning of surgery, and which medications you will need to hold for surgery. Labs (such as blood work, EKG) may be done at your pre-op appointment. You are not required to fast for these labs. Should you need to change your pre-op appointment, please call Pre-admit testing at [phone].      Surgical clearance: we will send a clearance form to Nancyann Perry, MD. They may wish to see you in their office prior to signing the clearance form. If so, they may call you to schedule an appointment.      NSAIDS (Non-steroidal anti-inflammatory drugs): because you are having a fusion, please avoid taking any NSAIDS (examples: ibuprofen, motrin, aleve , naproxen , meloxicam, diclofenac ) for 3 months after surgery. Celebrex is an exception and is OK to take, if prescribed. Tylenol  is not an NSAID.    Common restrictions after spine surgery: No bending, lifting, or twisting ("BLT"). Avoid lifting objects heavier than 10 pounds for the first 6 weeks after surgery. Where possible, avoid household activities that involve lifting, bending, reaching, pushing, or pulling such as laundry, vacuuming, grocery shopping, and childcare. Try to arrange for help from friends and family for these  activities while you heal. Do not drive while taking prescription pain medication. Weeks 6 through 12 after surgery: avoid lifting more than 25 pounds.    X-rays after surgery: Because you are having a fusion or arthroplasty: for appointments after your 2 week follow-up: please arrive our office 30 minutes prior to your appointment for x-rays. This applies to every appointment after your 2 week follow-up. Failure to do so may result in your appointment being rescheduled.    How to contact us :  If you have any questions/concerns before or after surgery, you can reach us  at [phone], or you can send a mychart message. We can be reached by phone or mychart 8am-4pm, Monday-Friday.  *Please note: Calls after 4pm are forwarded to a third party answering service. Mychart messages are not routinely monitored during evenings, weekends, and holidays. Please call our office to contact the answering service for urgent concerns during non-business hours.     If you have FMLA/disability paperwork, please drop it off or fax it to [phone]   Appointments/FMLA & disability paperwork: Reche Hait, & Nichole Registered Nurse/Surgery scheduler: Kendelyn, RN & Katie, RN Certified Medical Assistants: Don, CMA, Elenor, CMA, Damien, CMA, & Auston, [place] Physician Assistants: Lyle Decamp, PA-C, Edsel Goods, PA-C & Glade Boys, PA-C Surgeons: Penne Sharps, MD & Reeves Daisy, MD    [facility] PREADMIT TESTING VISIT and SURGERY INFORMATION SHEET   Now that surgery has been scheduled you can anticipate several phone calls from [facility] services. A pharmacy technician will call you to verify your current list of medications taken at home.  The Pre-Service Center will call to verify your insurance information and to give you billing estimates and information.             The Preadmit Testing Office will be calling to schedule a visit to obtain information  for the anesthesia team and provide instructions on preparation for surgery.  What can you expect for the Preadmit Testing Visit: Appointments may be scheduled in-person or by telephone.  If a telephone visit is scheduled, you may be asked to come into the office to have lab tests or other studies performed.   This visit will not be completed any greater than 14 days prior to your surgery.  If your surgery has been scheduled for a future date, please do not be alarmed if we have not contacted you to schedule an appointment more than a month prior to the surgery date.    Please be prepared to provide the following information during this appointment:            -Personal medical history                                               -Medication and allergy  list            -Any history of problems with anesthesia              -Recent lab work or diagnostic studies            -Please notify us  of any needs we should be aware of to provide the best care possible           -You will be provided with instructions on how to prepare for your surgery.    On The Day of Surgery:  You must have a driver to take you home after surgery, you will be asked not to drive for 24 hours following surgery.  Taxi, Gisele and non-medical transport will not be acceptable means of transportation unless you have a responsible individual who will be traveling with you.  Visitors in the surgical area:   2 people will be able to visit you in your room once your preparation for surgery has been completed. During surgery, your visitors will be asked to wait in the Surgery Waiting Area.  It is not a requirement for them to stay, if they prefer to leave and come back.  Your visitor(s) will be given an update once the surgery has been completed.  No visitors are allowed in the initial recovery room to respect patient privacy and safety.  Once you are more awake and transfer to the secondary recovery area, or are transferred to an  inpatient room, visitors will again be able to see you.  To respect and protect your privacy: We will ask on the day of surgery who your driver will be and what the contact number for that individual will be. We will ask if it is okay to share information with this individual, or if there is an alternative individual that we, or the surgeon, should contact to provide updates and information. If family or friends come to the surgical information desk requesting information about you, who you have not listed with us , no information will be given.   It may be helpful to designate someone as the main contact who will be responsible for updating your other friends  and family.    PREADMIT TESTING OFFICE: [phone] SAME DAY SURGERY: [phone] We look forward to caring for you before and throughout the process of your surgery.

## 2023-11-30 NOTE — H&P (View-Only) (Signed)
 Referring Physician:  Gasper Nancyann BRAVO, MD [address] [place],  [place] [address]  Primary Physician:  Gasper Nancyann BRAVO, MD  History of Present Illness: 11/30/2023 Heidi [name] presents today to discuss her pain.  She has quit smoking.  10/24/2023 Heidi [name] is here today with a chief complaint of worsening lower back and right hip pain.  She has experienced lower back pain for approximately a year and a half, initially manageable with morning stiffness. Significant pain developed in her right hip earlier this year. Imaging, including x-rays and CT scans, revealed arthritic changes and slippage in her spine. Physical therapy was attempted but was ineffective, leading to her discharge from the program.  She describes the pain as a burning and aching sensation in her legs, worsening throughout the day, particularly with prolonged standing or activity. The left leg is more affected by sciatica than the right.  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  She has a history of autoimmune issues, which she believes may be linked to Essure tubal ligation implants received in 2008.   Heidi [name] has no symptoms of cervical myelopathy.  The symptoms are causing a significant impact on the patient's life.   I have utilized the care everywhere function in epic to review the outside records available from external health systems.   09/18/2023 Note from [place], [place] Heidi [name] is here today with a chief complaint of low back pain that radiates down bilateral posterior legs to the area right above her ankles.  It alternates which leg is worse than the other.  The shooting pain down her legs is intermittent and worse in the mornings and at night.  She denies any numbness, but states that she has tingling down the back of her legs.  She denies weakness, but has been having some calf pain and cramping.  She  did have some relief with injections previously.  No saddle anesthesia or issues with incontinence.  She does have some temporary relief when leaning forward.     Duration: 1 year, pain in legs started about 7 months ago  Severity: 2/10 to 10/10  Precipitating: aggravated by any movement or daily activities  Modifying factors: made better by rest Weakness: none Timing: constant Bowel/Bladder Dysfunction: none   Conservative measures:  Physical therapy: did some home exercises, without relief Multimodal medical therapy including regular antiinflammatories: Gabapentin , Diclofenac  Injections: 08/25/2023: Bilateral S1 transforaminal ESI (no relief, made the pain worse, burning in calves) 05/05/2023: Bilateral S1 transforaminal ESI (50% relief) 03/31/2023: Bilateral S1 transforaminal ESI (70% relief)    Past Surgery: no spinal surgeries   Review of Systems:  A 10 point review of systems is negative, except for the pertinent positives and negatives detailed in the HPI.  Past Medical History: Past Medical History:  Diagnosis Date   Anxiety    Asthma    Body piercing    Tragus piercing.  Pt reports CANNOT be removed   Clotting disorder    DVT   Depression    DVT (deep venous thrombosis) (HCC) 2004   Family history of colon cancer in mother    Family history of ovarian cancer    4/21 cancer genetic tesitng letter sent   Herpes genitalis    Lupus    MVP (mitral valve prolapse)    Dx'd many years ago. No cardiologist.   Neck stiffness    bulging discs   Restless leg syndrome  Wears contact lenses     Past Surgical History: Past Surgical History:  Procedure Laterality Date   ABLATION     COLONOSCOPY  2011   COLONOSCOPY WITH PROPOFOL  N/A 03/24/2023   Procedure: COLONOSCOPY WITH PROPOFOL ;  Surgeon: Unk Corinn Skiff, MD;  Location: [facility];  Service: Endoscopy;  Laterality: N/A;   ENDOMETRIAL BIOPSY     ESOPHAGOGASTRODUODENOSCOPY (EGD) WITH PROPOFOL  N/A  03/10/2021   Procedure: ESOPHAGOGASTRODUODENOSCOPY (EGD) WITH PROPOFOL ;  Surgeon: Unk Corinn Skiff, MD;  Location: [facility];  Service: Gastroenterology;  Laterality: N/A;   NASAL SEPTUM SURGERY     NOSE SURGERY  1995   deviated nose septum   POLYPECTOMY  03/24/2023   Procedure: POLYPECTOMY;  Surgeon: Unk Corinn Skiff, MD;  Location: [facility];  Service: Endoscopy;;   TONSILLECTOMY  1995   TUBAL LIGATION  2009    Allergies: Allergies as of 11/30/2023 - Review Complete 11/30/2023  Allergen Reaction Noted   Antihistamines, chlorpheniramine-type  03/15/2023   Sleep aid [diphenhydramine hcl]  03/15/2023   Zyprexa [olanzapine]  04/03/2020    Medications:  Current Outpatient Medications:    cyanocobalamin (VITAMIN B12) 1000 MCG tablet, Take 1,000 mcg by mouth in the morning and at bedtime., Disp: , Rfl:    diclofenac  (VOLTAREN ) 75 MG EC tablet, TAKE 1 TABLET BY MOUTH TWICE A DAY AS NEEDED WITH FOOD, Disp: 180 tablet, Rfl: 0   gabapentin  (NEURONTIN ) 300 MG capsule, Take 300mg  in the morning and 600mg  at night, Disp: 90 capsule, Rfl: 3   lamoTRIgine (LAMICTAL) 200 MG tablet, Take by mouth every morning., Disp: , Rfl:    oxyCODONE (ROXICODONE) 5 MG immediate release tablet, Take 0.5-1 tablets (2.5-5 mg total) by mouth every 6 (six) hours as needed for severe pain (pain score 7-10)., Disp: 30 tablet, Rfl: 0   sertraline (ZOLOFT) 50 MG tablet, Take 50 mg by mouth daily., Disp: , Rfl:    tiZANidine (ZANAFLEX) 4 MG tablet, Take 0.5-1 tablets (2-4 mg total) by mouth every 8 (eight) hours as needed., Disp: 90 tablet, Rfl: 1   valACYclovir  (VALTREX ) 1000 MG tablet, TAKE 1 TABLET BY MOUTH EVERY DAY, Disp: 90 tablet, Rfl: 1  Social History: Social History   Tobacco Use   Smoking status: Former    Current packs/day: 0.25    Average packs/day: 0.3 packs/day for 11.9 years (3.2 ttl pk-yrs)    Types: Cigarettes    Start date: 11/29/1992    Quit date: 11/29/1993   Smokeless  tobacco: Never   Tobacco comments:    QUIT 10/25/23  Vaping Use   Vaping status: Never Used  Substance Use Topics   Alcohol use: Yes    Comment: occasional   Drug use: No    Family Medical History: Family History  Problem Relation Age of Onset   Cancer Mother 55       colon, rectal   Diabetes Father    Bladder Cancer Maternal Grandmother    Bladder Cancer Maternal Grandfather    Uterine cancer Maternal Aunt     Physical Examination: Vitals:   11/30/23 0953  BP: 114/78    General: Patient is in no apparent distress. Attention to examination is appropriate.  Neck:   Supple.  Full range of motion.  Respiratory: Patient is breathing without any difficulty.   NEUROLOGICAL:     Awake, alert, oriented to person, place, and time.  Speech is clear and fluent.   Cranial Nerves: Pupils equal round and reactive to light.  Facial tone  is symmetric.  Facial sensation is symmetric. Shoulder shrug is symmetric. Tongue protrusion is midline.  There is no pronator drift.  Strength: Side Biceps Triceps Deltoid Interossei Grip Wrist Ext. Wrist Flex.  R 5 5 5 5 5 5 5   L 5 5 5 5 5 5 5    Side Iliopsoas Quads Hamstring PF DF EHL  R 5 5 5 5 5 5   L 5 5 5 5 5 5    Reflexes are 1+ and symmetric at the biceps, triceps, brachioradialis, patella and achilles.   Hoffman's is absent.   Bilateral upper and lower extremity sensation is intact to light touch.    No evidence of dysmetria noted.  Gait is normal.     Medical Decision Making  Imaging: MR L spine 03/17/2023 IMPRESSION: 1. Moderate subarticular stenosis bilaterally at L4-5, right greater than left. 2. Mild foraminal narrowing bilaterally at L4-5. 3. Moderate right subarticular stenosis at L5-S1 secondary to a right synovial cyst. 4. Mild left subarticular stenosis and mild right foraminal narrowing at L5-S1. 5. Mild facet hypertrophy bilaterally at L3-4 without stenosis.     Electronically Signed   By: Lonni Necessary M.D.   On: 03/17/2023 14:46   X-rays on September 18, 2023 shows substantial L4 and L5 anterolisthesis.  She has approximately 5 mm of listhesis on extension that increases to 10 mm on flexion.  This is in comparison to her MRI scan, which has minimal anterolisthesis.  In addition to above, she also has a 5 degree change in angulation of her L5-S1 to space with significant opening of the anterior portion of the displaced.  She has severe degenerative disease at L5-S1.  I have personally reviewed the images and agree with the above interpretation.  Assessment and Plan: Heidi [name] is a pleasant 48 y.o. female with spondylolisthesis of the lumbar spine worst at L4-5.  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  She has stenosis at L4-5 secondary to this.  On her MRI scan, she has minimal anterolisthesis.  This increases to approximately 10 mm on the flexion component of her standing x-rays.  There is clear evidence of lumbar instability.  She has failed physical therapy.  I think she is an appropriate candidate to consider L4-S1transforaminal lumbar interbody fusion.  I discussed the planned procedure at length with the patient, including the risks, benefits, alternatives, and indications. The risks discussed include but are not limited to bleeding, infection, need for reoperation, spinal fluid leak, stroke, vision loss, anesthetic complication, coma, paralysis, and even death. I also described in detail that improvement was not guaranteed.  The patient expressed understanding of these risks, and asked that we proceed with surgery. I described the surgery in layman's terms, and gave ample opportunity for questions, which were answered to the best of my ability.   I spent a total of 30 minutes in this patient's care today. This time was spent reviewing pertinent records including imaging studies, obtaining and  confirming history, performing a directed evaluation, formulating and discussing my recommendations, and documenting the visit within the medical record.     Thank you for involving me in the care of this patient.      Amar Keenum K. Clois MD, [facility] Neurosurgery

## 2023-12-01 ENCOUNTER — Encounter: Payer: Self-pay | Admitting: Neurosurgery

## 2023-12-01 NOTE — Telephone Encounter (Signed)
 Received request from Dr. Katrina for pre-op evaluation. Please advise and schedule appointment. Needs to be seen at least one week before surgery.

## 2023-12-06 ENCOUNTER — Other Ambulatory Visit: Payer: Self-pay | Admitting: Physician Assistant

## 2023-12-06 NOTE — Telephone Encounter (Signed)
 Pharmacy asking for RX refill as 90 day supply instead of 30

## 2023-12-11 IMAGING — US US ABDOMEN LIMITED
1 series · 14 of 25 positions shown · non-contrast
Comparison: CT abdomen pelvis 06/04/2020

CLINICAL DATA: Epigastric pain

EXAM:
ULTRASOUND ABDOMEN LIMITED RIGHT UPPER QUADRANT

[Series 1: us abdomen limited ruq (liver/gb) · 14 of 55 slices shown]
[im 1/55]
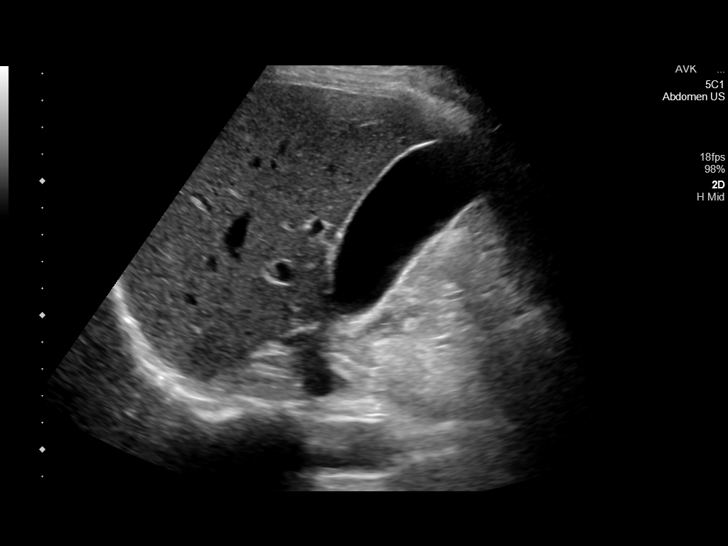
[im 5/55]
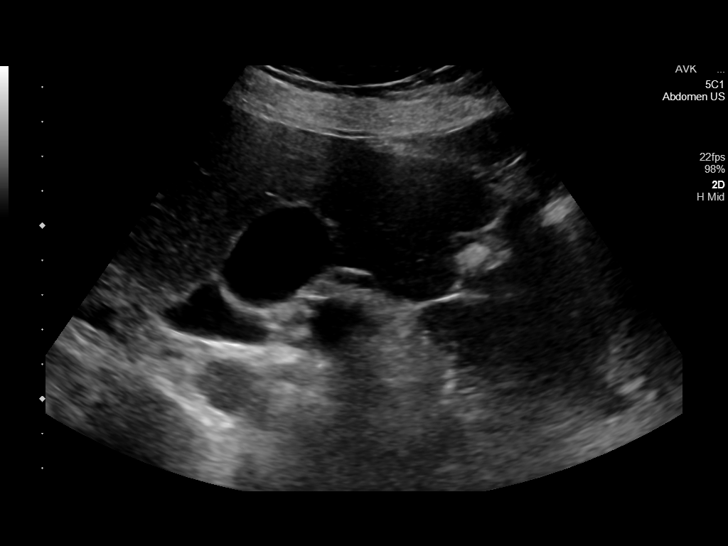
[im 10/55]
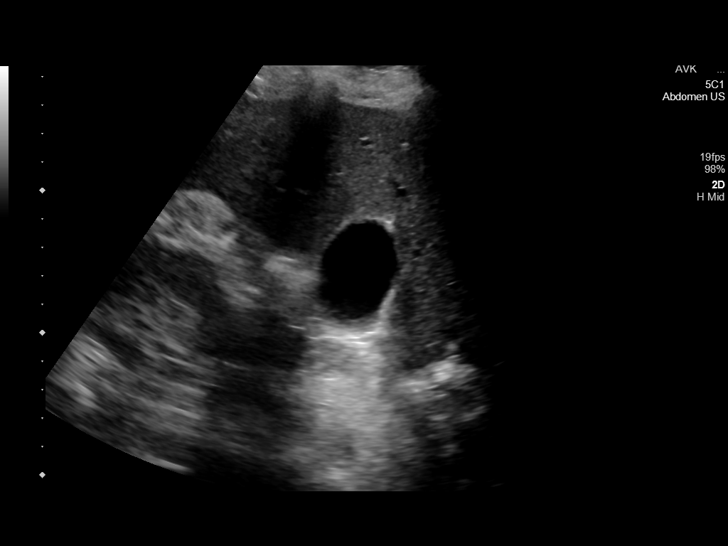
[im 14/55]
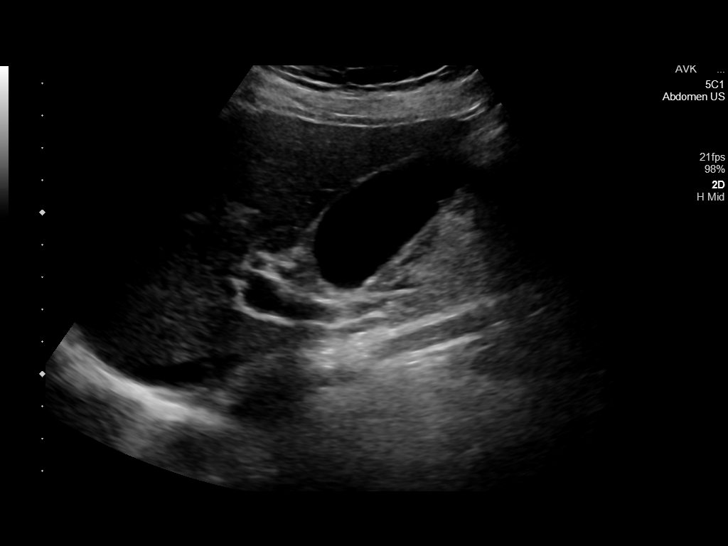
[im 19/55]
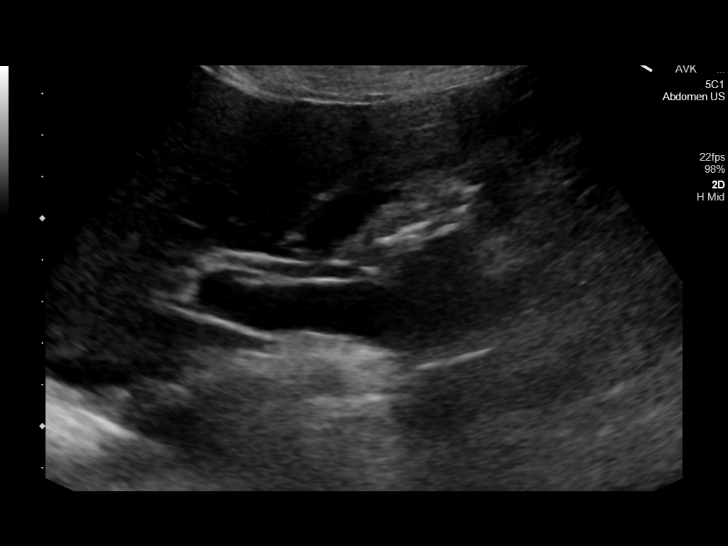
[im 21/55]
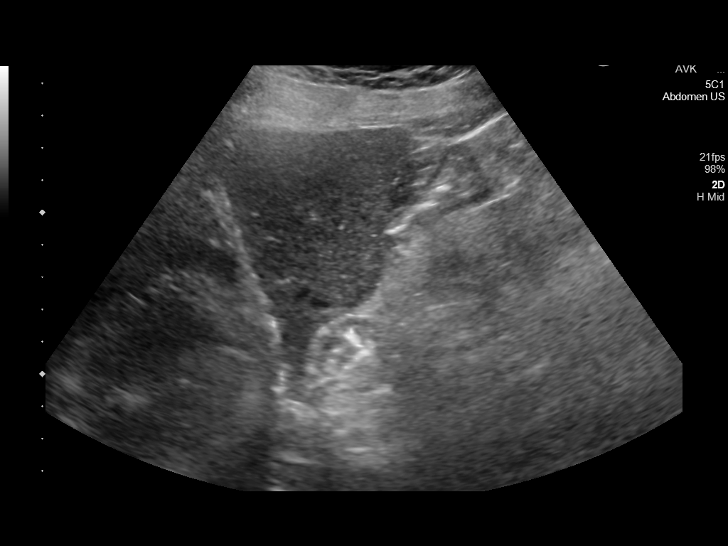
[im 25/55]
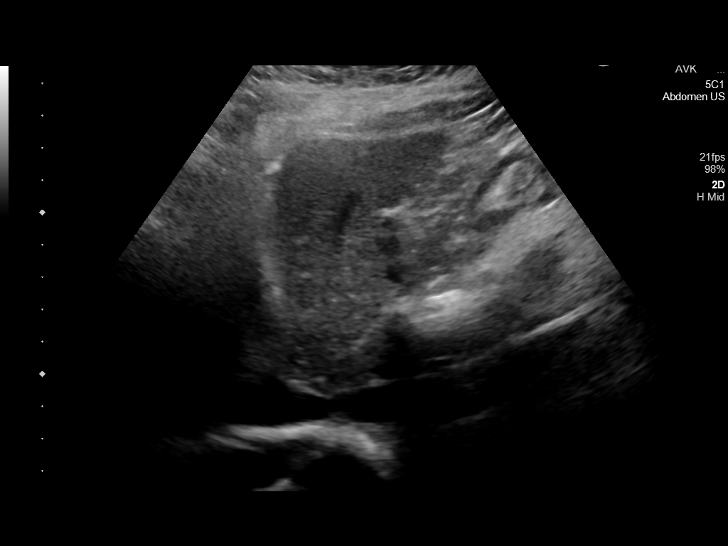
[im 30/55]
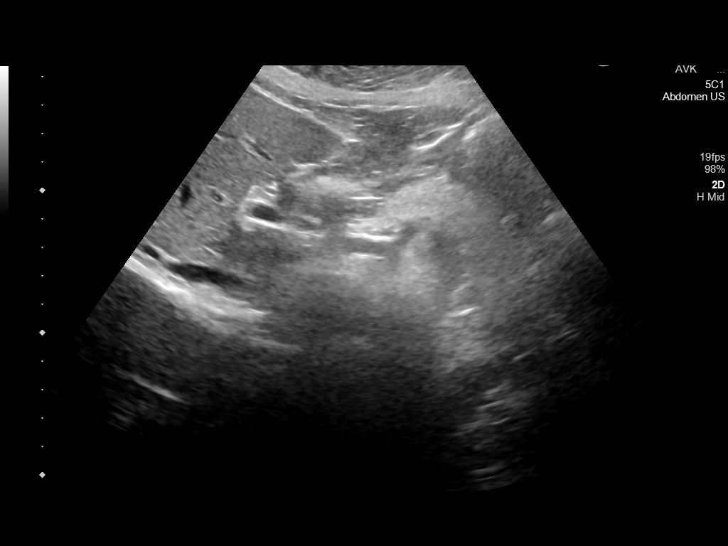
[im 34/55]
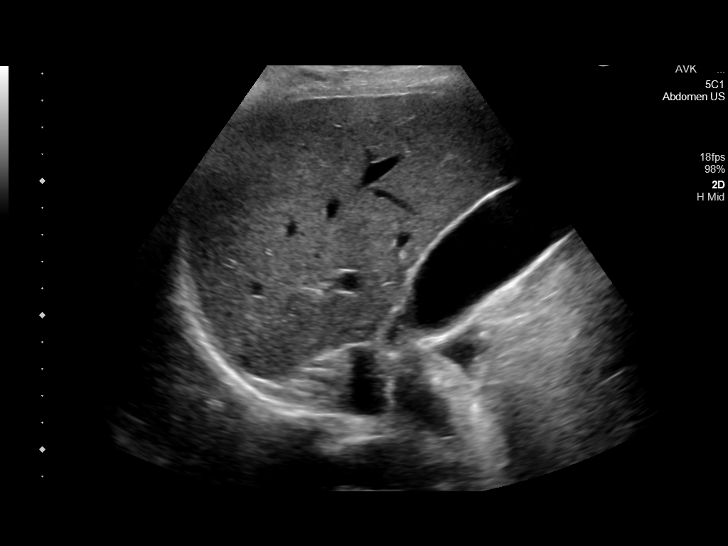
[im 37/55]
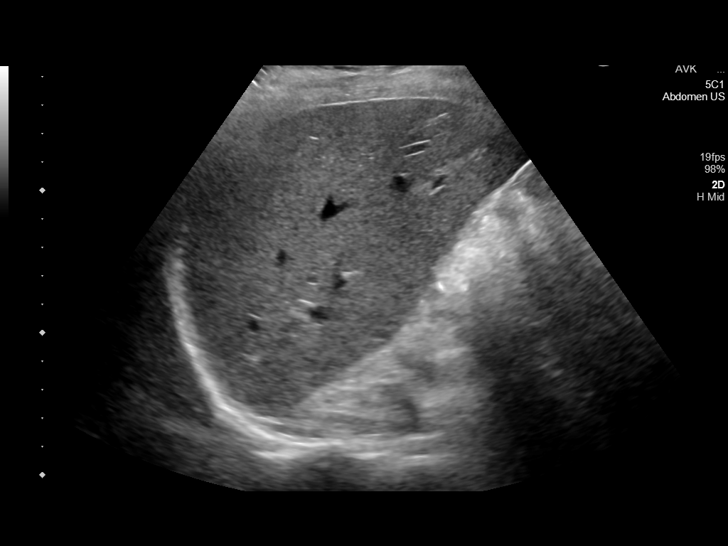
[im 41/55]
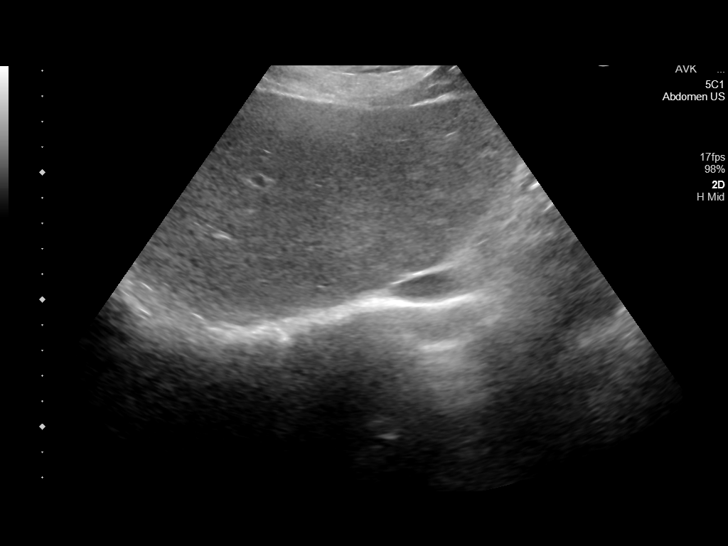
[im 46/55]
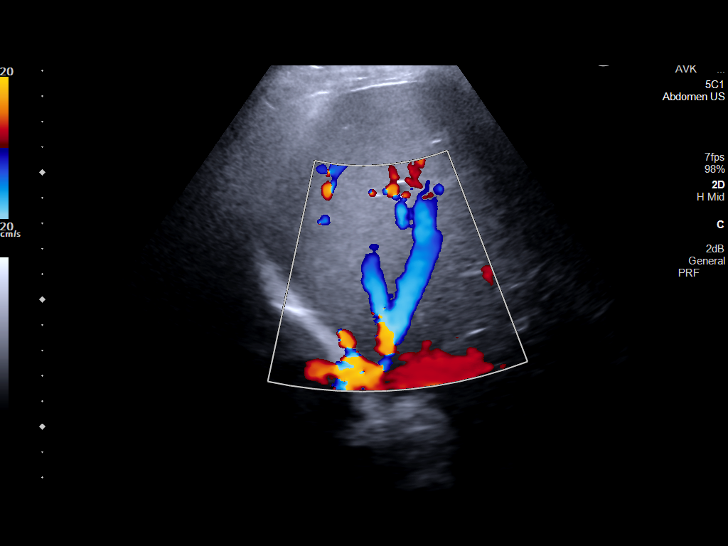
[im 50/55]
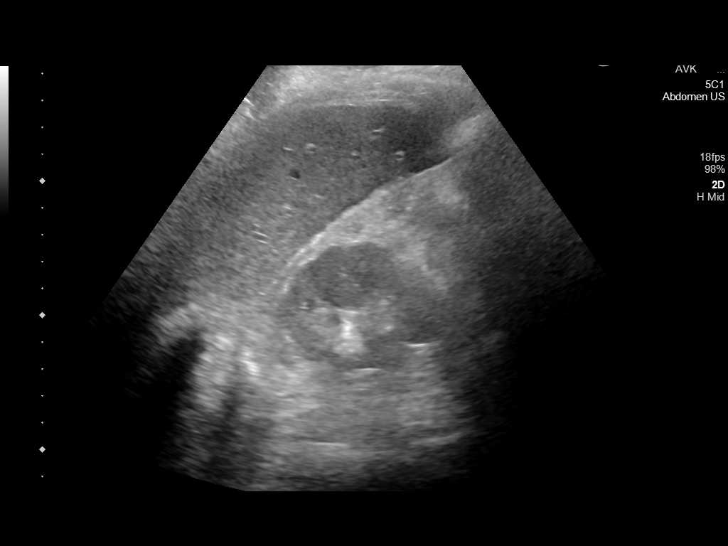
[im 55/55]
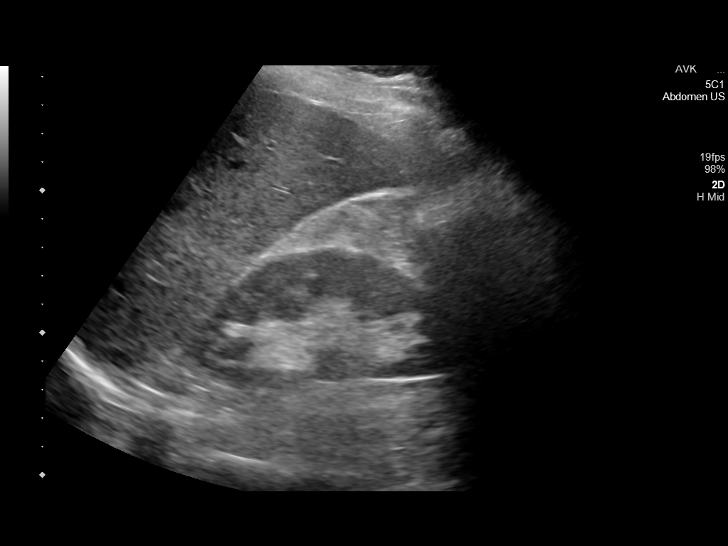

[14 of 25 positions shown; findings below may reference images not displayed]

FINDINGS: Gallbladder:

Gallstone: None

Sludge: None

Gallbladder Wall: Normal

Pericholecystic fluid: None

Sonographic Murphy's Sign: Negative per technologist

Common bile duct:

Diameter: 3 mm mm

Liver:

Parenchymal echogenicity: Normal

Lesion: None

Portal vein: Patent.  Hepatopetal flow

Other: None.
IMPRESSION: No significant sonographic abnormality of the liver or gallbladder.

## 2023-12-13 ENCOUNTER — Other Ambulatory Visit: Payer: Self-pay | Admitting: Physician Assistant

## 2023-12-13 MED ORDER — OXYCODONE HCL 5 MG PO TABS: 2.5000 mg | ORAL_TABLET | Freq: Four times a day (QID) | ORAL | 0 refills | Status: DC | PRN

## 2023-12-18 ENCOUNTER — Ambulatory Visit: Admitting: Family Medicine

## 2023-12-18 ENCOUNTER — Inpatient Hospital Stay
Admission: RE | Admit: 2023-12-18 | Discharge: 2023-12-18 | Disposition: A | Source: Ambulatory Visit | Attending: Neurosurgery | Admitting: Neurosurgery

## 2023-12-18 ENCOUNTER — Other Ambulatory Visit: Payer: Self-pay

## 2023-12-18 ENCOUNTER — Encounter: Payer: Self-pay | Admitting: Family Medicine

## 2023-12-18 VITALS — BP 125/74 | HR 61 | Temp 98.0°F | Resp 16 | Ht 68.0 in | Wt 186.2 lb

## 2023-12-18 VITALS — BP 125/61 | HR 64 | Resp 16 | Ht 68.0 in

## 2023-12-18 DIAGNOSIS — Z86718 Personal history of other venous thrombosis and embolism: Secondary | ICD-10-CM

## 2023-12-18 DIAGNOSIS — M532X6 Spinal instabilities, lumbar region: Secondary | ICD-10-CM

## 2023-12-18 DIAGNOSIS — M4316 Spondylolisthesis, lumbar region: Secondary | ICD-10-CM

## 2023-12-18 DIAGNOSIS — G8929 Other chronic pain: Secondary | ICD-10-CM

## 2023-12-18 DIAGNOSIS — I341 Nonrheumatic mitral (valve) prolapse: Secondary | ICD-10-CM | POA: Diagnosis not present

## 2023-12-18 DIAGNOSIS — Z01818 Encounter for other preprocedural examination: Secondary | ICD-10-CM

## 2023-12-18 DIAGNOSIS — Z8679 Personal history of other diseases of the circulatory system: Secondary | ICD-10-CM

## 2023-12-18 DIAGNOSIS — M7138 Other bursal cyst, other site: Secondary | ICD-10-CM

## 2023-12-18 LAB — URINALYSIS, COMPLETE (UACMP) WITH MICROSCOPIC
Bacteria, UA: NONE SEEN
Bilirubin Urine: NEGATIVE
Glucose, UA: NEGATIVE mg/dL
Hgb urine dipstick: NEGATIVE
Ketones, ur: NEGATIVE mg/dL
Leukocytes,Ua: NEGATIVE
Nitrite: NEGATIVE
Protein, ur: NEGATIVE mg/dL
RBC / HPF: 0 RBC/hpf (ref 0–5)
Specific Gravity, Urine: 1.021 (ref 1.005–1.030)
WBC, UA: 0 WBC/hpf (ref 0–5)
pH: 5 (ref 5.0–8.0)

## 2023-12-18 LAB — CBC
HCT: 36.1 % (ref 36.0–46.0)
Hemoglobin: 12.1 g/dL (ref 12.0–15.0)
MCH: 34 pg (ref 26.0–34.0)
MCHC: 33.5 g/dL (ref 30.0–36.0)
MCV: 101.4 fL — ABNORMAL HIGH (ref 80.0–100.0)
Platelets: 255 K/uL (ref 150–400)
RBC: 3.56 MIL/uL — ABNORMAL LOW (ref 3.87–5.11)
RDW: 11.6 % (ref 11.5–15.5)
WBC: 7.4 K/uL (ref 4.0–10.5)
nRBC: 0 % (ref 0.0–0.2)

## 2023-12-18 LAB — BASIC METABOLIC PANEL WITH GFR
Anion gap: 7 (ref 5–15)
BUN: 16 mg/dL (ref 6–20)
CO2: 29 mmol/L (ref 22–32)
Calcium: 9.2 mg/dL (ref 8.9–10.3)
Chloride: 103 mmol/L (ref 98–111)
Creatinine, Ser: 0.72 mg/dL (ref 0.44–1.00)
GFR, Estimated: >60 mL/min (ref >60)
Glucose, Bld: 97 mg/dL (ref 70–99)
Potassium: 4.2 mmol/L (ref 3.5–5.1)
Sodium: 139 mmol/L (ref 135–145)

## 2023-12-18 LAB — SURGICAL PCR SCREEN
MRSA, PCR: NEGATIVE
Staphylococcus aureus: POSITIVE — AB

## 2023-12-18 LAB — TYPE AND SCREEN
ABO/RH(D): A POS
Antibody Screen: NEGATIVE

## 2023-12-18 NOTE — Patient Instructions (Addendum)
 Your procedure is scheduled on: Monday 12/25/23 Report to the Registration Desk on the 1st floor of the [facility]. To find out your arrival time, please call [phone] between 1PM - 3PM on: Friday 12/22/23 If your arrival time is 6:00 am, do not arrive before that time as the [facility] entrance doors do not open until 6:00 am.  REMEMBER: Instructions that are not followed completely may result in serious medical risk, up to and including death; or upon the discretion of your surgeon and anesthesiologist your surgery may need to be rescheduled.  Do not eat food after midnight the night before surgery.  No gum chewing or hard candies.  You may however, drink CLEAR liquids up to 2 hours before you are scheduled to arrive for your surgery. Do not drink anything within 2 hours of your scheduled arrival time.  Clear liquids include: - water   - apple juice without pulp - gatorade (not RED colors) - black coffee or tea (Do NOT add milk or creamers to the coffee or tea) Do NOT drink anything that is not on this list.  One week prior to surgery: Stop Anti-inflammatories (NSAIDS) such as Advil, Aleve , Ibuprofen, Motrin, Naproxen , Naprosyn  and Aspirin based products such as Excedrin, Goody's Powder, BC Powder. Stop Diclofenac  Today.  You may however, continue to take Tylenol  if needed for pain up until the day of surgery.  Stop ANY OVER THE COUNTER supplements and vitamins Today until after surgery.  Continue taking all of your other prescription medications up until the day of surgery.  ON THE DAY OF SURGERY ONLY TAKE THESE MEDICATIONS WITH SIPS OF WATER :  gabapentin  (NEURONTIN ) 300 MG capsule  lamoTRIgine  (LAMICTAL ) 200 MG tablet  sertraline  (ZOLOFT ) 50 MG tablet  valACYclovir  (VALTREX ) 1000 MG tablet   No Alcohol for 24 hours before or after surgery.  No Smoking including e-cigarettes for 24 hours before surgery.  No chewable tobacco products for at least 6 hours before  surgery.  No nicotine  patches on the day of surgery.  Do not use any recreational drugs for at least a week (preferably 2 weeks) before your surgery.  Please be advised that the combination of cocaine and anesthesia may have negative outcomes, up to and including death. If you test positive for cocaine, your surgery will be cancelled.  On the morning of surgery brush your teeth with toothpaste and water , you may rinse your mouth with mouthwash if you wish. Do not swallow any toothpaste or mouthwash.  Use CHG Soap or wipes as directed on instruction sheet.  Do not shave body hair from the neck down 48 hours before surgery.  Do not wear lotions, powders, or perfumes on the day of surgery  Wear comfortable clothing (specific to your surgery type) to the hospital.  Do not wear jewelry, make-up, hairpins, clips or nail polish.  For welded (permanent) jewelry: bracelets, anklets, waist bands, etc.  Please have this removed prior to surgery.  If it is not removed, there is a chance that hospital personnel will need to cut it off on the day of surgery.  Contact lenses, hearing aids and dentures may not be worn into surgery.  Do not bring valuables to the hospital. [facility] is not responsible for any missing/lost belongings or valuables.   Notify your doctor if there is any change in your medical condition (cold, fever, infection).  After surgery, you can help prevent lung complications by doing breathing exercises.  Take deep breaths and cough every 1-2  hours. Your doctor may order a device called an Incentive Spirometer to help you take deep breaths.  If you are being admitted to the hospital overnight, leave your suitcase in the car. After surgery it may be brought to your room.  In case of increased patient census, it may be necessary for you, the patient, to continue your postoperative care in the Same Day Surgery department.  Please call the Pre-admissions Testing Dept. at [phone] if you have any questions about these instructions.  Surgery Visitation Policy:  Patients having surgery or a procedure may have two visitors.  Children under the age of 61 must have an adult with them who is not the patient.  Inpatient Visitation:    Visiting hours are 7 a.m. to 8 p.m. Up to four visitors are allowed at one time in a patient room. The visitors may rotate out with other people during the day.  One visitor age 75 or older may stay with the patient overnight and must be in the room by 8 p.m.   Merchandiser, Retail to address health-related social needs:  [email or web address]    Pre-operative 4 CHG Bath Instructions   You can play a key role in reducing the risk of infection after surgery. Your skin needs to be as free of germs as possible. You can reduce the number of germs on your skin by washing with CHG (chlorhexidine gluconate) soap before surgery. CHG is an antiseptic soap that kills germs and continues to kill germs even after washing.   DO NOT use if you have an allergy  to chlorhexidine/CHG or antibacterial soaps. If your skin becomes reddened or irritated, stop using the CHG and notify one of our RNs at [phone].   Please shower with the CHG soap starting 4 days before surgery using the following schedule:   Thursday 12/21/23 - Sunday 12/24/23    Please keep in mind the following:  DO NOT shave, including legs and underarms, starting the day of your first shower.   You may shave your face at any point before/day of surgery.  Place clean sheets on your bed the day you start using CHG soap. Use a clean washcloth (not used since being washed) for each shower. DO NOT sleep with pets once you start using the CHG.   CHG Shower Instructions:  If you choose to wash your hair and private area, wash first with your normal shampoo/soap.  After you use shampoo/soap, rinse your hair and body thoroughly to remove shampoo/soap residue.   Turn the water  OFF and apply about 3 tablespoons (45 ml) of CHG soap to a CLEAN washcloth.  Apply CHG soap ONLY FROM YOUR NECK DOWN TO YOUR TOES (washing for 3-5 minutes)  DO NOT use CHG soap on face, private areas, open wounds, or sores.  Pay special attention to the area where your surgery is being performed.  If you are having back surgery, having someone wash your back for you may be helpful. Wait 2 minutes after CHG soap is applied, then you may rinse off the CHG soap.  Pat dry with a clean towel  Put on clean clothes/pajamas   If you choose to wear lotion, please use ONLY the CHG-compatible lotions on the back of this paper.     Additional instructions for the day of surgery: DO NOT APPLY any lotions, deodorants, cologne, or perfumes.   Put on clean/comfortable clothes.  Brush your teeth.  Ask your nurse before applying any prescription medications to the  skin.      CHG Compatible Lotions   Aveeno Moisturizing lotion  Cetaphil Moisturizing Cream  Cetaphil Moisturizing Lotion  Clairol Herbal Essence Moisturizing Lotion, Dry Skin  Clairol Herbal Essence Moisturizing Lotion, Extra Dry Skin  Clairol Herbal Essence Moisturizing Lotion, Normal Skin  Curel Age Defying Therapeutic Moisturizing Lotion with Alpha Hydroxy  Curel Extreme Care Body Lotion  Curel Soothing Hands Moisturizing Hand Lotion  Curel Therapeutic Moisturizing Cream, Fragrance-Free  Curel Therapeutic Moisturizing Lotion, Fragrance-Free  Curel Therapeutic Moisturizing Lotion, Original Formula  Eucerin Daily Replenishing Lotion  Eucerin Dry Skin Therapy Plus Alpha Hydroxy Crme  Eucerin Dry Skin Therapy Plus Alpha Hydroxy Lotion  Eucerin Original Crme  Eucerin Original Lotion  Eucerin Plus Crme Eucerin Plus Lotion  Eucerin TriLipid Replenishing Lotion  Keri Anti-Bacterial Hand Lotion  Keri Deep Conditioning Original Lotion Dry Skin Formula Softly Scented  Keri Deep Conditioning Original Lotion, Fragrance  Free Sensitive Skin Formula  Keri Lotion Fast Absorbing Fragrance Free Sensitive Skin Formula  Keri Lotion Fast Absorbing Softly Scented Dry Skin Formula  Keri Original Lotion  Keri Skin Renewal Lotion Keri Silky Smooth Lotion  Keri Silky Smooth Sensitive Skin Lotion  Nivea Body Creamy Conditioning Oil  Nivea Body Extra Enriched Lotion  Nivea Body Original Lotion  Nivea Body Sheer Moisturizing Lotion Nivea Crme  Nivea Skin Firming Lotion  NutraDerm 30 Skin Lotion  NutraDerm Skin Lotion  NutraDerm Therapeutic Skin Cream  NutraDerm Therapeutic Skin Lotion  ProShield Protective Hand Cream  Provon moisturizing lotion  How to Use an Incentive Spirometer  An incentive spirometer is a tool that measures how well you are filling your lungs with each breath. Learning to take long, deep breaths using this tool can help you keep your lungs clear and active. This may help to reverse or lessen your chance of developing breathing (pulmonary) problems, especially infection. You may be asked to use a spirometer: After a surgery. If you have a lung problem or a history of smoking. After a long period of time when you have been unable to move or be active. If the spirometer includes an indicator to show the highest number that you have reached, your health care provider or respiratory therapist will help you set a goal. Keep a log of your progress as told by your health care provider. What are the risks? Breathing too quickly may cause dizziness or cause you to pass out. Take your time so you do not get dizzy or light-headed. If you are in pain, you may need to take pain medicine before doing incentive spirometry. It is harder to take a deep breath if you are having pain. How to use your incentive spirometer  Sit up on the edge of your bed or on a chair. Hold the incentive spirometer so that it is in an upright position. Before you use the spirometer, breathe out normally. Place the mouthpiece in  your mouth. Make sure your lips are closed tightly around it. Breathe in slowly and as deeply as you can through your mouth, causing the piston or the ball to rise toward the top of the chamber. Hold your breath for 3-5 seconds, or for as long as possible. If the spirometer includes a coach indicator, use this to guide you in breathing. Slow down your breathing if the indicator goes above the marked areas. Remove the mouthpiece from your mouth and breathe out normally. The piston or ball will return to the bottom of the chamber. Rest for a few seconds, then  repeat the steps 10 or more times. Take your time and take a few normal breaths between deep breaths so that you do not get dizzy or light-headed. Do this every 1-2 hours when you are awake. If the spirometer includes a goal marker to show the highest number you have reached (best effort), use this as a goal to work toward during each repetition. After each set of 10 deep breaths, cough a few times. This will help to make sure that your lungs are clear. If you have an incision on your chest or abdomen from surgery, place a pillow or a rolled-up towel firmly against the incision when you cough. This can help to reduce pain while taking deep breaths and coughing. General tips When you are able to get out of bed: Walk around often. Continue to take deep breaths and cough in order to clear your lungs. Keep using the incentive spirometer until your health care provider says it is okay to stop using it. If you have been in the hospital, you may be told to keep using the spirometer at home. Contact a health care provider if: You are having difficulty using the spirometer. You have trouble using the spirometer as often as instructed. Your pain medicine is not giving enough relief for you to use the spirometer as told. You have a fever. Get help right away if: You develop shortness of breath. You develop a cough with bloody mucus from the  lungs. You have fluid or blood coming from an incision site after you cough. Summary An incentive spirometer is a tool that can help you learn to take long, deep breaths to keep your lungs clear and active. You may be asked to use a spirometer after a surgery, if you have a lung problem or a history of smoking, or if you have been inactive for a long period of time. Use your incentive spirometer as instructed every 1-2 hours while you are awake. If you have an incision on your chest or abdomen, place a pillow or a rolled-up towel firmly against your incision when you cough. This will help to reduce pain. Get help right away if you have shortness of breath, you cough up bloody mucus, or blood comes from your incision when you cough. This information is not intended to replace advice given to you by your health care provider. Make sure you discuss any questions you have with your health care provider. Document Revised: 03/18/2019 Document Reviewed: 03/18/2019 Elsevier Patient Education  2023 [employer].

## 2023-12-25 ENCOUNTER — Observation Stay
Admission: RE | Admit: 2023-12-25 | Discharge: 2023-12-29 | DRG: 428 | Disposition: A | Attending: Neurosurgery | Admitting: Neurosurgery

## 2023-12-25 ENCOUNTER — Ambulatory Visit: Admitting: General Practice

## 2023-12-25 ENCOUNTER — Other Ambulatory Visit: Payer: Self-pay

## 2023-12-25 ENCOUNTER — Encounter: Payer: Self-pay | Admitting: Neurosurgery

## 2023-12-25 ENCOUNTER — Encounter: Admission: RE | Payer: Self-pay | Source: Ambulatory Visit

## 2023-12-25 ENCOUNTER — Ambulatory Visit

## 2023-12-25 DIAGNOSIS — Z01818 Encounter for other preprocedural examination: Secondary | ICD-10-CM

## 2023-12-25 DIAGNOSIS — Z8052 Family history of malignant neoplasm of bladder: Secondary | ICD-10-CM

## 2023-12-25 DIAGNOSIS — M532X6 Spinal instabilities, lumbar region: Secondary | ICD-10-CM | POA: Diagnosis present

## 2023-12-25 DIAGNOSIS — G2581 Restless legs syndrome: Secondary | ICD-10-CM | POA: Diagnosis present

## 2023-12-25 DIAGNOSIS — G8929 Other chronic pain: Secondary | ICD-10-CM | POA: Diagnosis present

## 2023-12-25 DIAGNOSIS — Z981 Arthrodesis status: Principal | ICD-10-CM

## 2023-12-25 DIAGNOSIS — M7138 Other bursal cyst, other site: Secondary | ICD-10-CM | POA: Diagnosis not present

## 2023-12-25 DIAGNOSIS — M51379 Other intervertebral disc degeneration, lumbosacral region without mention of lumbar back pain or lower extremity pain: Secondary | ICD-10-CM | POA: Diagnosis present

## 2023-12-25 DIAGNOSIS — Z888 Allergy status to other drugs, medicaments and biological substances status: Secondary | ICD-10-CM

## 2023-12-25 DIAGNOSIS — M5442 Lumbago with sciatica, left side: Secondary | ICD-10-CM | POA: Diagnosis not present

## 2023-12-25 DIAGNOSIS — M4316 Spondylolisthesis, lumbar region: Secondary | ICD-10-CM | POA: Diagnosis present

## 2023-12-25 DIAGNOSIS — M48061 Spinal stenosis, lumbar region without neurogenic claudication: Principal | ICD-10-CM | POA: Diagnosis present

## 2023-12-25 DIAGNOSIS — F17210 Nicotine dependence, cigarettes, uncomplicated: Secondary | ICD-10-CM | POA: Diagnosis present

## 2023-12-25 DIAGNOSIS — I951 Orthostatic hypotension: Secondary | ICD-10-CM | POA: Diagnosis not present

## 2023-12-25 DIAGNOSIS — Z800 Family history of malignant neoplasm of digestive organs: Secondary | ICD-10-CM

## 2023-12-25 DIAGNOSIS — Z79899 Other long term (current) drug therapy: Secondary | ICD-10-CM

## 2023-12-25 DIAGNOSIS — M5441 Lumbago with sciatica, right side: Secondary | ICD-10-CM | POA: Diagnosis not present

## 2023-12-25 DIAGNOSIS — Z8041 Family history of malignant neoplasm of ovary: Secondary | ICD-10-CM

## 2023-12-25 DIAGNOSIS — Z833 Family history of diabetes mellitus: Secondary | ICD-10-CM

## 2023-12-25 DIAGNOSIS — M4317 Spondylolisthesis, lumbosacral region: Secondary | ICD-10-CM | POA: Diagnosis present

## 2023-12-25 DIAGNOSIS — Z8049 Family history of malignant neoplasm of other genital organs: Secondary | ICD-10-CM

## 2023-12-25 HISTORY — PX: APPLICATION OF INTRAOPERATIVE CT SCAN: SHX6668

## 2023-12-25 HISTORY — PX: TRANSFORAMINAL LUMBAR INTERBODY FUSION W/ MIS 2 LEVEL: SHX6146

## 2023-12-25 LAB — ABO/RH: ABO/RH(D): A POS

## 2023-12-25 LAB — POCT PREGNANCY, URINE: Preg Test, Ur: NEGATIVE

## 2023-12-25 SURGERY — MINIMALLY INVASIVE (MIS) TRANSFORAMINAL LUMBAR INTERBODY FUSION (TLIF) 2 LEVEL
Anesthesia: General | Site: Back

## 2023-12-25 MED ORDER — ONDANSETRON HCL 4 MG PO TABS
4.0000 mg | ORAL_TABLET | Freq: Four times a day (QID) | ORAL | Status: DC | PRN
  Administered 2023-12-26: 10:00:00 4 mg via ORAL
  Filled 2023-12-25: qty 1

## 2023-12-25 MED ORDER — SERTRALINE HCL 50 MG PO TABS
50.0000 mg | ORAL_TABLET | Freq: Every day | ORAL | Status: DC
  Administered 2023-12-26 – 2023-12-29 (×4): 50 mg via ORAL
  Filled 2023-12-25 (×4): qty 1

## 2023-12-25 MED ORDER — VANCOMYCIN HCL IN DEXTROSE 1-5 GM/200ML-% IV SOLN
INTRAVENOUS | Status: AC
  Filled 2023-12-25: qty 200

## 2023-12-25 MED ORDER — PROPOFOL 1000 MG/100ML IV EMUL
INTRAVENOUS | Status: AC
  Filled 2023-12-25: qty 100

## 2023-12-25 MED ORDER — SENNA 8.6 MG PO TABS
1.0000 | ORAL_TABLET | Freq: Two times a day (BID) | ORAL | Status: DC
  Administered 2023-12-25 – 2023-12-28 (×7): 8.6 mg via ORAL
  Filled 2023-12-25 (×8): qty 1

## 2023-12-25 MED ORDER — SORBITOL 70 % SOLN: 30.0000 mL | Freq: Every day | Status: DC | PRN

## 2023-12-25 MED ORDER — FENTANYL CITRATE (PF) 100 MCG/2ML IJ SOLN
INTRAMUSCULAR | Status: AC
  Filled 2023-12-25: qty 2

## 2023-12-25 MED ORDER — CHLORHEXIDINE GLUCONATE 4 % EX SOLN
1.0000 | CUTANEOUS | 1 refills | Status: DC
  Filled 2023-12-25: qty 236, 5d supply, fill #0

## 2023-12-25 MED ORDER — GABAPENTIN 300 MG PO CAPS
600.0000 mg | ORAL_CAPSULE | Freq: Every day | ORAL | Status: DC
  Administered 2023-12-25 – 2023-12-28 (×4): 600 mg via ORAL
  Filled 2023-12-25 (×4): qty 2

## 2023-12-25 MED ORDER — ONDANSETRON HCL 4 MG/2ML IJ SOLN
4.0000 mg | Freq: Four times a day (QID) | INTRAMUSCULAR | Status: DC | PRN
  Administered 2023-12-25: 23:00:00 4 mg via INTRAVENOUS
  Filled 2023-12-25: qty 2

## 2023-12-25 MED ORDER — CEFAZOLIN SODIUM-DEXTROSE 2-4 GM/100ML-% IV SOLN
2.0000 g | Freq: Once | INTRAVENOUS | Status: AC
  Administered 2023-12-25: 14:00:00 2 g via INTRAVENOUS

## 2023-12-25 MED ORDER — LAMOTRIGINE 25 MG PO TABS
200.0000 mg | ORAL_TABLET | Freq: Every morning | ORAL | Status: DC
  Administered 2023-12-26 – 2023-12-29 (×4): 200 mg via ORAL
  Filled 2023-12-25 (×4): qty 8

## 2023-12-25 MED ORDER — VANCOMYCIN HCL IN DEXTROSE 1-5 GM/200ML-% IV SOLN
1000.0000 mg | Freq: Once | INTRAVENOUS | Status: AC
  Administered 2023-12-25: 13:00:00 1000 mg via INTRAVENOUS

## 2023-12-25 MED ORDER — ONDANSETRON HCL 4 MG/2ML IJ SOLN
INTRAMUSCULAR | Status: AC
  Filled 2023-12-25: qty 2

## 2023-12-25 MED ORDER — BUPIVACAINE HCL (PF) 0.5 % IJ SOLN
INTRAMUSCULAR | Status: DC | PRN
  Administered 2023-12-25: 18:00:00 30 mL

## 2023-12-25 MED ORDER — HYDROMORPHONE HCL 1 MG/ML IJ SOLN
INTRAMUSCULAR | Status: AC
  Filled 2023-12-25: qty 1

## 2023-12-25 MED ORDER — SUCCINYLCHOLINE CHLORIDE 200 MG/10ML IV SOSY
PREFILLED_SYRINGE | INTRAVENOUS | Status: AC
  Filled 2023-12-25: qty 10

## 2023-12-25 MED ORDER — ORAL CARE MOUTH RINSE: 15.0000 mL | Freq: Once | OROMUCOSAL | Status: AC

## 2023-12-25 MED ORDER — OXYCODONE HCL 5 MG PO TABS
5.0000 mg | ORAL_TABLET | Freq: Once | ORAL | Status: AC | PRN
  Administered 2023-12-25: 19:00:00 5 mg via ORAL

## 2023-12-25 MED ORDER — OXYCODONE HCL 5 MG PO TABS
5.0000 mg | ORAL_TABLET | ORAL | Status: DC | PRN
  Administered 2023-12-25 – 2023-12-28 (×6): 5 mg via ORAL
  Filled 2023-12-25 (×7): qty 1

## 2023-12-25 MED ORDER — OXYCODONE HCL 5 MG PO TABS
10.0000 mg | ORAL_TABLET | ORAL | Status: DC | PRN
  Administered 2023-12-27 (×2): 10 mg via ORAL
  Filled 2023-12-25 (×3): qty 2

## 2023-12-25 MED ORDER — VALACYCLOVIR HCL 500 MG PO TABS
1000.0000 mg | ORAL_TABLET | Freq: Every day | ORAL | Status: DC
  Administered 2023-12-26 – 2023-12-29 (×4): 1000 mg via ORAL
  Filled 2023-12-25 (×4): qty 2

## 2023-12-25 MED ORDER — MENTHOL 3 MG MT LOZG: 1.0000 | LOZENGE | OROMUCOSAL | Status: DC | PRN

## 2023-12-25 MED ORDER — TIZANIDINE HCL 4 MG PO TABS
4.0000 mg | ORAL_TABLET | Freq: Three times a day (TID) | ORAL | Status: DC | PRN
  Administered 2023-12-27 (×2): 4 mg via ORAL
  Filled 2023-12-25 (×2): qty 1

## 2023-12-25 MED ORDER — SURGIFLO WITH THROMBIN (HEMOSTATIC MATRIX KIT) OPTIME
TOPICAL | Status: DC | PRN
  Administered 2023-12-25: 16:00:00 1 via TOPICAL

## 2023-12-25 MED ORDER — ACETAMINOPHEN 10 MG/ML IV SOLN
INTRAVENOUS | Status: DC | PRN
  Administered 2023-12-25: 17:00:00 1000 mg via INTRAVENOUS

## 2023-12-25 MED ORDER — LACTATED RINGERS IV SOLN: INTRAVENOUS | Status: DC | PRN

## 2023-12-25 MED ORDER — BUPIVACAINE-EPINEPHRINE (PF) 0.5% -1:200000 IJ SOLN
INTRAMUSCULAR | Status: AC
  Filled 2023-12-25: qty 10

## 2023-12-25 MED ORDER — IRRISEPT - 450ML BOTTLE WITH 0.05% CHG IN STERILE WATER, USP 99.95% OPTIME
TOPICAL | Status: DC | PRN
  Administered 2023-12-25: 18:00:00 450 mL via TOPICAL

## 2023-12-25 MED ORDER — SODIUM CHLORIDE 0.9% FLUSH
3.0000 mL | Freq: Two times a day (BID) | INTRAVENOUS | Status: DC
  Administered 2023-12-25 – 2023-12-29 (×8): 3 mL via INTRAVENOUS

## 2023-12-25 MED ORDER — OXYCODONE HCL 5 MG/5ML PO SOLN: 5.0000 mg | Freq: Once | ORAL | Status: AC | PRN

## 2023-12-25 MED ORDER — FENTANYL CITRATE (PF) 100 MCG/2ML IJ SOLN
INTRAMUSCULAR | Status: DC | PRN
  Administered 2023-12-25 (×2): 50 ug via INTRAVENOUS

## 2023-12-25 MED ORDER — ACETAMINOPHEN 500 MG PO TABS
1000.0000 mg | ORAL_TABLET | Freq: Four times a day (QID) | ORAL | Status: DC
  Administered 2023-12-25 – 2023-12-29 (×12): 1000 mg via ORAL
  Filled 2023-12-25 (×13): qty 2

## 2023-12-25 MED ORDER — EPHEDRINE SULFATE-NACL 50-0.9 MG/10ML-% IV SOSY
PREFILLED_SYRINGE | INTRAVENOUS | Status: DC | PRN
  Administered 2023-12-25 (×2): 10 mg via INTRAVENOUS
  Administered 2023-12-25: 17:00:00 5 mg via INTRAVENOUS

## 2023-12-25 MED ORDER — PROPOFOL 10 MG/ML IV BOLUS
INTRAVENOUS | Status: DC | PRN
  Administered 2023-12-25: 14:00:00 150 mg via INTRAVENOUS
  Administered 2023-12-25: 14:00:00 50 mg via INTRAVENOUS

## 2023-12-25 MED ORDER — SODIUM CHLORIDE 0.9% FLUSH: 3.0000 mL | INTRAVENOUS | Status: DC | PRN

## 2023-12-25 MED ORDER — MIDAZOLAM HCL 2 MG/2ML IJ SOLN
INTRAMUSCULAR | Status: AC
  Filled 2023-12-25: qty 2

## 2023-12-25 MED ORDER — DEXAMETHASONE SOD PHOSPHATE PF 10 MG/ML IJ SOLN
INTRAMUSCULAR | Status: DC | PRN
  Administered 2023-12-25: 15:00:00 10 mg via INTRAVENOUS

## 2023-12-25 MED ORDER — MIDAZOLAM HCL (PF) 2 MG/2ML IJ SOLN
INTRAMUSCULAR | Status: DC | PRN
  Administered 2023-12-25: 14:00:00 2 mg via INTRAVENOUS

## 2023-12-25 MED ORDER — MUPIROCIN 2 % EX OINT
1.0000 | TOPICAL_OINTMENT | Freq: Two times a day (BID) | CUTANEOUS | 0 refills | Status: AC
  Filled 2023-12-25: qty 22, 11d supply, fill #0

## 2023-12-25 MED ORDER — POLYETHYLENE GLYCOL 3350 17 G PO PACK
17.0000 g | PACK | Freq: Every day | ORAL | Status: DC | PRN
  Filled 2023-12-25: qty 1

## 2023-12-25 MED ORDER — FENTANYL CITRATE (PF) 100 MCG/2ML IJ SOLN
25.0000 ug | INTRAMUSCULAR | Status: DC | PRN
  Administered 2023-12-25: 19:00:00 50 ug via INTRAVENOUS
  Administered 2023-12-25 (×4): 25 ug via INTRAVENOUS

## 2023-12-25 MED ORDER — EPHEDRINE 5 MG/ML INJ
INTRAVENOUS | Status: AC
  Filled 2023-12-25: qty 5

## 2023-12-25 MED ORDER — 0.9 % SODIUM CHLORIDE (POUR BTL) OPTIME
TOPICAL | Status: DC | PRN
  Administered 2023-12-25: 15:00:00 500 mL

## 2023-12-25 MED ORDER — PNEUMOCOCCAL 20-VAL CONJ VACC 0.5 ML IM SUSY: 0.5000 mL | PREFILLED_SYRINGE | INTRAMUSCULAR | Status: DC

## 2023-12-25 MED ORDER — OXYCODONE HCL 5 MG PO TABS
ORAL_TABLET | ORAL | Status: AC
  Filled 2023-12-25: qty 1

## 2023-12-25 MED ORDER — HYDROMORPHONE HCL 1 MG/ML IJ SOLN
INTRAMUSCULAR | Status: DC | PRN
  Administered 2023-12-25: 18:00:00 1 mg via INTRAVENOUS

## 2023-12-25 MED ORDER — ONDANSETRON HCL 4 MG/2ML IJ SOLN
INTRAMUSCULAR | Status: DC | PRN
  Administered 2023-12-25: 18:00:00 4 mg via INTRAVENOUS

## 2023-12-25 MED ORDER — PHENYLEPHRINE HCL-NACL 20-0.9 MG/250ML-% IV SOLN
INTRAVENOUS | Status: AC
  Filled 2023-12-25: qty 250

## 2023-12-25 MED ORDER — HYDROMORPHONE HCL 1 MG/ML IJ SOLN: 0.5000 mg | INTRAMUSCULAR | Status: DC | PRN

## 2023-12-25 MED ORDER — LIDOCAINE HCL (PF) 2 % IJ SOLN
INTRAMUSCULAR | Status: AC
  Filled 2023-12-25: qty 5

## 2023-12-25 MED ORDER — PHENYLEPHRINE HCL-NACL 20-0.9 MG/250ML-% IV SOLN
INTRAVENOUS | Status: DC | PRN
  Administered 2023-12-25: 17:00:00 25 ug/min via INTRAVENOUS

## 2023-12-25 MED ORDER — GABAPENTIN 300 MG PO CAPS
300.0000 mg | ORAL_CAPSULE | Freq: Every day | ORAL | Status: DC
  Administered 2023-12-26 – 2023-12-29 (×4): 300 mg via ORAL
  Filled 2023-12-25 (×4): qty 1

## 2023-12-25 MED ORDER — PHENOL 1.4 % MT LIQD: 1.0000 | OROMUCOSAL | Status: DC | PRN

## 2023-12-25 MED ORDER — ACETAMINOPHEN 10 MG/ML IV SOLN
INTRAVENOUS | Status: AC
  Filled 2023-12-25: qty 100

## 2023-12-25 MED ORDER — CHLORHEXIDINE GLUCONATE 0.12 % MT SOLN
OROMUCOSAL | Status: AC
  Filled 2023-12-25: qty 15

## 2023-12-25 MED ORDER — LIDOCAINE HCL (CARDIAC) PF 100 MG/5ML IV SOSY
PREFILLED_SYRINGE | INTRAVENOUS | Status: DC | PRN
  Administered 2023-12-25: 14:00:00 80 mg via INTRAVENOUS

## 2023-12-25 MED ORDER — SUGAMMADEX SODIUM 200 MG/2ML IV SOLN
INTRAVENOUS | Status: DC | PRN
  Administered 2023-12-25: 18:00:00 200 mg via INTRAVENOUS

## 2023-12-25 MED ORDER — ROCURONIUM BROMIDE 100 MG/10ML IV SOLN
INTRAVENOUS | Status: DC | PRN
  Administered 2023-12-25: 16:00:00 20 mg via INTRAVENOUS
  Administered 2023-12-25: 17:00:00 10 mg via INTRAVENOUS
  Administered 2023-12-25: 15:00:00 20 mg via INTRAVENOUS
  Administered 2023-12-25: 14:00:00 60 mg via INTRAVENOUS
  Administered 2023-12-25: 17:00:00 20 mg via INTRAVENOUS
  Administered 2023-12-25: 17:00:00 10 mg via INTRAVENOUS

## 2023-12-25 MED ORDER — DOCUSATE SODIUM 100 MG PO CAPS
100.0000 mg | ORAL_CAPSULE | Freq: Two times a day (BID) | ORAL | Status: DC
  Administered 2023-12-25 – 2023-12-28 (×7): 100 mg via ORAL
  Filled 2023-12-25 (×8): qty 1

## 2023-12-25 MED ORDER — SODIUM CHLORIDE 0.9 % IV SOLN: 250.0000 mL | INTRAVENOUS | Status: AC

## 2023-12-25 MED ORDER — BUPIVACAINE HCL (PF) 0.5 % IJ SOLN
INTRAMUSCULAR | Status: AC
  Filled 2023-12-25: qty 30

## 2023-12-25 MED ORDER — MAGNESIUM CITRATE PO SOLN: 1.0000 | Freq: Once | ORAL | Status: DC | PRN

## 2023-12-25 MED ORDER — CEFAZOLIN SODIUM-DEXTROSE 2-4 GM/100ML-% IV SOLN
INTRAVENOUS | Status: AC
  Filled 2023-12-25: qty 100

## 2023-12-25 MED ORDER — LACTATED RINGERS IV SOLN: INTRAVENOUS | Status: DC

## 2023-12-25 MED ORDER — CHLORHEXIDINE GLUCONATE 0.12 % MT SOLN
15.0000 mL | Freq: Once | OROMUCOSAL | Status: AC
  Administered 2023-12-25: 12:00:00 15 mL via OROMUCOSAL

## 2023-12-25 MED ORDER — KETOROLAC TROMETHAMINE 15 MG/ML IJ SOLN
15.0000 mg | Freq: Four times a day (QID) | INTRAMUSCULAR | Status: AC
  Administered 2023-12-25 – 2023-12-26 (×4): 15 mg via INTRAVENOUS
  Filled 2023-12-25 (×4): qty 1

## 2023-12-25 MED ORDER — ENOXAPARIN SODIUM 40 MG/0.4ML IJ SOSY
40.0000 mg | PREFILLED_SYRINGE | INTRAMUSCULAR | Status: DC
  Administered 2023-12-26 – 2023-12-29 (×4): 40 mg via SUBCUTANEOUS
  Filled 2023-12-25 (×4): qty 0.4

## 2023-12-25 SURGICAL SUPPLY — 51 items
ALLOGRAFT BONE FIBER KORE 5 (Bone Implant) IMPLANT
BASIN KIT SINGLE STR (MISCELLANEOUS) ×1 IMPLANT
BUR NEURO DRILL SOFT 3.0X3.8M (BURR) ×1 IMPLANT
DERMABOND ADVANCED .7 DNX12 (GAUZE/BANDAGES/DRESSINGS) ×1 IMPLANT
DRAPE C-ARMOR (DRAPES) IMPLANT
DRAPE HD 5FT BACK TABLE (DRAPES) IMPLANT
DRAPE LAPAROTOMY 100X77 ABD (DRAPES) ×1 IMPLANT
DRAPE SCAN PATIENT (DRAPES) ×1 IMPLANT
DRAPE SPINE LEICA/WILD 54X150 (DRAPES) IMPLANT
DRAPE TABLE BACK 80X90 (DRAPES) ×1 IMPLANT
DRSG OPSITE POSTOP 4X8 (GAUZE/BANDAGES/DRESSINGS) IMPLANT
DRSG TEGADERM 4X4.75 (GAUZE/BANDAGES/DRESSINGS) IMPLANT
ELECTRODE EZSTD 165MM 6.5IN (MISCELLANEOUS) IMPLANT
ELECTRODE REM PT RTRN 9FT ADLT (ELECTROSURGICAL) ×1 IMPLANT
EVACUATOR 1/8 PVC DRAIN (DRAIN) IMPLANT
EX-PIN ORTHOLOCK NAV 4X150 (PIN) IMPLANT
FEE CVG SUPP BRAINLAB NG SPNE (MISCELLANEOUS) ×1 IMPLANT
FEE INTRAOP CADWELL SUPPLY NCS (MISCELLANEOUS) IMPLANT
FEE INTRAOP MONITOR IMPULS NCS (MISCELLANEOUS) IMPLANT
GAUZE 4X4 16PLY ~~LOC~~+RFID DBL (SPONGE) IMPLANT
GAUZE SPONGE 2X2 STRL 8-PLY (GAUZE/BANDAGES/DRESSINGS) IMPLANT
GLOVE BIOGEL PI IND STRL 6.5 (GLOVE) ×2 IMPLANT
GLOVE SURG SYN 6.5 PF PI (GLOVE) ×2 IMPLANT
GLOVE SURG SYN 8.5 PF PI (GLOVE) ×4 IMPLANT
GOWN SRG LRG LVL 4 IMPRV REINF (GOWNS) ×3 IMPLANT
GOWN SRG XL LVL 3 NONREINFORCE (GOWNS) ×1 IMPLANT
GUIDEWIRE NITINOL BEVEL TIP (WIRE) IMPLANT
HOLDER FOLEY CATH W/STRAP (MISCELLANEOUS) IMPLANT
INTERBODY SABLE 10X26 7-14 15D (Miscellaneous) IMPLANT
KIT PREVENA INCISION MGT 13 (CANNISTER) IMPLANT
KIT SPINAL PRONEVIEW (KITS) ×1 IMPLANT
LAVAGE JET IRRISEPT WOUND (IRRIGATION / IRRIGATOR) ×1 IMPLANT
MANIFOLD NEPTUNE II (INSTRUMENTS) ×1 IMPLANT
MARKER SPHERE PSV REFLC 13MM (MARKER) ×7 IMPLANT
NDL SAFETY ECLIP 18X1.5 (MISCELLANEOUS) ×1 IMPLANT
NS IRRIG 500ML POUR BTL (IV SOLUTION) ×1 IMPLANT
PACK LAMINECTOMY ARMC (PACKS) ×1 IMPLANT
ROD RELINE TI MAS LD 5.5X65 (Rod) IMPLANT
SCREW LOCK RELINE 5.5 TULIP (Screw) IMPLANT
SCREW RELINE RED 6.5X45MM POLY (Screw) IMPLANT
SOLN STERILE WATER BTL 1000 ML (IV SOLUTION) IMPLANT
STAPLER SKIN PROX 35W (STAPLE) IMPLANT
SURGIFLO W/THROMBIN 8M KIT (HEMOSTASIS) ×1 IMPLANT
SUT STRATA 3-0 15 PS-2 (SUTURE) ×1 IMPLANT
SUT VIC AB 0 CT1 27XCR 8 STRN (SUTURE) ×2 IMPLANT
SUT VIC AB 2-0 CT1 18 (SUTURE) ×2 IMPLANT
SUTURE EHLN 3-0 FS-10 30 BLK (SUTURE) IMPLANT
SYR 30ML LL (SYRINGE) ×2 IMPLANT
TOWEL OR 17X26 4PK STRL BLUE (TOWEL DISPOSABLE) ×1 IMPLANT
TRAP FLUID SMOKE EVACUATOR (MISCELLANEOUS) ×1 IMPLANT
TRAY FOLEY SLVR 16FR LF STAT (SET/KITS/TRAYS/PACK) IMPLANT

## 2023-12-25 NOTE — Anesthesia Procedure Notes (Signed)
 Procedure Name: Intubation Date/Time: 12/25/2023 2:13 PM  Performed by: Lorrene Camelia LABOR, CRNAPre-anesthesia Checklist: Patient identified, Emergency Drugs available, Suction available and Patient being monitored Patient Re-evaluated:Patient Re-evaluated prior to induction Oxygen Delivery Method: Circle system utilized Preoxygenation: Pre-oxygenation with 100% oxygen Induction Type: IV induction Ventilation: Mask ventilation without difficulty Laryngoscope Size: 3 and McGrath Grade View: Grade I Tube type: Oral Tube size: 7.0 mm Number of attempts: 1 Airway Equipment and Method: Stylet and Oral airway Placement Confirmation: ETT inserted through vocal cords under direct vision, positive ETCO2 and breath sounds checked- equal and bilateral Secured at: 23 cm Tube secured with: Tape Dental Injury: Teeth and Oropharynx as per pre-operative assessment  Comments: Soft bite block

## 2023-12-25 NOTE — Discharge Instructions (Signed)
 Your surgeon has performed an operation on your lumbar spine (low back) to relieve pressure on one or more nerves. Many times, patients feel better immediately after surgery and can overdo it. Even if you feel well, it is important that you follow these activity guidelines. If you do not let your back heal properly from the surgery, you can increase the chance of hardware complications and/or return of your symptoms. The following are instructions to help in your recovery once you have been discharged from the hospital.  Do not use NSAIDs for 3 months after surgery.  *Regarding compression stockings-  Please wear day and night until you are walking a couple hundred feet three times a day.   Activity    No bending, lifting, or twisting (BLT). Avoid lifting objects heavier than 10 pounds (gallon milk jug).  Where possible, avoid household activities that involve lifting, bending, pushing, or pulling such as laundry, vacuuming, grocery shopping, and childcare. Try to arrange for help from friends and family for these activities while your back heals.  Increase physical activity slowly as tolerated.  Taking short walks is encouraged, but avoid strenuous exercise. Do not jog, run, bicycle, lift weights, or participate in any other exercises unless specifically allowed by your doctor. Avoid prolonged sitting, including car rides.  Talk to your doctor before resuming sexual activity.  You should not drive until cleared by your doctor.  Until released by your doctor, you should not return to work or school.  You should rest at home and let your body heal.   You may shower three days after your surgery.  After showering, lightly dab your incision dry. Do not take a tub bath or go swimming for 3 weeks, or until approved by your doctor at your follow-up appointment.  If you smoke, we strongly recommend that you quit.  Smoking has been proven to interfere with normal healing in your back and will  dramatically reduce the success rate of your surgery. Please contact [facility] (800-QUIT-NOW) and use the resources at www.[facility].com for assistance in stopping smoking.  Surgical Incision   If you have a dressing on your incision, you may remove it three days after your surgery. Keep your incision area clean and dry.  Your incision was closed with Dermabond glue. The glue should begin to peel away within about a week.  Diet            You may return to your usual diet. Be sure to stay hydrated.  When to Contact Us   Although your surgery and recovery will likely be uneventful, you may have some residual numbness, aches, and pains in your back and/or legs. This is normal and should improve in the next few weeks.  However, should you experience any of the following, contact us  immediately: New numbness or weakness Pain that is progressively getting worse, and is not relieved by your pain medications or rest Bleeding, redness, swelling, pain, or drainage from surgical incision Chills or flu-like symptoms Fever greater than 101.0 F (38.3 C) Problems with bowel or bladder functions Difficulty breathing or shortness of breath Warmth, tenderness, or swelling in your calf  Contact Information How to contact us :  If you have any questions/concerns before or after surgery, you can reach us  at [phone], or you can send a mychart message. We can be reached by phone or mychart 8am-4pm, Monday-Friday.  *Please note: Calls after 4pm are forwarded to a third party answering service. Mychart messages are not routinely monitored during evenings,  weekends, and holidays. Please call our office to contact the answering service for urgent concerns during non-business hours.

## 2023-12-25 NOTE — Transfer of Care (Signed)
 Immediate Anesthesia Transfer of Care Note  Patient: Heidi [name]  Procedure(s) Performed: MINIMALLY INVASIVE (MIS) TRANSFORAMINAL LUMBAR INTERBODY FUSION (TLIF) 2 LEVEL (Back) APPLICATION OF INTRAOPERATIVE CT SCAN (Back)  Patient Location: PACU  Anesthesia Type:General  Level of Consciousness: awake, alert , and oriented  Airway & Oxygen Therapy: Patient Spontanous Breathing  Post-op Assessment: Report given to RN and Post -op Vital signs reviewed and stable  Post vital signs: Reviewed and stable  Last Vitals:  Vitals Value Taken Time  BP 133/95 12/25/23 18:37  Temp    Pulse 95 12/25/23 18:41  Resp 22 12/25/23 18:41  SpO2 93 % 12/25/23 18:41  Vitals shown include unfiled device data.  Last Pain:  Vitals:   12/25/23 1113  TempSrc: Temporal  PainSc: 2          Complications: No notable events documented.

## 2023-12-25 NOTE — Interval H&P Note (Signed)
 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  The various methods of treatment have been discussed with the patient and family. After consideration of risks, benefits and other options for treatment, the patient has consented to  Procedures with comments: MINIMALLY INVASIVE (MIS) TRANSFORAMINAL LUMBAR INTERBODY FUSION (TLIF) 2 LEVEL (N/A) - L4-S1 Transforaminal Lumbar Interbody Fusion, Minimally Invasive (MIS) APPLICATION OF INTRAOPERATIVE CT SCAN (N/A) as a surgical intervention.  The patient's history has been reviewed, patient examined, no change in status, stable for surgery.  I have reviewed the patient's chart and labs.  Questions were answered to the patient's satisfaction.    Heart sounds normal no MRG. Chest Clear to Auscultation Bilaterally.   [name]

## 2023-12-25 NOTE — Op Note (Signed)
 Indications: 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 The patient tried and failed conservative management, prompting surgical intervention.  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   Preoperative Note:   Risks of surgery discussed include: infection, bleeding, stroke, coma, death, paralysis, CSF leak, nerve/spinal cord injury, numbness, tingling, weakness, complex regional pain syndrome, recurrent stenosis and/or disc herniation, vascular injury, development of instability, neck/back pain, need for further surgery, persistent symptoms, development of deformity, and the risks of anesthesia. The patient understood these risks and agreed to proceed.  Operative Note:  1. Transforaminal Lumbar Interbody Fusion L4/5 and L5/S1 2. Posterolateral arthrodesis L4 to S1 3. Posterior nonsegmental instrumentation L4 to S1 using Nuvasive Reline 4. Use of stereotaxis ([employer]) 5. Harvesting of autograft via the same incision 6. Placement of a biomechanical device (Globus Sable) at L4/5 and at L5/S1 for anterior arthrodesis  The patient was brought to the Operating Room, intubated and turned into the prone position. All pressure points were checked and double checked. The patient was prepped and draped in the standard fashion. A full timeout was performed. Preoperative antibiotics were given.   After draping, the  stereotactic array was placed.  Stereotactic imaging was acquired and registered to the [employer] system.  The image guidance system was used to choose bilateral [place] incisions. The incisions were injected with local anesthetic.  Each incision was opened with a scalpel, bovie electrocautery was used to open the fascia.  Using stereotactic guidance, each pedicle from L4-S1was cannulated and K wire secured.  We then placed pedicle screws over each K wire on the contralateral side.  We placed the ipsilateral pedicle screws after the TLIF procedure was performed.  6.5x42mm Nuvasive Reline screws were used bilaterally at each level.   After placement of pedicle screws, we then turned our attention to transforaminal lumbar interbody fusion.  A tubular retractor was advanced over the right L5/S1 facet. The microscope was brought into the field.  The right L5/S1 facet was removed with osteotomes and the drill, and handed off for preparation as autograft. The traversing and exiting nerve roots on the right were identified and protected. The disc was opened using a scalpel. After incising the disc space, we took a combination of pituitary rongeurs, Kerrison rongeurs, disc scrapers, and curettes to remove a majority of the disc material.  We prepared the end plates for accepting the interbody fusion.  We removed the cartilaginous plate, preserved the cortical endplate if possible during this procedure.  The disc space was irrigated.  The Globus Sable biomechanical device was inserted, then backfilled with a mixture of allograft and autograft. During placement, the nerve roots and dural sac were carefully protected without any leaks identified. After placement of the device, the canal was fully inspected and all nerve roots were checked to ensure full decompression.  The retractor was removed.  We then moved to L4/5. A tubular retractor was advanced over the right L4/5 facet. The microscope was brought into the field.   The right L4/5 facet was  removed with osteotomes and the drill, and handed off for preparation as autograft. The traversing and exiting nerve roots on the right were identified and protected. The disc was opened using a scalpel. After incising the disc space, we took a combination of pituitary rongeurs, Kerrison rongeurs, disc scrapers, and curettes to remove a majority of the disc material.  We prepared the end plates for accepting the interbody fusion.  We removed the cartilaginous plate, preserved the cortical endplate if possible during this procedure.  The disc space was irrigated.  The Globus Sable biomechanical device was inserted, then backfilled with a mixture of allograft and autograft. During placement, the nerve roots and dural sac were carefully protected without any leaks identified. After placement of the device, the canal was fully inspected and all nerve roots were checked to ensure full decompression.  The retractor was removed.  Rods measured and placed. The rods were secured using locking caps to manufacturer's specifications. CT scan was taken to confirm instrumentation placement. The wound was copiously irrigated, then the posterior elements were prepared for arthrodesis.  A mixture of allograft and autograft was placed over the decorticated surfaces for arthrodesis.   A subfascial drain was placed on the right.  After hemostasis, the wound was closed in layers with 0 and 2-0 vicryl. 3-0 monocryl and dermabond was applied to the incision. A sterile dressing was placed.  The patient was then flipped supine and extubated with incident. All counts were correct times 2 at the end of the case. No immediate complications were noted.  Edsel Goods PA assisted in the procedure. An assistant was required for this procedure due to the complexity.  The assistant provided assistance in tissue manipulation and suction, and was required for the successful and safe performance of the procedure.  I performed the critical portions of the procedure.   Reeves Daisy MD

## 2023-12-25 NOTE — Anesthesia Preprocedure Evaluation (Signed)
 Anesthesia Evaluation  Patient identified by MRN, date of birth, ID band Patient awake    Reviewed: Allergy  & Precautions, H&P , NPO status , Patient's Chart, lab work & pertinent test results  Airway Mallampati: II  TM Distance: >3 FB Neck ROM: Full    Dental no notable dental hx. (+) Chipped Has a cap somewhere in right lower jaw:   Pulmonary neg pulmonary ROS, asthma , former smoker   Pulmonary exam normal breath sounds clear to auscultation       Cardiovascular Normal cardiovascular exam Rhythm:Regular Rate:Normal     Neuro/Psych  PSYCHIATRIC DISORDERS Anxiety Depression    negative neurological ROS  negative psych ROS   GI/Hepatic negative GI ROS, Neg liver ROS,,,  Endo/Other  negative endocrine ROS    Renal/GU      Musculoskeletal   Abdominal   Peds  Hematology negative hematology ROS (+)   Anesthesia Other Findings Past Medical History: No date: Anxiety No date: Asthma No date: Body piercing     Comment:  Tragus piercing.  Pt reports CANNOT be removed No date: Clotting disorder     Comment:  DVT No date: Depression 2004: DVT (deep venous thrombosis) (HCC) No date: Family history of colon cancer in mother No date: Family history of ovarian cancer     Comment:  4/21 cancer genetic tesitng letter sent No date: Herpes genitalis No date: Lupus No date: MVP (mitral valve prolapse)     Comment:  Dx'd many years ago. No cardiologist. No date: Neck stiffness     Comment:  bulging discs No date: Restless leg syndrome No date: Wears contact lenses  Past Surgical History: No date: ABLATION 2011: COLONOSCOPY 03/24/2023: COLONOSCOPY WITH PROPOFOL ; N/A     Comment:  Procedure: COLONOSCOPY WITH PROPOFOL ;  Surgeon: Unk Corinn Skiff, MD;  Location: [facility];                Service: Endoscopy;  Laterality: N/A; No date: ENDOMETRIAL BIOPSY 03/10/2021: ESOPHAGOGASTRODUODENOSCOPY (EGD)  WITH PROPOFOL ; N/A     Comment:  Procedure: ESOPHAGOGASTRODUODENOSCOPY (EGD) WITH               PROPOFOL ;  Surgeon: Unk Corinn Skiff, MD;  Location:               [facility];  Service: Gastroenterology;  Laterality:               N/A; No date: NASAL SEPTUM SURGERY 1995: NOSE SURGERY     Comment:  deviated nose septum 03/24/2023: POLYPECTOMY     Comment:  Procedure: POLYPECTOMY;  Surgeon: Unk Corinn Skiff,               MD;  Location: [facility];  Service: Endoscopy;; 1995: TONSILLECTOMY 2009: TUBAL LIGATION     Reproductive/Obstetrics negative OB ROS                              Anesthesia Physical Anesthesia Plan  ASA: 2  Anesthesia Plan: General ETT   Post-op Pain Management:    Induction: Intravenous  PONV Risk Score and Plan: 3 and Ondansetron , Dexamethasone  and Midazolam   Airway Management Planned: Oral ETT  Additional Equipment:   Intra-op Plan:   Post-operative Plan: Extubation in OR  Informed Consent: I have reviewed the patients History and Physical, chart, labs and discussed the procedure including the risks,  benefits and alternatives for the proposed anesthesia with the patient or authorized representative who has indicated his/her understanding and acceptance.     Dental Advisory Given  Plan Discussed with: Anesthesiologist, CRNA and Surgeon  Anesthesia Plan Comments: (Patient consented for risks of anesthesia including but not limited to:  - adverse reactions to medications - damage to eyes, teeth, lips or other oral mucosa - nerve damage due to positioning  - sore throat or hoarseness - Damage to heart, brain, nerves, lungs, other parts of body or loss of life  Patient voiced understanding and assent.)        Anesthesia Quick Evaluation

## 2023-12-26 ENCOUNTER — Encounter: Payer: Self-pay | Admitting: Neurosurgery

## 2023-12-26 MED ORDER — SODIUM CHLORIDE 0.9 % IV SOLN: Freq: Once | INTRAVENOUS | Status: AC

## 2023-12-26 MED ORDER — BUTALBITAL-APAP-CAFFEINE 50-325-40 MG PO TABS
1.0000 | ORAL_TABLET | ORAL | Status: DC | PRN
  Administered 2023-12-26 – 2023-12-27 (×2): 1 via ORAL
  Filled 2023-12-26 (×2): qty 1

## 2023-12-26 MED ORDER — CELECOXIB 200 MG PO CAPS
200.0000 mg | ORAL_CAPSULE | Freq: Two times a day (BID) | ORAL | Status: DC
  Administered 2023-12-27 – 2023-12-29 (×5): 200 mg via ORAL
  Filled 2023-12-26 (×5): qty 1

## 2023-12-26 NOTE — Plan of Care (Signed)
°  Problem: Education: Goal: Knowledge of General Education information will improve Description: Including pain rating scale, medication(s)/side effects and non-pharmacologic comfort measures Outcome: Progressing   Problem: Activity: Goal: Risk for activity intolerance will decrease Outcome: Progressing   Problem: Pain Managment: Goal: General experience of comfort will improve and/or be controlled Outcome: Progressing   Problem: Education: Goal: Ability to verbalize activity precautions or restrictions will improve Outcome: Progressing   Problem: Activity: Goal: Ability to avoid complications of mobility impairment will improve Outcome: Progressing

## 2023-12-26 NOTE — TOC Initial Note (Addendum)
 Transition of Care [facility]) - Initial/Assessment Note    Patient Details  Name: Heidi [name] MRN: [id number] Date of Birth: 11-Jan-1975  [facility] CM/SW Contact:    Alvaro Louder, LCSW Phone Number: 12/26/2023, 12:16 PM  Clinical Narrative:    LCSWA discussed OT recommendation of HH Patient was agreeable. LCSWA reached out to [facility] admissions coordinator an started service for patient. LCSWA discussed PT recommendation of RW and BSC 3in1. Patient was agreeable, LCSWA reached out to [employer] coordinator to set up equipment delivery.     TOC to follow for discharge.                   Patient Goals and CMS Choice            Expected Discharge Plan and Services                                              Prior Living Arrangements/Services                       Activities of Daily Living   ADL Screening (condition at time of admission) Independently performs ADLs?: Yes (appropriate for developmental age) Is the patient deaf or have difficulty hearing?: No Does the patient have difficulty seeing, even when wearing glasses/contacts?: No Does the patient have difficulty concentrating, remembering, or making decisions?: No  Permission Sought/Granted                  Emotional Assessment              Admission diagnosis:  Spinal instability, lumbar [M53.2X6] Spondylolisthesis of lumbar region [M43.16] Synovial cyst of lumbar spine [M71.38] Chronic bilateral low back pain with bilateral sciatica [M54.42, M54.41, G89.29] S/P lumbar fusion [Z98.1] Patient Active Problem List   Diagnosis Date Noted   S/P lumbar fusion 12/25/2023   Spondylolisthesis of lumbar region 12/25/2023   Synovial cyst of lumbar spine 12/25/2023   Chronic bilateral low back pain with bilateral sciatica 12/25/2023   Spinal instability, lumbar 12/25/2023   Family history of colon cancer in mother 03/24/2023   Polyp of ascending colon 03/24/2023    Mild intermittent asthma with acute exacerbation 02/24/2021   Generalized abdominal pain 02/24/2021   PTSD (post-traumatic stress disorder) 02/24/2021   B12 deficiency 04/08/2020   History of anemia 12/03/2014   History of asthma 12/03/2014   History of DVT (deep vein thrombosis) 12/03/2014   H/O: depression 12/03/2014   Lupus anticoagulant disorder 12/03/2014   History of migraine headaches 12/03/2014   H/O cardiovascular disorder 12/03/2014   Restless leg syndrome 12/03/2014   Disorder of connective tissue 10/03/2013   PCP:  Gasper Nancyann BRAVO, MD Pharmacy:   [facility] [phone] [address], [place] - [address] [place] [address] Phone: [phone] Fax: [phone]  [facility] [address] [place] [address] Phone: [phone] Fax: [phone]     Social Drivers of Health (SDOH) Social History: SDOH Screenings   Food Insecurity: No Food Insecurity (12/25/2023)  Housing: Low Risk (12/25/2023)  Transportation Needs: No Transportation Needs (12/25/2023)  Utilities: Not At Risk (12/25/2023)  Alcohol Screen: Low Risk (05/08/2022)  Depression (PHQ2-9): Medium Risk (01/16/2023)  Financial Resource Strain: Low Risk (12/17/2023)  Physical Activity: Inactive (12/17/2023)  Social Connections: Socially Isolated (12/17/2023)  Stress: Stress Concern Present (12/17/2023)  Tobacco Use: Medium Risk (12/25/2023)   SDOH Interventions:     Readmission Risk Interventions     No data to display

## 2023-12-26 NOTE — Progress Notes (Signed)
 Physical Therapy Treatment Patient Details Name: Heidi [name] MRN: [id number] DOB: 10/26/1975 Today's Date: 12/26/2023   History of Present Illness Pt is a 48 y/o F admitted on 12/25/23 for scheduled L4-S1 TLIF. PMH: anxiety, asthma, depression, clotting disorder, lupus, mitral valve prolapse, RLS    PT Comments  Pt seen for PT tx with pt agreeable, fiance present for session. Adjusted LSO for improved fit & assisted pt with donning LSO. Pt completed bed mobility without assistance, good return demo of log rolling. Pt is able to ambulate in room, bathroom, & hallway with RW & CGA, no c/o adverse symptoms during session. Pt is progressing well with mobility. Will continue to follow pt acutely to progress gait with LRAD & for stair negotiation prior to d/c home.    If plan is discharge home, recommend the following: A little help with walking and/or transfers;A little help with bathing/dressing/bathroom;Assistance with cooking/housework;Assist for transportation;Help with stairs or ramp for entrance   Can travel by private vehicle        Equipment Recommendations  Rolling walker (2 wheels);BSC/3in1    Recommendations for Other Services       Precautions / Restrictions Precautions Precautions: Fall;Back Precaution/Restrictions Comments: watch BP Required Braces or Orthoses: Spinal Brace Spinal Brace: Lumbar corset;Applied in sitting position Restrictions Weight Bearing Restrictions Per Provider Order: No     Mobility  Bed Mobility Overal bed mobility: Needs Assistance Bed Mobility: Rolling, Sidelying to Sit Rolling: Modified independent (Device/Increase time) (rolled R>L) Sidelying to sit: Modified independent (Device/Increase time), HOB elevated, Used rails (exit L side of bed)            Transfers Overall transfer level: Needs assistance Equipment used: Rolling walker (2 wheels) Transfers: Sit to/from Stand Sit to Stand: Contact guard assist, Supervision   Step  pivot transfers: Min assist (bed>BSC on R)       General transfer comment: ongoing cuing re: hand placement to push to standing, able to transfer sit>stand from EOB, low toilet with grab bar    Ambulation/Gait Ambulation/Gait assistance: Contact guard assist Gait Distance (Feet): 6 Feet (+ 75 ft + 5 ft) Assistive device: Rolling walker (2 wheels), None Gait Pattern/deviations: Decreased step length - right, Decreased step length - left, Decreased stride length Gait velocity: decreased     General Gait Details: Pt ambulates in room, bathroom, hallway with RW & CGA. Pt does ambulate ~5 ft without AD with CGA.   Stairs             Wheelchair Mobility     Tilt Bed    Modified Rankin (Stroke Patients Only)       Balance Overall balance assessment: Needs assistance Sitting-balance support: Feet supported Sitting balance-Leahy Scale: Good Sitting balance - Comments: toileting without LOB   Standing balance support: Bilateral upper extremity supported, During functional activity, Reliant on assistive device for balance Standing balance-Leahy Scale: Fair                              Hotel Manager: No apparent difficulties  Cognition Arousal: Alert Behavior During Therapy: WFL for tasks assessed/performed   PT - Cognitive impairments: No apparent impairments                         Following commands: Intact      Cueing Cueing Techniques: Verbal cues  Exercises      General Comments General comments (skin integrity,  edema, etc.): continent void, educated pt on & assisted her with donning TLSO, reviewed back precautions      Pertinent Vitals/Pain Pain Assessment Pain Assessment: Faces Faces Pain Scale: Hurts a little bit Pain Location: R hip, back Pain Descriptors / Indicators: Sore, Discomfort Pain Intervention(s): Monitored during session    Home Living Family/patient expects to be discharged to::  Private residence Living Arrangements: Alone Available Help at Discharge: Family;Available 24 hours/day (fiance, ex-husband, mother can provide 24 hr assist) Type of Home: House Home Access: Stairs to enter Entrance Stairs-Rails: None Entrance Stairs-Number of Steps: 2   Home Layout: One level Home Equipment: Shower seat;Adaptive equipment      Prior Function            PT Goals (current goals can now be found in the care plan section) Acute Rehab PT Goals Patient Stated Goal: get better, recover s/p sx PT Goal Formulation: With patient Time For Goal Achievement: 01/09/24 Potential to Achieve Goals: Good Progress towards PT goals: Progressing toward goals    Frequency    7X/week      PT Plan      Co-evaluation              AM-PAC PT 6 Clicks Mobility   Outcome Measure  Help needed turning from your back to your side while in a flat bed without using bedrails?: None Help needed moving from lying on your back to sitting on the side of a flat bed without using bedrails?: A Little Help needed moving to and from a bed to a chair (including a wheelchair)?: A Little Help needed standing up from a chair using your arms (e.g., wheelchair or bedside chair)?: A Little Help needed to walk in hospital room?: A Little Help needed climbing 3-5 steps with a railing? : A Little 6 Click Score: 19    End of Session   Activity Tolerance: Patient tolerated treatment well Patient left: in chair;with chair alarm set;with call bell/phone within reach Nurse Communication: Mobility status PT Visit Diagnosis: Muscle weakness (generalized) (M62.81);Difficulty in walking, not elsewhere classified (R26.2);Other abnormalities of gait and mobility (R26.89);Unsteadiness on feet (R26.81);Pain Pain - Right/Left: Right Pain - part of body:  (back)     Time: 8562-8498 PT Time Calculation (min) (ACUTE ONLY): 24 min  Charges:    $Therapeutic Activity: 23-37 mins PT General  Charges $$ ACUTE PT VISIT: 1 Visit                     Richerd Pinal, PT, DPT 12/26/2023, 3:06 PM    Richerd CHRISTELLA Pinal 12/26/2023, 3:05 PM

## 2023-12-26 NOTE — Progress Notes (Signed)
 Occupational Therapy Treatment Patient Details Name: Heidi [name] MRN: [id number] DOB: 1975/03/18 Today's Date: 12/26/2023   History of present illness Pt is a 48 y/o F admitted on 12/25/23 for scheduled L4-S1 TLIF. PMH: anxiety, asthma, depression, clotting disorder, lupus, mitral valve prolapse, RLS   OT comments  Pt seen for OT tx. Pt in bed, lights out, side lying in bed. Pt endorses increased pain from sitting up in the recliner earlier. Fiance present and supportive. Pt/fiance educated further in home/routines modifications for ADL, IADL, mobility, and AE/DME to optimize adherence to precautions and safety. Pt/fiance verbalized understanding and denied additional questions/concerns. Progressing towards goals.       If plan is discharge home, recommend the following:  A little help with walking and/or transfers;A little help with bathing/dressing/bathroom;Assistance with cooking/housework;Assist for transportation;Help with stairs or ramp for entrance   Equipment Recommendations  BSC/3in1;Other (comment) (2WW)    Recommendations for Other Services      Precautions / Restrictions Precautions Precautions: Fall;Back Precaution Booklet Issued: Yes (comment) Recall of Precautions/Restrictions: Intact Precaution/Restrictions Comments: watch BP Required Braces or Orthoses: Spinal Brace Spinal Brace: Lumbar corset;Applied in sitting position Restrictions Weight Bearing Restrictions Per Provider Order: No       Mobility Bed Mobility Overal bed mobility: Needs Assistance Bed Mobility: Sidelying to Sit, Sit to Sidelying   Sidelying to sit: Supervision     Sit to sidelying: Supervision General bed mobility comments: pt sidelying, recently back to bed    Transfers                   General transfer comment: deferred 2/2 dizziness and drop in BP     Balance Overall balance assessment: Needs assistance Sitting-balance support: Feet supported, Bilateral upper  extremity supported Sitting balance-Leahy Scale: Fair       ADL either performed or assessed with clinical judgement    Communication Communication Communication: No apparent difficulties   Cognition Arousal: Alert Behavior During Therapy: WFL for tasks assessed/performed Cognition: No apparent impairments            Cueing   Cueing Techniques: Verbal cues  Exercises Other Exercises Other Exercises: Pt edu in back precautions and how to maintain during ADL/mobility, AE/DME for LB ADL tasks, and falls prevention. Handout provided. Other Exercises: Pt/fiance educated further in home/routines modifications for ADL, IADL, mobility, and AE/DME to optimize adherence to precautions and safety.    Shoulder Instructions       General Comments continent void, educated pt on & assisted her with donning TLSO, reviewed back precautions    Pertinent Vitals/ Pain       Pain Assessment Pain Assessment: Faces Faces Pain Scale: Hurts even more Pain Location: R hip, back Pain Descriptors / Indicators: Sore, Discomfort Pain Intervention(s): Monitored during session, Premedicated before session   Frequency  Min 2X/week        Progress Toward Goals  OT Goals(current goals can now be found in the care plan section)  Progress towards OT goals: Progressing toward goals  Acute Rehab OT Goals Patient Stated Goal: get better and return to working OT Goal Formulation: With patient Time For Goal Achievement: 01/09/24 Potential to Achieve Goals: Good   AM-PAC OT 6 Clicks Daily Activity     Outcome Measure   Help from another person eating meals?: None Help from another person taking care of personal grooming?: None Help from another person toileting, which includes using toliet, bedpan, or urinal?: A Little Help from another person bathing (including washing, rinsing,  drying)?: A Little Help from another person to put on and taking off regular upper body clothing?: None Help  from another person to put on and taking off regular lower body clothing?: A Little 6 Click Score: 21    End of Session    OT Visit Diagnosis: Other abnormalities of gait and mobility (R26.89)   Activity Tolerance Patient tolerated treatment well   Patient Left in bed;with call bell/phone within reach;with family/visitor present   Nurse Communication          Time: 8382-8360 OT Time Calculation (min): 22 min  Charges: OT General Charges $OT Visit: 1 Visit OT Treatments $Self Care/Home Management : 8-22 mins  Warren SAUNDERS., MPH, MS, OTR/L ascom [phone] 12/26/2023, 4:49 PM

## 2023-12-26 NOTE — Evaluation (Signed)
 Occupational Therapy Evaluation Patient Details Name: Heidi [name] MRN: [id number] DOB: 1975-08-25 Today's Date: 12/26/2023   History of Present Illness   Pt is a 48 y/o F admitted on 12/25/23 for scheduled L4-S1 TLIF. PMH: anxiety, asthma, depression, clotting disorder, lupus, mitral valve prolapse, RLS     Clinical Impressions Pt was seen for OT evaluation this date, POD1. Prior to hospital admission, pt was independent and working, however increasingly limited by back pain and RLE pain. Pt lives alone but reports her fiance and ex-husband will both be able to assist as needed at discharge. Pt presents with deficits in BLE strength, headache, back pain, and dizziness and nausea with positional changes limiting their ability to perform ADL management at baseline level. On 2nd sup>sit EOB, pt only able to tolerate sitting long enough for BP to read. Orthostatic+ and symptomatic requiring return to side lying. Anticipate pt will require MIN A For LB ADL tasks once medically optimized and MIN A for back brace. Pt edu in back precautions and how to maintain during ADL/mobility, AE/DME for LB ADL tasks, and falls prevention. Handout provided. Pt would benefit from skilled OT services to address noted impairments and functional limitations (see below for any additional details) in order to maximize safety and independence while minimizing future risk of falls, injury, and readmission. Anticipate the need for follow up OT services upon acute hospital DC.    If plan is discharge home, recommend the following:   A little help with walking and/or transfers;A little help with bathing/dressing/bathroom;Assistance with cooking/housework;Assist for transportation;Help with stairs or ramp for entrance     Functional Status Assessment   Patient has had a recent decline in their functional status and demonstrates the ability to make significant improvements in function in a reasonable and predictable  amount of time.     Equipment Recommendations   BSC/3in1;Other (comment) (2WW)     Recommendations for Other Services         Precautions/Restrictions   Precautions Precautions: Fall;Back Precaution Booklet Issued: Yes (comment) Recall of Precautions/Restrictions: Intact Precaution/Restrictions Comments: watch BP Required Braces or Orthoses: Spinal Brace Spinal Brace: Lumbar corset;Applied in sitting position Restrictions Weight Bearing Restrictions Per Provider Order: No     Mobility Bed Mobility Overal bed mobility: Needs Assistance Bed Mobility: Sidelying to Sit, Sit to Sidelying   Sidelying to sit: Supervision     Sit to sidelying: Supervision General bed mobility comments: Pt edu log rolling and able to return demo    Transfers                   General transfer comment: deferred 2/2 dizziness and drop in BP      Balance Overall balance assessment: Needs assistance Sitting-balance support: Feet supported, Bilateral upper extremity supported Sitting balance-Leahy Scale: Fair                                     ADL either performed or assessed with clinical judgement   ADL Overall ADL's : Needs assistance/impaired                                       General ADL Comments: Pt limited by dizziness with positional changes (orthostatic) for functional ADL assessment. Anticipate pt will require MIN A for LB ADL tasks in order to maintain back precautions  and MIN A for back brace.      Pertinent Vitals/Pain Pain Assessment Pain Assessment: Faces Faces Pain Scale: Hurts little more Pain Location: back, headache behind her eyes Pain Descriptors / Indicators: Aching, Headache Pain Intervention(s): Limited activity within patient's tolerance, Monitored during session, Repositioned, Premedicated before session     Extremity/Trunk Assessment Upper Extremity Assessment Upper Extremity Assessment: Overall WFL for  tasks assessed   Lower Extremity Assessment Lower Extremity Assessment: Generalized weakness;Defer to PT evaluation   Cervical / Trunk Assessment Cervical / Trunk Assessment: Back Surgery   Communication Communication Communication: No apparent difficulties   Cognition Arousal: Alert Behavior During Therapy: WFL for tasks assessed/performed Cognition: No apparent impairments                                       Cueing  General Comments   Cueing Techniques: Verbal cues  Initial sit EOB pt endorsed dizziness requiring her to return to sidelying. Supine BP 111/69 HR 69, 2nd attempt sitting BP 88/60 HR 81 and endorsing dizziness requiring return to sideling as soon as BP was completed   Exercises Other Exercises Other Exercises: Pt edu in back precautions and how to maintain during ADL/mobility, AE/DME for LB ADL tasks, and falls prevention. Handout provided.   Shoulder Instructions      Home Living Family/patient expects to be discharged to:: Private residence Living Arrangements: Alone Available Help at Discharge: Family;Available 24 hours/day (fiance, ex-husband, mother can provide 24 hr assist) Type of Home: House Home Access: Stairs to enter [employer] of Steps: 2 Entrance Stairs-Rails: None Home Layout: One level     Bathroom Shower/Tub: Tub/shower unit;Walk-in Human Resources Officer: Standard     Home Equipment: Shower seat;Adaptive equipment Adaptive Equipment: Reacher        Prior Functioning/Environment Prior Level of Function : Independent/Modified Independent;Working/employed;Driving             Mobility Comments: Ambulatory without AD, independent, working      OT Problem List: Decreased strength;Pain;Cardiopulmonary status limiting activity;Decreased knowledge of use of DME or AE;Impaired balance (sitting and/or standing);Impaired UE functional use;Decreased knowledge of precautions   OT Treatment/Interventions:  Self-care/ADL training;Therapeutic exercise;Therapeutic activities;DME and/or AE instruction;Patient/family education;Balance training      OT Goals(Current goals can be found in the care plan section)   Acute Rehab OT Goals Patient Stated Goal: get better and return to working OT Goal Formulation: With patient Time For Goal Achievement: 01/09/24 Potential to Achieve Goals: Good ADL Goals Pt Will Perform Lower Body Dressing: with modified independence;sit to/from stand;with adaptive equipment;sitting/lateral leans (maintain back precautions) Pt Will Transfer to Toilet: with modified independence;ambulating (LRAD) Pt Will Perform Toileting - Clothing Manipulation and hygiene: with modified independence Additional ADL Goal #1: Pt will verbalize back precautions and how to maintain during ADL, mobility independently/ Additional ADL Goal #2: Pt will demo mod indep with back brace mgt.   OT Frequency:  Min 2X/week    Co-evaluation              AM-PAC OT 6 Clicks Daily Activity     Outcome Measure Help from another person eating meals?: None Help from another person taking care of personal grooming?: None Help from another person toileting, which includes using toliet, bedpan, or urinal?: A Little Help from another person bathing (including washing, rinsing, drying)?: A Little Help from another person to put on and taking off regular  upper body clothing?: None Help from another person to put on and taking off regular lower body clothing?: A Little 6 Click Score: 21   End of Session Nurse Communication: Mobility status;Other (comment) (headache, nausea)  Activity Tolerance: Treatment limited secondary to medical complications (Comment) (dizzy) Patient left: in bed;with call bell/phone within reach;with bed alarm set  OT Visit Diagnosis: Other abnormalities of gait and mobility (R26.89)                Time: 9097-9065 OT Time Calculation (min): 32 min Charges:  OT General  Charges $OT Visit: 1 Visit OT Evaluation $OT Eval Low Complexity: 1 Low OT Treatments $Self Care/Home Management : 8-22 mins  Warren SAUNDERS., MPH, MS, OTR/L ascom [phone] 12/26/2023, 11:51 AM

## 2023-12-26 NOTE — TOC CM/SW Note (Addendum)
 Transition of Care (TOC) CM/SW Note   .Patient is not able to walk the distance required to go the bathroom, or he/she is unable to safely negotiate stairs required to access the bathroom.  A 3in1 BSC will alleviate this problem

## 2023-12-26 NOTE — Progress Notes (Signed)
 Neurosurgery Progress Note  History: Heidi [name] is here for spondylolisthesis and low back pain.  POD1: Some lightheadedness.  Pain under reasonable control.  Physical Exam: Vitals:   12/26/23 0342 12/26/23 0757  BP: 120/77 103/64  Pulse: 64 71  Resp: 18 18  Temp: 97.7 F (36.5 C) 98 F (36.7 C)  SpO2: 98% 100%    AA Ox3 CNI  Strength:5/5 throughout BLE  Dressing c/d/I  Data:  Other tests/results: drain 105  Assessment/Plan:  Heidi [name] is doing well after L4-S1 MIS TLIF.  - mobilize - pain control - DVT prophylaxis - PTOT - Monitor drain output   Reeves Daisy MD, [facility] Department of Neurosurgery

## 2023-12-26 NOTE — Progress Notes (Signed)
°   12/26/23 1430  Spiritual Encounters  Type of Visit Initial  Care provided to: Patient  Conversation partners present during encounter Other (comment) (Friend)  Referral source Clinical staff  Reason for visit Advance directives  OnCall Visit Yes  Advance Directives (For Healthcare)  Does Patient Have a Medical Advance Directive? No  Would patient like information on creating a medical advance directive? Yes (Inpatient - patient defers creating a medical advance directive at this time - Information given)   Chaplain delivered and explained Advance Directive paperwork and process.  Patient will complete and notify nurse to call for chaplain assistance to have the signature notarized.

## 2023-12-26 NOTE — Evaluation (Signed)
 Physical Therapy Evaluation Patient Details Name: Heidi [name] MRN: [id number] DOB: 13-Dec-1975 Today's Date: 12/26/2023  History of Present Illness  Pt is a 48 y/o F admitted on 12/25/23 for scheduled L4-S1 TLIF. PMH: anxiety, asthma, depression, clotting disorder, lupus, mitral valve prolapse, RLS  Clinical Impression  Pt seen for PT evaluation with pt agreeable. Pt reports prior to admission she was independent, working. On this date, PT educates pt on back precautions, use of LSO. Pt reports feeling better, eager to attempt to void. Pt is able o sit EOB x ~3 minutes with supervision, nurse entering room, reporting pt looks much better. Pt assisted to [facility] when pt reports she feels like she's going to pass out. Elevated pt's BLE & leaned her posteriorly until +2 assist returned & pt assisted back to bed. Pt communicating with PT throughout entirety of session, reports she feels better once back in bed. Pt noted to have BP drop from supine>semi fowler & nurse & MD aware. Encouraged pt to sit upright as much as possible & drink water . Will continue to follow pt acutely to progress mobility as able.      If plan is discharge home, recommend the following: A little help with walking and/or transfers;A little help with bathing/dressing/bathroom;Assistance with cooking/housework;Assist for transportation;Help with stairs or ramp for entrance   Can travel by private vehicle        Equipment Recommendations Rolling walker (2 wheels);BSC/3in1  Recommendations for Other Services       Functional Status Assessment Patient has had a recent decline in their functional status and demonstrates the ability to make significant improvements in function in a reasonable and predictable amount of time.     Precautions / Restrictions Precautions Precautions: Fall;Back Precaution/Restrictions Comments: watch BP Required Braces or Orthoses: Spinal Brace Spinal Brace: Lumbar corset;Applied in sitting  position Restrictions Weight Bearing Restrictions Per Provider Order: No      Mobility  Bed Mobility Overal bed mobility: Needs Assistance Bed Mobility: Sidelying to Sit   Sidelying to sit: Supervision (L sidelying>sitting EOB with supervision, cuing/education re: log rolling)            Transfers Overall transfer level: Needs assistance Equipment used: 1 person hand held assist Transfers: Sit to/from Stand, Bed to chair/wheelchair/BSC Sit to Stand: Min assist   Step pivot transfers: Min assist (bed>BSC on R)            Ambulation/Gait                  Stairs            Wheelchair Mobility     Tilt Bed    Modified Rankin (Stroke Patients Only)       Balance Overall balance assessment: Needs assistance Sitting-balance support: Feet supported, Bilateral upper extremity supported Sitting balance-Leahy Scale: Fair Sitting balance - Comments: supervision static sitting EOB   Standing balance support: Single extremity supported, During functional activity Standing balance-Leahy Scale: Fair                               Pertinent Vitals/Pain Pain Assessment Pain Assessment: Faces Faces Pain Scale: Hurts little more Pain Location: R hip Pain Descriptors / Indicators: Discomfort Pain Intervention(s): Monitored during session, Repositioned    Home Living Family/patient expects to be discharged to:: Private residence Living Arrangements: Alone Available Help at Discharge: Family;Available 24 hours/day (fiance, ex-husband, mother can provide 24 hr assist) Type of Home: House  Home Access: Stairs to enter Entrance Stairs-Rails: None Entrance Stairs-Number of Steps: 2   Home Layout: One level Home Equipment: Shower seat;Adaptive equipment      Prior Function Prior Level of Function : Independent/Modified Independent;Working/employed;Driving             Mobility Comments: Ambulatory without AD, independent, working        Extremity/Trunk Assessment   Upper Extremity Assessment Upper Extremity Assessment: Overall WFL for tasks assessed    Lower Extremity Assessment Lower Extremity Assessment: Generalized weakness (limited 2/2 increased back pain & posterior BLE pain with movement)    Cervical / Trunk Assessment Cervical / Trunk Assessment: Back Surgery  Communication   Communication Communication: No apparent difficulties    Cognition Arousal: Alert Behavior During Therapy: WFL for tasks assessed/performed   PT - Cognitive impairments: No apparent impairments                         Following commands: Intact       Cueing Cueing Techniques: Verbal cues     General Comments      Exercises     Assessment/Plan    PT Assessment Patient needs continued PT services  PT Problem List Decreased strength;Pain;Decreased range of motion;Decreased activity tolerance;Decreased knowledge of use of DME;Decreased balance;Decreased safety awareness;Decreased mobility;Decreased knowledge of precautions       PT Treatment Interventions Therapeutic exercise;DME instruction;Gait training;Balance training;Stair training;Neuromuscular re-education;Functional mobility training;Therapeutic activities;Patient/family education;Modalities    PT Goals (Current goals can be found in the Care Plan section)  Acute Rehab PT Goals Patient Stated Goal: get better, recover s/p sx PT Goal Formulation: With patient Time For Goal Achievement: 01/09/24 Potential to Achieve Goals: Good    Frequency 7X/week     Co-evaluation               AM-PAC PT 6 Clicks Mobility  Outcome Measure Help needed turning from your back to your side while in a flat bed without using bedrails?: None Help needed moving from lying on your back to sitting on the side of a flat bed without using bedrails?: A Little Help needed moving to and from a bed to a chair (including a wheelchair)?: A Little Help needed standing  up from a chair using your arms (e.g., wheelchair or bedside chair)?: A Little Help needed to walk in hospital room?: Total Help needed climbing 3-5 steps with a railing? : Total 6 Click Score: 15    End of Session   Activity Tolerance: Treatment limited secondary to medical complications (Comment) Patient left: in bed;with bed alarm set;with call bell/phone within reach (HOB elevated to promote upright sitting tolerance) Nurse Communication: Mobility status (notified nurse & MD of events of session, ongoing low BP with mobility) PT Visit Diagnosis: Muscle weakness (generalized) (M62.81);Difficulty in walking, not elsewhere classified (R26.2);Other abnormalities of gait and mobility (R26.89);Unsteadiness on feet (R26.81);Pain Pain - Right/Left: Right Pain - part of body: Hip (back, R hip)    Time: 8966-8897 PT Time Calculation (min) (ACUTE ONLY): 29 min   Charges:   PT Evaluation $PT Eval Low Complexity: 1 Low PT Treatments $Therapeutic Activity: 8-22 mins PT General Charges $$ ACUTE PT VISIT: 1 Visit         Richerd Pinal, PT, DPT 12/26/2023, 11:34 AM   Richerd CHRISTELLA Pinal 12/26/2023, 11:31 AM

## 2023-12-26 NOTE — TOC CM/SW Note (Signed)
 Transition of Care [facility]) CM/SW Note   Transition of Care [facility]) - Inpatient Brief Assessment   Patient Details  Name: Tanisha Lutes MRN: [id number] Date of Birth: [birth year]  Transition of Care [facility]) CM/SW Contact:    Alvaro Louder, LCSW Phone Number: 12/26/2023, 9:08 AM   Clinical Narrative:  Per chart review TOC consulted for SNF Placement/ Home health/DME. LCSWA to follow recommendations placed by PT and OT.  TOC to follow for discharge   Transition of Care Asessment: Insurance and Status: Insurance coverage has been reviewed Patient has primary care physician: Yes Home environment has been reviewed: Single Family Home Prior level of function:: Ox4 Prior/Current Home Services: No current home services Social Drivers of Health Review: SDOH reviewed no interventions necessary Readmission risk has been reviewed: Yes Transition of care needs: no transition of care needs at this time

## 2023-12-26 NOTE — Progress Notes (Signed)
°  Chaplain On-Call responded to Spiritual Care Consult Order from Reeves Daisy, MD.  The request was for Advance Directives information for the patient.  The patient was unavailable to visit while receiving PT.  Chaplain will refer to Afternoon Chaplain for follow-up.  Chaplain Bebe Ardean EMERSON Hershal., [facility]

## 2023-12-27 MED ORDER — TIZANIDINE HCL 4 MG PO TABS: 4.0000 mg | ORAL_TABLET | Freq: Three times a day (TID) | ORAL | Status: DC | PRN

## 2023-12-27 MED ORDER — TIZANIDINE HCL 4 MG PO TABS
4.0000 mg | ORAL_TABLET | Freq: Three times a day (TID) | ORAL | Status: DC | PRN
  Administered 2023-12-28 (×2): 4 mg via ORAL
  Filled 2023-12-27 (×2): qty 1

## 2023-12-27 MED ORDER — SODIUM CHLORIDE 0.9 % IV BOLUS
500.0000 mL | Freq: Once | INTRAVENOUS | Status: AC
  Administered 2023-12-27: 20:00:00 500 mL via INTRAVENOUS

## 2023-12-27 MED ORDER — SODIUM CHLORIDE 0.9 % IV SOLN: INTRAVENOUS | Status: DC

## 2023-12-27 NOTE — Anesthesia Postprocedure Evaluation (Signed)
 Anesthesia Post Note  Patient: Heidi [name]  Procedure(s) Performed: MINIMALLY INVASIVE (MIS) TRANSFORAMINAL LUMBAR INTERBODY FUSION (TLIF) 2 LEVEL (Back) APPLICATION OF INTRAOPERATIVE CT SCAN (Back)  Patient location during evaluation: PACU Anesthesia Type: General Level of consciousness: awake and alert Pain management: pain level controlled Vital Signs Assessment: post-procedure vital signs reviewed and stable Respiratory status: spontaneous breathing, nonlabored ventilation, respiratory function stable and patient connected to nasal cannula oxygen Cardiovascular status: blood pressure returned to baseline and stable Postop Assessment: no apparent nausea or vomiting Anesthetic complications: no   No notable events documented.   Last Vitals:  Vitals:   12/27/23 0314 12/27/23 0730  BP: 113/61 (!) 84/45  Pulse: 69 64  Resp: 18 18  Temp: 37.2 C 36.8 C  SpO2: 97% 98%    Last Pain:  Vitals:   12/27/23 0523  TempSrc:   PainSc: 3                  Debby Mines

## 2023-12-27 NOTE — Progress Notes (Signed)
 Neurosurgery Progress Note  History: Heidi [name] is here for spondylolisthesis and low back pain.  POD2: Continues to get lightheaded.   POD1: Some lightheadedness.  Pain under reasonable control.  Physical Exam: Vitals:   12/27/23 0730 12/27/23 1034  BP: (!) 84/45 112/66  Pulse: 64 66  Resp: 18 16  Temp: 98.3 F (36.8 C) 98.4 F (36.9 C)  SpO2: 98% 100%    AA Ox3 CNI  Strength:5/5 throughout BLE  Dressing c/d/I  Data:  Other tests/results: drain 70 yesterday  Assessment/Plan:  Heidi [name] is doing well after L4-S1 MIS TLIF.  - mobilize - having trouble due to lightheadedness - pain control - DVT prophylaxis - PTOT - Monitor drain output   Reeves Daisy MD, [facility] Department of Neurosurgery

## 2023-12-27 NOTE — Progress Notes (Signed)
 PT Cancellation Note  Patient Details Name: Heidi [name] MRN: [id number] DOB: 01/18/1975   Cancelled Treatment:     Pt had just returned from bathroom with nursing, received pain meds, and currently heavily sleeping in dark room. Will re-attempt at a later date per POC.    Darice JAYSON Bohr 12/27/2023, 2:43 PM

## 2023-12-27 NOTE — Progress Notes (Signed)
 Overnight update note:   Some hypotension noted while seated at bedside. Patient seen to have some low pressures throughout the day. Felt dizzy.  Review of chart shows limited PO intake today.   Plan:  -NS Bolus -Restart MIVF -Hold Zanaflex , currently on hold until 12:00 tomorrow  -Reevaluate after fluids administered

## 2023-12-27 NOTE — Plan of Care (Signed)
°  Problem: Education: Goal: Knowledge of General Education information will improve Description: Including pain rating scale, medication(s)/side effects and non-pharmacologic comfort measures Outcome: Progressing   Problem: Health Behavior/Discharge Planning: Goal: Ability to manage health-related needs will improve Outcome: Progressing   Problem: Clinical Measurements: Goal: Ability to maintain clinical measurements within normal limits will improve Outcome: Progressing Goal: Will remain free from infection Outcome: Progressing Goal: Diagnostic test results will improve Outcome: Progressing Goal: Respiratory complications will improve Outcome: Progressing Goal: Cardiovascular complication will be avoided Outcome: Progressing   Problem: Nutrition: Goal: Adequate nutrition will be maintained Outcome: Progressing   Problem: Coping: Goal: Level of anxiety will decrease Outcome: Progressing   Problem: Elimination: Goal: Will not experience complications related to bowel motility Outcome: Progressing Goal: Will not experience complications related to urinary retention Outcome: Progressing   Problem: Pain Managment: Goal: General experience of comfort will improve and/or be controlled Outcome: Progressing   Problem: Safety: Goal: Ability to remain free from injury will improve Outcome: Progressing   Problem: Skin Integrity: Goal: Risk for impaired skin integrity will decrease Outcome: Progressing   Problem: Education: Goal: Ability to verbalize activity precautions or restrictions will improve Outcome: Progressing Goal: Knowledge of the prescribed therapeutic regimen will improve Outcome: Progressing Goal: Understanding of discharge needs will improve Outcome: Progressing   Problem: Clinical Measurements: Goal: Ability to maintain clinical measurements within normal limits will improve Outcome: Progressing Goal: Postoperative complications will be avoided or  minimized Outcome: Progressing Goal: Diagnostic test results will improve Outcome: Progressing   Problem: Bowel/Gastric: Goal: Gastrointestinal status for postoperative course will improve Outcome: Progressing   Problem: Pain Management: Goal: Pain level will decrease Outcome: Progressing   Problem: Bladder/Genitourinary: Goal: Urinary functional status for postoperative course will improve Outcome: Progressing   Problem: Health Behavior/Discharge Planning: Goal: Identification of resources available to assist in meeting health care needs will improve Outcome: Progressing

## 2023-12-28 DIAGNOSIS — Z833 Family history of diabetes mellitus: Secondary | ICD-10-CM | POA: Diagnosis not present

## 2023-12-28 DIAGNOSIS — M4316 Spondylolisthesis, lumbar region: Secondary | ICD-10-CM | POA: Diagnosis present

## 2023-12-28 DIAGNOSIS — M532X6 Spinal instabilities, lumbar region: Secondary | ICD-10-CM | POA: Diagnosis present

## 2023-12-28 DIAGNOSIS — M48061 Spinal stenosis, lumbar region without neurogenic claudication: Secondary | ICD-10-CM | POA: Diagnosis present

## 2023-12-28 DIAGNOSIS — M4317 Spondylolisthesis, lumbosacral region: Secondary | ICD-10-CM | POA: Diagnosis present

## 2023-12-28 DIAGNOSIS — F17210 Nicotine dependence, cigarettes, uncomplicated: Secondary | ICD-10-CM | POA: Diagnosis present

## 2023-12-28 DIAGNOSIS — M5441 Lumbago with sciatica, right side: Secondary | ICD-10-CM | POA: Diagnosis present

## 2023-12-28 DIAGNOSIS — Z8041 Family history of malignant neoplasm of ovary: Secondary | ICD-10-CM | POA: Diagnosis not present

## 2023-12-28 DIAGNOSIS — G8929 Other chronic pain: Secondary | ICD-10-CM | POA: Diagnosis present

## 2023-12-28 DIAGNOSIS — M545 Low back pain, unspecified: Secondary | ICD-10-CM | POA: Diagnosis present

## 2023-12-28 DIAGNOSIS — I951 Orthostatic hypotension: Secondary | ICD-10-CM | POA: Diagnosis not present

## 2023-12-28 DIAGNOSIS — M7138 Other bursal cyst, other site: Secondary | ICD-10-CM | POA: Diagnosis present

## 2023-12-28 DIAGNOSIS — M5442 Lumbago with sciatica, left side: Secondary | ICD-10-CM | POA: Diagnosis present

## 2023-12-28 DIAGNOSIS — M51379 Other intervertebral disc degeneration, lumbosacral region without mention of lumbar back pain or lower extremity pain: Secondary | ICD-10-CM | POA: Diagnosis present

## 2023-12-28 DIAGNOSIS — Z888 Allergy status to other drugs, medicaments and biological substances status: Secondary | ICD-10-CM | POA: Diagnosis not present

## 2023-12-28 DIAGNOSIS — Z800 Family history of malignant neoplasm of digestive organs: Secondary | ICD-10-CM | POA: Diagnosis not present

## 2023-12-28 DIAGNOSIS — Z8049 Family history of malignant neoplasm of other genital organs: Secondary | ICD-10-CM | POA: Diagnosis not present

## 2023-12-28 DIAGNOSIS — G2581 Restless legs syndrome: Secondary | ICD-10-CM | POA: Diagnosis present

## 2023-12-28 DIAGNOSIS — Z79899 Other long term (current) drug therapy: Secondary | ICD-10-CM | POA: Diagnosis not present

## 2023-12-28 DIAGNOSIS — Z8052 Family history of malignant neoplasm of bladder: Secondary | ICD-10-CM | POA: Diagnosis not present

## 2023-12-28 MED ORDER — DEXAMETHASONE SODIUM PHOSPHATE 4 MG/ML IJ SOLN
4.0000 mg | Freq: Four times a day (QID) | INTRAMUSCULAR | Status: AC
  Administered 2023-12-28 – 2023-12-29 (×4): 4 mg via INTRAVENOUS
  Filled 2023-12-28 (×4): qty 1

## 2023-12-28 NOTE — Progress Notes (Signed)
 Occupational Therapy Treatment Patient Details Name: Serita Degroote MRN: [id number] DOB: 25-May-1975 Today's Date: 12/28/2023   History of present illness Pt is a 48 y/o F admitted on 12/25/23 for scheduled L4-S1 TLIF. PMH: anxiety, asthma, depression, clotting disorder, lupus, mitral valve prolapse, RLS   OT comments  Pt seen for OT tx. Fiance present and supportive. Pt endorsing some RLE discomfort which she was unable to minimize in sitting. Pt notes it improves with standing. Pt agreeable to grooming tasks at the sink. Pt required set up at sink with items and a cup for rinsing/spitting after teethbrushing to adhere to back precautions. No VC needed to maintain precautions. Pt/spouse educated in countertop positioning of items, RW use in bathroom vs kitchen. Pt/spouse verbalized understanding. Pt demo's good adherence to back precautions while completing bed mobility using log roll technique. Left laying on L side, all needs in reach. Pt progressing well towards goals.       If plan is discharge home, recommend the following:  A little help with walking and/or transfers;A little help with bathing/dressing/bathroom;Assistance with cooking/housework;Assist for transportation;Help with stairs or ramp for entrance   Equipment Recommendations  BSC/3in1;Other (comment) (2WW)    Recommendations for Other Services      Precautions / Restrictions Precautions Precautions: Fall;Back Precaution Booklet Issued: Yes (comment) Recall of Precautions/Restrictions: Intact Required Braces or Orthoses: Spinal Brace Spinal Brace: Lumbar corset;Applied in sitting position Restrictions Weight Bearing Restrictions Per Provider Order: No       Mobility Bed Mobility Overal bed mobility: Modified Independent, Needs Assistance Bed Mobility: Sit to Sidelying, Rolling Rolling: Modified independent (Device/Increase time)       Sit to sidelying: Modified independent (Device/Increase time) General bed  mobility comments: excellent adherence to back precautions and use of log roll technique    Transfers Overall transfer level: Needs assistance Equipment used: Rolling walker (2 wheels) Transfers: Sit to/from Stand Sit to Stand: Supervision           General transfer comment: VC for hand placement     Balance Overall balance assessment: Mild deficits observed, not formally tested          ADL either performed or assessed with clinical judgement   ADL Overall ADL's : Needs assistance/impaired     Grooming: Standing;Wash/dry face;Wash/dry hands;Oral care;Set up;Supervision/safety Grooming Details (indicate cue type and reason): Set up sink with cup for rinsing/spitting after teethbrushing to adhere to back precautions. No VC needed to maintain precautions.                  Communication Communication Communication: No apparent difficulties   Cognition Arousal: Alert Behavior During Therapy: WFL for tasks assessed/performed Cognition: No apparent impairments        Following commands: Intact        Cueing   Cueing Techniques: Verbal cues  Exercises Other Exercises Other Exercises: Pt/spouse educated in countertop positioning of items, RW use in bathroom vs kitchen       General Comments  (Surgical incision dressings intact and dry)    Pertinent Vitals/ Pain       Pain Assessment Pain Assessment: Faces Faces Pain Scale: Hurts little more Pain Location: RLE, back Pain Descriptors / Indicators: Sore, Discomfort, Aching, Grimacing Pain Intervention(s): Monitored during session, Repositioned, Patient requesting pain meds-RN notified   Frequency  Min 2X/week        Progress Toward Goals  OT Goals(current goals can now be found in the care plan section)  Progress towards OT goals: Progressing toward  goals  Acute Rehab OT Goals Patient Stated Goal: get better and return to working OT Goal Formulation: With patient Time For Goal Achievement:  01/09/24 Potential to Achieve Goals: Good   AM-PAC OT 6 Clicks Daily Activity     Outcome Measure   Help from another person eating meals?: None Help from another person taking care of personal grooming?: None Help from another person toileting, which includes using toliet, bedpan, or urinal?: A Little Help from another person bathing (including washing, rinsing, drying)?: A Little Help from another person to put on and taking off regular upper body clothing?: None Help from another person to put on and taking off regular lower body clothing?: A Little 6 Click Score: 21    End of Session Equipment Utilized During Treatment: Rolling walker (2 wheels)  OT Visit Diagnosis: Other abnormalities of gait and mobility (R26.89)   Activity Tolerance Patient tolerated treatment well   Patient Left in bed;with call bell/phone within reach;with family/visitor present   Nurse Communication Patient requests pain meds        Time: 1319-1340 OT Time Calculation (min): 21 min  Charges: OT General Charges $OT Visit: 1 Visit OT Treatments $Self Care/Home Management : 8-22 mins  Warren SAUNDERS., MPH, MS, OTR/L ascom [phone] 12/28/2023, 1:50 PM

## 2023-12-28 NOTE — Progress Notes (Signed)
 Neurosurgery Progress Note  History: Heidi [name] is here for spondylolisthesis and low back pain.  POD3: Headaches better but still there. POD2: Continues to get lightheaded.   POD1: Some lightheadedness.  Pain under reasonable control.  Physical Exam: Vitals:   12/28/23 0236 12/28/23 0628  BP: 113/77 (!) 107/57  Pulse: 62 65  Resp: 18 18  Temp: 98.4 F (36.9 C) 98.2 F (36.8 C)  SpO2: 100% 99%    AA Ox3 CNI  Strength:5/5 throughout BLE  Dressing c/d/I  Data:  Other tests/results: drain 60 yesterday  Assessment/Plan:  Heidi [name] is doing well after L4-S1 MIS TLIF.  - mobilize - having trouble due to lightheadedness - pain control - DVT prophylaxis - PTOT - Remove drain - trial of dexamethasone  - stop IVF   Reeves Daisy MD, [facility] Department of Neurosurgery

## 2023-12-28 NOTE — Plan of Care (Signed)

## 2023-12-28 NOTE — Progress Notes (Signed)
 Physical Therapy Treatment Patient Details Name: Heidi [name] MRN: [id number] DOB: [birth year] Today's Date: 12/28/2023   History of Present Illness Pt is a 48 y/o F admitted on 12/25/23 for scheduled L4-S1 TLIF. PMH: anxiety, asthma, depression, clotting disorder, lupus, mitral valve prolapse, RLS    PT Comments  Pt feeling much better today after resting yesterday. Good demonstration of spinal precautions while transitioning out of bed. Sit<>stand with SBA and cues for technique and LSO applied in sitting. Pt completed gait training in hall with support of RW, no LOB or c/o dizziness with positional changes. Minimal c/o pain. Pt educated on positioning adjustments in sitting to relieve sacral discomfort. Overall, great improvement functionally. Will practice stairs prior to d/c home. RW and BSC have been delivered to room.    If plan is discharge home, recommend the following: A little help with walking and/or transfers;A little help with bathing/dressing/bathroom;Assistance with cooking/housework;Assist for transportation;Help with stairs or ramp for entrance   Can travel by private vehicle        Equipment Recommendations  Rolling walker (2 wheels);BSC/3in1    Recommendations for Other Services       Precautions / Restrictions Precautions Precautions: Fall;Back Precaution Booklet Issued: Yes (comment) (Pt given handout regarding spinal precautions) Recall of Precautions/Restrictions: Intact Required Braces or Orthoses: Spinal Brace Spinal Brace: Lumbar corset;Applied in sitting position Restrictions Weight Bearing Restrictions Per Provider Order: No     Mobility  Bed Mobility Overal bed mobility: Needs Assistance Bed Mobility: Rolling, Sidelying to Sit Rolling: Modified independent (Device/Increase time) (Increased time) Sidelying to sit: Modified independent (Device/Increase time), HOB elevated (Increased time)       General bed mobility comments:  (Pt states she has  been sitting EOB during the day)    Transfers Overall transfer level: Needs assistance Equipment used: Rolling walker (2 wheels) Transfers: Sit to/from Stand Sit to Stand: Contact guard assist, Supervision           General transfer comment:  (LSO applied in sitting with MinA)    Ambulation/Gait Ambulation/Gait assistance: Contact guard assist Gait Distance (Feet): 100 Feet Assistive device: Rolling walker (2 wheels) Gait Pattern/deviations: Decreased step length - right, Decreased step length - left, Decreased stride length Gait velocity: decreased     General Gait Details:  (Slow steady cadence with RW, no LOB or increased pain.)   Stairs             Wheelchair Mobility     Tilt Bed    Modified Rankin (Stroke Patients Only)       Balance Overall balance assessment: Needs assistance Sitting-balance support: Feet supported Sitting balance-Leahy Scale: Good     Standing balance support: Bilateral upper extremity supported, During functional activity, Reliant on assistive device for balance Standing balance-Leahy Scale: Fair Standing balance comment:  (Reliant on RW to offload back pain)                            Communication Communication Communication: No apparent difficulties  Cognition Arousal: Alert Behavior During Therapy: WFL for tasks assessed/performed   PT - Cognitive impairments: No apparent impairments                         Following commands: Intact      Cueing Cueing Techniques: Verbal cues  Exercises Other Exercises Other Exercises: Pt educated on back precautions and given reference handout. Discussed importance of OOB activity, positional changes for  comfort, and safe transition home upon d/c.    General Comments General comments (skin integrity, edema, etc.):  (Surgical incision dressings intact and dry)      Pertinent Vitals/Pain Pain Assessment Pain Assessment: 0-10 Faces Pain Scale: Hurts  little more Pain Location:  (Lower back/sacrum) Pain Descriptors / Indicators: Sore, Discomfort Pain Intervention(s): Monitored during session, Repositioned    Home Living                          Prior Function            PT Goals (current goals can now be found in the care plan section) Acute Rehab PT Goals Patient Stated Goal: get better, recover s/p sx Progress towards PT goals: Progressing toward goals    Frequency    7X/week      PT Plan      Co-evaluation              AM-PAC PT 6 Clicks Mobility   Outcome Measure  Help needed turning from your back to your side while in a flat bed without using bedrails?: A Little Help needed moving from lying on your back to sitting on the side of a flat bed without using bedrails?: A Little Help needed moving to and from a bed to a chair (including a wheelchair)?: A Little Help needed standing up from a chair using your arms (e.g., wheelchair or bedside chair)?: A Little Help needed to walk in hospital room?: A Little Help needed climbing 3-5 steps with a railing? : A Little 6 Click Score: 18    End of Session Equipment Utilized During Treatment: Gait belt Activity Tolerance: Patient tolerated treatment well Patient left: in chair;with chair alarm set;with call bell/phone within reach Nurse Communication: Mobility status PT Visit Diagnosis: Muscle weakness (generalized) (M62.81);Difficulty in walking, not elsewhere classified (R26.2);Other abnormalities of gait and mobility (R26.89);Unsteadiness on feet (R26.81);Pain Pain - part of body:  (Lower back)     Time: 8859-8778 PT Time Calculation (min) (ACUTE ONLY): 41 min  Charges:    $Gait Training: 8-22 mins $Therapeutic Activity: 23-37 mins PT General Charges $$ ACUTE PT VISIT: 1 Visit                    Darice Bohr, PTA  Darice JAYSON Bohr 12/28/2023, 12:55 PM

## 2023-12-29 ENCOUNTER — Other Ambulatory Visit: Payer: Self-pay

## 2023-12-29 MED ORDER — OXYCODONE HCL 5 MG PO TABS
5.0000 mg | ORAL_TABLET | ORAL | 0 refills | Status: AC | PRN
  Filled 2023-12-29: qty 40, 4d supply, fill #0

## 2023-12-29 MED ORDER — CELECOXIB 200 MG PO CAPS
200.0000 mg | ORAL_CAPSULE | Freq: Two times a day (BID) | ORAL | 0 refills | Status: DC
  Filled 2023-12-29: qty 90, 45d supply, fill #0

## 2023-12-29 MED ORDER — BUTALBITAL-APAP-CAFFEINE 50-325-40 MG PO TABS
1.0000 | ORAL_TABLET | ORAL | 0 refills | Status: AC | PRN
  Filled 2023-12-29: qty 14, 3d supply, fill #0

## 2023-12-29 MED ORDER — TIZANIDINE HCL 4 MG PO TABS
4.0000 mg | ORAL_TABLET | Freq: Four times a day (QID) | ORAL | 0 refills | Status: AC | PRN
  Filled 2023-12-29: qty 120, 30d supply, fill #0

## 2023-12-29 MED ORDER — POLYETHYLENE GLYCOL 3350 17 GM/SCOOP PO POWD
17.0000 g | Freq: Every day | ORAL | 0 refills | Status: DC | PRN
  Filled 2023-12-29: qty 238, 14d supply, fill #0

## 2023-12-29 NOTE — Plan of Care (Signed)

## 2023-12-29 NOTE — Plan of Care (Signed)
  Problem: Education: Goal: Knowledge of General Education information will improve Description: Including pain rating scale, medication(s)/side effects and non-pharmacologic comfort measures Outcome: Progressing   Problem: Clinical Measurements: Goal: Ability to maintain clinical measurements within normal limits will improve Outcome: Progressing   Problem: Activity: Goal: Risk for activity intolerance will decrease Outcome: Progressing   Problem: Coping: Goal: Level of anxiety will decrease Outcome: Progressing   Problem: Elimination: Goal: Will not experience complications related to urinary retention Outcome: Progressing   Problem: Pain Managment: Goal: General experience of comfort will improve and/or be controlled Outcome: Progressing   Problem: Safety: Goal: Ability to remain free from injury will improve Outcome: Progressing   Problem: Skin Integrity: Goal: Risk for impaired skin integrity will decrease Outcome: Progressing

## 2023-12-29 NOTE — Progress Notes (Signed)
 Neurosurgery Progress Note  History: Heidi [name] is here for spondylolisthesis and low back pain.  POD4: Doing much better.   POD3: Headaches better but still there. POD2: Continues to get lightheaded.   POD1: Some lightheadedness.  Pain under reasonable control.  Physical Exam: Vitals:   12/29/23 0444 12/29/23 0752  BP: 134/70 128/72  Pulse: (!) 52 (!) 53  Resp: 17 18  Temp: 98.2 F (36.8 C) 98.4 F (36.9 C)  SpO2: 97% 96%    AA Ox3 CNI  Strength:5/5 throughout BLE  Dressing c/d/I  Data:  Other tests/results: drain out  Assessment/Plan:  Heidi [name] is doing well after L4-S1 MIS TLIF.  - mobilize  - pain control - DVT prophylaxis - PTOT     Reeves Daisy MD, [facility] Department of Neurosurgery

## 2023-12-29 NOTE — Discharge Summary (Signed)
 Physician Discharge Summary  Patient ID: Heidi [name] MRN: [id number] DOB/AGE: 50-May-1977 50 y.o.  Admit date: 12/25/2023 Discharge date: 12/29/2023  Admission Diagnoses: Principal Problem:   S/P lumbar fusion Active Problems:   Spondylolisthesis of lumbar region   Synovial cyst of lumbar spine   Chronic bilateral low back pain with bilateral sciatica   Spinal instability, lumbar  Discharge Diagnoses:  Principal Problem:   S/P lumbar fusion Active Problems:   Spondylolisthesis of lumbar region   Synovial cyst of lumbar spine   Chronic bilateral low back pain with bilateral sciatica   Spinal instability, lumbar   Discharged Condition: good  Hospital Course: Heidi [name] was admitted for low back surgery.  She did well but had trouble mobilizing due to feeling faint and orthostatic hypotension.  This resolved over time allowing her to clear PTOT on POD4.  Consults: None  Significant Diagnostic Studies: radiology: X-Ray: localization of implants  Treatments: surgery: L4-S1 minimally invasive TLIF  Discharge Exam: Blood pressure 128/72, pulse (!) 53, temperature 98.4 F (36.9 C), resp. rate 18, height 5' 8 (1.727 m), weight 83.5 kg, last menstrual period 12/24/2023, SpO2 96%. General appearance: alert and cooperative CNI MAEW Dressing c/d/i  Disposition: Discharge disposition: 01-Home or Self Care       Discharge Instructions     Discharge patient   Complete by: As directed    Discharge disposition: 01-Home or Self Care   Discharge patient date: 12/29/2023   Incentive spirometry RT   Complete by: As directed       Allergies as of 12/29/2023       Reactions   Antihistamines, Chlorpheniramine-type    ALL antihistamines aggravate restless leg syndrome   Sleep Aid [diphenhydramine Hcl]    ALL sleep aids aggravate restless leg syndrome   Zyprexa [olanzapine]    Aggravates restless legs        Medication List     STOP taking these medications     diclofenac  75 MG EC tablet Commonly known as: VOLTAREN    senna-docusate 8.6-50 MG tablet Commonly known as: Senokot-S       TAKE these medications    butalbital -acetaminophen -caffeine  50-325-40 MG tablet Commonly known as: FIORICET  Take 1 tablet by mouth every 4 (four) hours as needed for headache.   celecoxib  200 MG capsule Commonly known as: CELEBREX  Take 1 capsule (200 mg total) by mouth 2 (two) times daily.   chlorhexidine  4 % external liquid Commonly known as: HIBICLENS  Apply 15 mLs (1 Application total) topically as directed for 30 doses. Use as directed daily for 5 days every other week for 6 weeks.   cyanocobalamin 1000 MCG tablet Commonly known as: VITAMIN B12 Take 1,000 mcg by mouth in the morning and at bedtime.   gabapentin  300 MG capsule Commonly known as: NEURONTIN  Take 300mg  in the morning and 600mg  at night   lamoTRIgine  200 MG tablet Commonly known as: LAMICTAL  Take 200 mg by mouth every morning.   mupirocin  ointment 2 % Commonly known as: BACTROBAN  Place 1 Application into the nose 2 (two) times daily for 60 doses. Use as directed 2 times daily for 5 days every other week for 6 weeks.   oxyCODONE  5 MG immediate release tablet Commonly known as: Oxy IR/ROXICODONE  Take 1-2 tablets (5-10 mg total) by mouth every 4 (four) hours as needed for moderate pain (pain score 4-6). What changed:  how much to take when to take this reasons to take this   polyethylene glycol 17 g packet Commonly known as: MIRALAX  /  GLYCOLAX  Take 17 g by mouth daily as needed for mild constipation.   sertraline  50 MG tablet Commonly known as: ZOLOFT  Take 50 mg by mouth daily.   tiZANidine  4 MG tablet Commonly known as: ZANAFLEX  Take 1 tablet (4 mg total) by mouth every 6 (six) hours as needed for muscle spasms. What changed: See the new instructions.   valACYclovir  1000 MG tablet Commonly known as: VALTREX  TAKE 1 TABLET BY MOUTH EVERY DAY                Durable Medical Equipment  (From admission, onward)           Start     Ordered   12/26/23 0956  For home use only DME Walker rolling  Once       Question Answer Comment  Walker: With 5 Inch Wheels   Patient needs a walker to treat with the following condition Spondylolisthesis of lumbar region      12/26/23 0956   12/26/23 0954  For home use only DME Bedside commode  Once       Question Answer Comment  Patient needs a bedside commode to treat with the following condition Lumbago   Patient needs a bedside commode to treat with the following condition Spondylolisthesis of lumbar region      12/26/23 0956            Contact information for after-discharge care     Home Medical Care     [facility]) .   Service: Home Health Services Contact information: [address] [place]  [address] [phone]                     Signed: REEVES DAISY 12/29/2023, 9:46 AM

## 2023-12-29 NOTE — Progress Notes (Signed)
 "     Established patient visit   Patient: Heidi [name]   DOB: 11-Oct-1975   48 y.o. Female  MRN: [id number] Visit Date: 12/18/2023  Today's healthcare provider: Nancyann Perry, MD   Chief Complaint  Patient presents with   Pre-op Exam    Patient had pre-admission testing done. She had an EKG. labs done today.   Subjective    Discussed the use of AI scribe software for clinical note transcription with the patient, who gave verbal consent to proceed.  History of Present Illness   Heidi [name] is a 48 year old female with mitral valve prolapse who presents for preoperative evaluation.  She is scheduled for surgery next Monday and has completed preoperative labs and an EKG at [facility]. She has a history of mitral valve prolapse, associated with chest pains for over thirty years. Recently, there has been a slight increase in these chest pains, particularly when in certain resting positions, such as lying on her back at night. The pain is described as a dull ache that resolves upon changing positions.  She has no history of complications from previous surgeries or anesthesia. Her past surgeries include a tonsillectomy, septoplasty for a deviated septum, and tubal ligation, all without complications. There is no family history of adverse reactions to anesthesia.  She experiences mild nasal congestion at night, which she attributes to possible drainage or the recent switch to using heating. No outward drainage or significant respiratory symptoms are reported.  She quit smoking nearly two months ago. She reports mild chest pain that has been present for over thirty years, with a slight recent increase.       Medications: Show/hide medication list[1] Review of Systems     Objective    BP 125/74 (BP Location: Left Arm, Patient Position: Sitting, Cuff Size: Normal)   Pulse 61   Temp 98 F (36.7 C) (Oral)   Resp 16   Ht 5' 8 (1.727 m)   Wt 186 lb 3.2 oz (84.5 kg)   LMP 11/23/2023  (Exact Date)   SpO2 100%   BMI 28.31 kg/m   Physical Exam   General: Appearance:    Well developed, well nourished female in no acute distress  Eyes:    PERRL, conjunctiva/corneas clear, EOM's intact       Lungs:     Clear to auscultation bilaterally, respirations unlabored  Heart:    Normal heart rate. Normal rhythm. No murmurs, rubs, or gallops.    MS:   All extremities are intact.    Neurologic:   Awake, alert, oriented x 3. No apparent focal neurological defect.          Assessment & Plan        Preoperative Evaluation for Scheduled Surgery Preoperative labs and EKG normal. No history of surgical or anesthesia complications. No family history of anesthesia reactions. Chest pains due to mitral valve prolapse, not alarming. Mild nasal congestion likely environmental. Smoking cessation achieved. - Sent preoperative evaluation note to Dr. Perry.  Mitral Valve Prolapse Chronic mitral valve prolapse with intermittent chest pains. Recent increase in frequency, consistent with previous episodes, not alarming.  History of Tobacco Use Tobacco use with successful cessation two months ago. - Continue to abstain from smoking.     Spondylolisthesis of lumbar region (Primary)   Synovial cyst of lumbar spine        Nancyann Perry, MD  [facility] [phone] (phone) [phone] (fax)  [facility]     [  1]  Outpatient Medications Prior to Visit  Medication Sig   cyanocobalamin (VITAMIN B12) 1000 MCG tablet Take 1,000 mcg by mouth in the morning and at bedtime.   gabapentin  (NEURONTIN ) 300 MG capsule Take 300mg  in the morning and 600mg  at night   lamoTRIgine  (LAMICTAL ) 200 MG tablet Take 200 mg by mouth every morning.   sertraline  (ZOLOFT ) 50 MG tablet Take 50 mg by mouth daily.   valACYclovir  (VALTREX ) 1000 MG tablet TAKE 1 TABLET BY MOUTH EVERY DAY   [DISCONTINUED] diclofenac  (VOLTAREN ) 75 MG EC tablet TAKE 1 TABLET BY MOUTH TWICE  A DAY AS NEEDED WITH FOOD (Patient taking differently: Take 75 mg by mouth 2 (two) times daily.)   [DISCONTINUED] oxyCODONE  (ROXICODONE ) 5 MG immediate release tablet Take 0.5-1 tablets (2.5-5 mg total) by mouth every 6 (six) hours as needed for severe pain (pain score 7-10).   [DISCONTINUED] senna-docusate (SENOKOT-S) 8.6-50 MG tablet Take 1 tablet by mouth daily as needed for mild constipation.   [DISCONTINUED] tiZANidine  (ZANAFLEX ) 4 MG tablet TAKE 1/2 TO 1 TABLET (2-4 MG TOTAL) BY MOUTH EVERY 8 HOURS AS NEEDED   No facility-administered medications prior to visit.   "

## 2024-01-02 ENCOUNTER — Telehealth: Payer: Self-pay | Admitting: Neurosurgery

## 2024-01-02 NOTE — Telephone Encounter (Signed)
 Heidi [name] with [facility] called to request verbal PT orders of 2x4 and 1x4. Verbal provided by me, okay per Almarie Pouch.

## 2024-01-02 NOTE — Progress Notes (Signed)
" ° °  REFERRING PHYSICIAN:  Gasper Nancyann BRAVO, Md [address] [place],  [place] [address]  DOS: 12/25/23  PSF L4-S1 with MIS TLIF  HISTORY OF PRESENT ILLNESS: Heidi [name] is approximately 2 weeks status post above surgery. Was given fioricet , celebrex , oxycodone , zanaflex  on discharge from the hospital.   She is feeling much better than she did preop- her LBP and bilateral leg pain is better! Nerve pain in legs is gone, still has some muscle pain. She is not dragging her left foot/leg. Was having some pain in right hip that is improving.   She is taking oxycodone  prn (usually at night), zanaflex , celebrex , and robaxin.   Preop nasal swab was positive for staph- patient is to continue mupirocin  and chloraprep x 6 weeks.    PHYSICAL EXAMINATION:  General: Patient is well developed, well nourished, calm, collected, and in no apparent distress.   NEUROLOGICAL:  General: In no acute distress.   Awake, alert, oriented to person, place, and time.  Pupils equal round and reactive to light.  Facial tone is symmetric.     Strength:            Side Iliopsoas Quads Hamstring PF DF EHL  R 5 5 5 5 5 5   L 5 5 5 5 5 5    Incisions c/d/i   ROS (Neurologic):  Negative except as noted above  IMAGING: Nothing new to review.   ASSESSMENT/PLAN:  Heidi [name] is doing well s/p above surgery. Treatment options reviewed with patient and following plan made:   - I have advised the patient to lift up to 10 pounds until 6 weeks after surgery (follow up with Dr. Clois).  - Reviewed wound care.  - No bending, twisting, or lifting.  - Continue on current medications including prn oxycodone  and robaxin.   - Preop nasal swab was positive for staph- patient is to continue mupirocin  and chloraprep x 6 weeks.  - Follow up as scheduled in 4 weeks and prn.   Advised to contact the office if any questions or concerns arise.  Glade Boys PA-C Department of neurosurgery "

## 2024-01-02 NOTE — Telephone Encounter (Signed)
 noted

## 2024-01-05 ENCOUNTER — Encounter: Payer: Self-pay | Admitting: Family Medicine

## 2024-01-05 ENCOUNTER — Encounter: Payer: Self-pay | Admitting: Neurosurgery

## 2024-01-08 ENCOUNTER — Ambulatory Visit: Admitting: Orthopedic Surgery

## 2024-01-08 ENCOUNTER — Encounter: Payer: Self-pay | Admitting: Orthopedic Surgery

## 2024-01-08 VITALS — BP 114/70 | Temp 98.6°F | Ht 68.0 in | Wt 185.0 lb

## 2024-01-08 DIAGNOSIS — M4316 Spondylolisthesis, lumbar region: Secondary | ICD-10-CM

## 2024-01-08 DIAGNOSIS — Z981 Arthrodesis status: Secondary | ICD-10-CM

## 2024-01-23 ENCOUNTER — Encounter: Payer: Self-pay | Admitting: Family Medicine

## 2024-01-30 ENCOUNTER — Other Ambulatory Visit: Payer: Self-pay | Admitting: Neurosurgery

## 2024-01-30 DIAGNOSIS — Z981 Arthrodesis status: Secondary | ICD-10-CM

## 2024-01-31 ENCOUNTER — Encounter: Payer: Self-pay | Admitting: Neurosurgery

## 2024-01-31 NOTE — Telephone Encounter (Signed)
 DOS: 12/25/23  PSF L4-S1 with MIS TLIF

## 2024-02-06 ENCOUNTER — Encounter: Payer: Self-pay | Admitting: Neurosurgery

## 2024-02-06 ENCOUNTER — Ambulatory Visit: Admitting: Neurosurgery

## 2024-02-06 ENCOUNTER — Ambulatory Visit

## 2024-02-06 VITALS — BP 122/84 | Temp 98.4°F | Ht 67.5 in | Wt 183.5 lb

## 2024-02-06 DIAGNOSIS — Z09 Encounter for follow-up examination after completed treatment for conditions other than malignant neoplasm: Secondary | ICD-10-CM

## 2024-02-06 DIAGNOSIS — Z981 Arthrodesis status: Secondary | ICD-10-CM | POA: Diagnosis not present

## 2024-02-06 DIAGNOSIS — M4316 Spondylolisthesis, lumbar region: Secondary | ICD-10-CM

## 2024-02-06 MED ORDER — CELECOXIB 200 MG PO CAPS: 200.0000 mg | ORAL_CAPSULE | Freq: Two times a day (BID) | ORAL | 0 refills | Status: AC

## 2024-02-06 NOTE — Progress Notes (Signed)
" ° °  REFERRING PHYSICIAN:  Gasper Nancyann BRAVO, Md [address] [place],  [place] [address]  DOS: 12/25/23  PSF L4-S1 with MIS TLIF  HISTORY OF PRESENT ILLNESS: Heidi [name] is status post above surgery.  She is doing extremely well.  She has almost no pain.  She is way ahead of where she was before surgery.  She welcomed her first grandchild last Friday.   PHYSICAL EXAMINATION:  General: Patient is well developed, well nourished, calm, collected, and in no apparent distress.   NEUROLOGICAL:  General: In no acute distress.   Awake, alert, oriented to person, place, and time.  Pupils equal round and reactive to light.  Facial tone is symmetric.     Strength:            Side Iliopsoas Quads Hamstring PF DF EHL  R 5 5 5 5 5 5   L 5 5 5 5 5 5    Incisions c/d/i   ROS (Neurologic):  Negative except as noted above  IMAGING: No complications noted  ASSESSMENT/PLAN:  Heidi [name] is doing well s/p above surgery.   We discussed her activity limitations.  I am very pleased with her improvements.  She will continue Celebrex  for now and then switch back to diclofenac  after her next appointment.  She should be able to return to work without restrictions after her next appointment.   Heidi Daisy MD Department of neurosurgery "

## 2024-02-08 ENCOUNTER — Encounter: Payer: Self-pay | Admitting: Neurosurgery

## 2024-02-08 ENCOUNTER — Other Ambulatory Visit: Payer: Self-pay | Admitting: Family Medicine

## 2024-02-08 DIAGNOSIS — M359 Systemic involvement of connective tissue, unspecified: Secondary | ICD-10-CM

## 2024-02-09 ENCOUNTER — Other Ambulatory Visit: Payer: Self-pay | Admitting: Physician Assistant

## 2024-03-11 ENCOUNTER — Other Ambulatory Visit

## 2024-03-11 ENCOUNTER — Encounter

## 2024-03-18 ENCOUNTER — Encounter
